# Patient Record
Sex: Female | Born: 1970 | Race: White | Hispanic: No | Smoking: Former smoker
Health system: Southern US, Community
[De-identification: ages and names within clinical notes are randomized; demographics above are authoritative.]

## PROBLEM LIST (undated history)

## (undated) DIAGNOSIS — E119 Type 2 diabetes mellitus without complications: Secondary | ICD-10-CM

## (undated) DIAGNOSIS — H543 Unqualified visual loss, both eyes: Secondary | ICD-10-CM

## (undated) DIAGNOSIS — E785 Hyperlipidemia, unspecified: Secondary | ICD-10-CM

## (undated) DIAGNOSIS — Z794 Long term (current) use of insulin: Secondary | ICD-10-CM

## (undated) DIAGNOSIS — H3500 Unspecified background retinopathy: Secondary | ICD-10-CM

## (undated) DIAGNOSIS — IMO0001 Reserved for inherently not codable concepts without codable children: Secondary | ICD-10-CM

## (undated) DIAGNOSIS — H409 Unspecified glaucoma: Secondary | ICD-10-CM

## (undated) DIAGNOSIS — C801 Malignant (primary) neoplasm, unspecified: Secondary | ICD-10-CM

## (undated) DIAGNOSIS — K3184 Gastroparesis: Secondary | ICD-10-CM

## (undated) DIAGNOSIS — I10 Essential (primary) hypertension: Secondary | ICD-10-CM

## (undated) DIAGNOSIS — G930 Cerebral cysts: Secondary | ICD-10-CM

## (undated) DIAGNOSIS — G629 Polyneuropathy, unspecified: Secondary | ICD-10-CM

## (undated) HISTORY — DX: Type 2 diabetes mellitus without complications: E11.9

## (undated) HISTORY — DX: Malignant (primary) neoplasm, unspecified: C80.1

## (undated) HISTORY — PX: EYE SURGERY: SHX253

## (undated) HISTORY — DX: Long term (current) use of insulin: Z79.4

## (undated) HISTORY — DX: Hyperlipidemia, unspecified: E78.5

## (undated) HISTORY — DX: Reserved for inherently not codable concepts without codable children: IMO0001

## (undated) HISTORY — DX: Gastroparesis: K31.84

## (undated) HISTORY — DX: Polyneuropathy, unspecified: G62.9

## (undated) HISTORY — DX: Unspecified glaucoma: H40.9

## (undated) HISTORY — DX: Unspecified background retinopathy: H35.00

## (undated) HISTORY — DX: Essential (primary) hypertension: I10

## (undated) HISTORY — DX: Unqualified visual loss, both eyes: H54.3

## (undated) HISTORY — PX: ABDOMINAL HYSTERECTOMY: SHX81

---

## 1997-11-16 ENCOUNTER — Encounter: Admission: RE | Admit: 1997-11-16 | Discharge: 1998-02-14 | Payer: Self-pay | Admitting: Obstetrics

## 1997-11-22 ENCOUNTER — Encounter: Admission: RE | Admit: 1997-11-22 | Discharge: 1998-02-20 | Payer: Self-pay | Admitting: Obstetrics & Gynecology

## 1998-01-02 ENCOUNTER — Observation Stay (HOSPITAL_COMMUNITY): Admission: AD | Admit: 1998-01-02 | Discharge: 1998-01-02 | Payer: Self-pay | Admitting: Obstetrics & Gynecology

## 1998-01-18 ENCOUNTER — Ambulatory Visit (HOSPITAL_COMMUNITY): Admission: RE | Admit: 1998-01-18 | Discharge: 1998-01-18 | Payer: Self-pay | Admitting: Unknown Physician Specialty

## 1998-01-30 ENCOUNTER — Inpatient Hospital Stay (HOSPITAL_COMMUNITY): Admission: AD | Admit: 1998-01-30 | Discharge: 1998-01-30 | Payer: Self-pay | Admitting: *Deleted

## 1998-01-31 ENCOUNTER — Ambulatory Visit (HOSPITAL_COMMUNITY): Admission: RE | Admit: 1998-01-31 | Discharge: 1998-01-31 | Payer: Self-pay | Admitting: Obstetrics & Gynecology

## 1998-02-20 ENCOUNTER — Ambulatory Visit (HOSPITAL_COMMUNITY): Admission: RE | Admit: 1998-02-20 | Discharge: 1998-02-20 | Payer: Self-pay | Admitting: *Deleted

## 1998-02-21 ENCOUNTER — Encounter: Admission: RE | Admit: 1998-02-21 | Discharge: 1998-05-22 | Payer: Self-pay | Admitting: Obstetrics & Gynecology

## 1998-02-28 ENCOUNTER — Encounter: Admission: RE | Admit: 1998-02-28 | Discharge: 1998-02-28 | Payer: Self-pay | Admitting: Obstetrics & Gynecology

## 1998-03-10 ENCOUNTER — Ambulatory Visit (HOSPITAL_COMMUNITY): Admission: RE | Admit: 1998-03-10 | Discharge: 1998-03-10 | Payer: Self-pay | Admitting: Obstetrics & Gynecology

## 1998-03-14 ENCOUNTER — Encounter: Admission: RE | Admit: 1998-03-14 | Discharge: 1998-03-14 | Payer: Self-pay | Admitting: Obstetrics & Gynecology

## 1998-03-21 ENCOUNTER — Encounter: Admission: RE | Admit: 1998-03-21 | Discharge: 1998-03-21 | Payer: Self-pay | Admitting: Obstetrics & Gynecology

## 1998-03-28 ENCOUNTER — Inpatient Hospital Stay (HOSPITAL_COMMUNITY): Admission: AD | Admit: 1998-03-28 | Discharge: 1998-03-28 | Payer: Self-pay | Admitting: Obstetrics

## 1998-04-04 ENCOUNTER — Encounter: Admission: RE | Admit: 1998-04-04 | Discharge: 1998-04-04 | Payer: Self-pay | Admitting: Obstetrics & Gynecology

## 1998-04-05 ENCOUNTER — Ambulatory Visit (HOSPITAL_COMMUNITY): Admission: RE | Admit: 1998-04-05 | Discharge: 1998-04-05 | Payer: Self-pay | Admitting: Obstetrics & Gynecology

## 1998-04-18 ENCOUNTER — Encounter: Admission: RE | Admit: 1998-04-18 | Discharge: 1998-04-18 | Payer: Self-pay | Admitting: Obstetrics & Gynecology

## 1998-04-25 ENCOUNTER — Encounter: Admission: RE | Admit: 1998-04-25 | Discharge: 1998-04-25 | Payer: Self-pay | Admitting: Obstetrics & Gynecology

## 1998-05-09 ENCOUNTER — Encounter: Admission: RE | Admit: 1998-05-09 | Discharge: 1998-05-09 | Payer: Self-pay | Admitting: Obstetrics & Gynecology

## 1998-05-11 ENCOUNTER — Ambulatory Visit (HOSPITAL_COMMUNITY): Admission: RE | Admit: 1998-05-11 | Discharge: 1998-05-11 | Payer: Self-pay

## 1998-05-16 ENCOUNTER — Encounter: Admission: RE | Admit: 1998-05-16 | Discharge: 1998-05-16 | Payer: Self-pay | Admitting: Obstetrics & Gynecology

## 1998-05-23 ENCOUNTER — Inpatient Hospital Stay (HOSPITAL_COMMUNITY): Admission: AD | Admit: 1998-05-23 | Discharge: 1998-05-23 | Payer: Self-pay | Admitting: *Deleted

## 1998-05-30 ENCOUNTER — Encounter (HOSPITAL_COMMUNITY): Admission: RE | Admit: 1998-05-30 | Discharge: 1998-06-18 | Payer: Self-pay | Admitting: Obstetrics & Gynecology

## 1998-05-30 ENCOUNTER — Encounter: Admission: RE | Admit: 1998-05-30 | Discharge: 1998-05-30 | Payer: Self-pay | Admitting: Obstetrics & Gynecology

## 1998-06-01 ENCOUNTER — Encounter: Payer: Self-pay | Admitting: Obstetrics & Gynecology

## 1998-06-01 ENCOUNTER — Ambulatory Visit (HOSPITAL_COMMUNITY): Admission: RE | Admit: 1998-06-01 | Discharge: 1998-06-01 | Payer: Self-pay | Admitting: Obstetrics & Gynecology

## 1998-06-08 ENCOUNTER — Ambulatory Visit (HOSPITAL_COMMUNITY): Admission: RE | Admit: 1998-06-08 | Discharge: 1998-06-08 | Payer: Self-pay | Admitting: Obstetrics & Gynecology

## 1998-06-13 ENCOUNTER — Encounter: Admission: RE | Admit: 1998-06-13 | Discharge: 1998-06-13 | Payer: Self-pay | Admitting: Obstetrics & Gynecology

## 1998-06-17 ENCOUNTER — Inpatient Hospital Stay (HOSPITAL_COMMUNITY): Admission: AD | Admit: 1998-06-17 | Discharge: 1998-06-20 | Payer: Self-pay | Admitting: *Deleted

## 1999-11-28 ENCOUNTER — Ambulatory Visit (HOSPITAL_COMMUNITY): Admission: RE | Admit: 1999-11-28 | Discharge: 1999-11-28 | Payer: Self-pay | Admitting: *Deleted

## 1999-11-28 ENCOUNTER — Encounter: Payer: Self-pay | Admitting: Family Medicine

## 2000-11-04 ENCOUNTER — Emergency Department (HOSPITAL_COMMUNITY): Admission: EM | Admit: 2000-11-04 | Discharge: 2000-11-04 | Payer: Self-pay | Admitting: *Deleted

## 2001-06-25 ENCOUNTER — Emergency Department (HOSPITAL_COMMUNITY): Admission: EM | Admit: 2001-06-25 | Discharge: 2001-06-25 | Payer: Self-pay | Admitting: Emergency Medicine

## 2001-06-25 ENCOUNTER — Encounter: Payer: Self-pay | Admitting: Emergency Medicine

## 2001-07-14 ENCOUNTER — Encounter (HOSPITAL_COMMUNITY): Admission: RE | Admit: 2001-07-14 | Discharge: 2001-08-13 | Payer: Self-pay | Admitting: Family Medicine

## 2002-03-08 ENCOUNTER — Encounter: Payer: Self-pay | Admitting: *Deleted

## 2002-03-08 ENCOUNTER — Emergency Department (HOSPITAL_COMMUNITY): Admission: EM | Admit: 2002-03-08 | Discharge: 2002-03-09 | Payer: Self-pay | Admitting: *Deleted

## 2003-09-19 ENCOUNTER — Emergency Department (HOSPITAL_COMMUNITY): Admission: EM | Admit: 2003-09-19 | Discharge: 2003-09-19 | Payer: Self-pay | Admitting: Emergency Medicine

## 2003-11-17 ENCOUNTER — Ambulatory Visit (HOSPITAL_COMMUNITY): Admission: RE | Admit: 2003-11-17 | Discharge: 2003-11-17 | Payer: Self-pay | Admitting: Family Medicine

## 2004-12-06 ENCOUNTER — Encounter: Admission: RE | Admit: 2004-12-06 | Discharge: 2004-12-06 | Payer: Self-pay | Admitting: Family Medicine

## 2005-03-18 ENCOUNTER — Ambulatory Visit (HOSPITAL_COMMUNITY): Admission: RE | Admit: 2005-03-18 | Discharge: 2005-03-18 | Payer: Self-pay | Admitting: Family Medicine

## 2005-08-06 ENCOUNTER — Ambulatory Visit (HOSPITAL_COMMUNITY): Admission: RE | Admit: 2005-08-06 | Discharge: 2005-08-06 | Payer: Self-pay | Admitting: Family Medicine

## 2005-09-17 ENCOUNTER — Emergency Department (HOSPITAL_COMMUNITY): Admission: EM | Admit: 2005-09-17 | Discharge: 2005-09-17 | Payer: Self-pay | Admitting: Emergency Medicine

## 2005-09-29 ENCOUNTER — Ambulatory Visit (HOSPITAL_COMMUNITY): Admission: RE | Admit: 2005-09-29 | Discharge: 2005-09-29 | Payer: Self-pay | Admitting: Family Medicine

## 2005-11-21 ENCOUNTER — Emergency Department (HOSPITAL_COMMUNITY): Admission: EM | Admit: 2005-11-21 | Discharge: 2005-11-21 | Payer: Self-pay | Admitting: Emergency Medicine

## 2006-06-03 ENCOUNTER — Ambulatory Visit (HOSPITAL_COMMUNITY): Admission: RE | Admit: 2006-06-03 | Discharge: 2006-06-03 | Payer: Self-pay | Admitting: Family Medicine

## 2006-08-13 ENCOUNTER — Ambulatory Visit (HOSPITAL_COMMUNITY): Admission: RE | Admit: 2006-08-13 | Discharge: 2006-08-13 | Payer: Self-pay | Admitting: Family Medicine

## 2006-08-28 ENCOUNTER — Ambulatory Visit (HOSPITAL_COMMUNITY): Admission: RE | Admit: 2006-08-28 | Discharge: 2006-08-28 | Payer: Self-pay | Admitting: Family Medicine

## 2006-10-06 ENCOUNTER — Ambulatory Visit (HOSPITAL_COMMUNITY): Admission: RE | Admit: 2006-10-06 | Discharge: 2006-10-06 | Payer: Self-pay | Admitting: Obstetrics & Gynecology

## 2006-12-08 ENCOUNTER — Encounter (HOSPITAL_COMMUNITY): Admission: RE | Admit: 2006-12-08 | Discharge: 2007-01-07 | Payer: Self-pay | Admitting: Orthopaedic Surgery

## 2007-01-04 ENCOUNTER — Encounter
Admission: RE | Admit: 2007-01-04 | Discharge: 2007-04-04 | Payer: Self-pay | Admitting: Physical Medicine & Rehabilitation

## 2007-01-05 ENCOUNTER — Ambulatory Visit: Payer: Self-pay | Admitting: Physical Medicine & Rehabilitation

## 2007-02-23 ENCOUNTER — Ambulatory Visit: Payer: Self-pay | Admitting: Physical Medicine & Rehabilitation

## 2007-04-01 ENCOUNTER — Ambulatory Visit: Payer: Self-pay | Admitting: Physical Medicine & Rehabilitation

## 2007-04-30 ENCOUNTER — Ambulatory Visit: Payer: Self-pay | Admitting: Physical Medicine & Rehabilitation

## 2007-04-30 ENCOUNTER — Encounter
Admission: RE | Admit: 2007-04-30 | Discharge: 2007-05-03 | Payer: Self-pay | Admitting: Physical Medicine & Rehabilitation

## 2007-05-31 ENCOUNTER — Encounter
Admission: RE | Admit: 2007-05-31 | Discharge: 2007-08-29 | Payer: Self-pay | Admitting: Physical Medicine & Rehabilitation

## 2007-05-31 ENCOUNTER — Ambulatory Visit: Payer: Self-pay | Admitting: Physical Medicine & Rehabilitation

## 2007-07-02 ENCOUNTER — Ambulatory Visit: Payer: Self-pay | Admitting: Physical Medicine & Rehabilitation

## 2007-08-13 ENCOUNTER — Ambulatory Visit: Payer: Self-pay | Admitting: Physical Medicine & Rehabilitation

## 2007-08-13 ENCOUNTER — Encounter
Admission: RE | Admit: 2007-08-13 | Discharge: 2007-11-11 | Payer: Self-pay | Admitting: Physical Medicine & Rehabilitation

## 2007-09-02 ENCOUNTER — Ambulatory Visit: Payer: Self-pay | Admitting: Physical Medicine & Rehabilitation

## 2007-09-07 ENCOUNTER — Ambulatory Visit (HOSPITAL_COMMUNITY)
Admission: RE | Admit: 2007-09-07 | Discharge: 2007-09-07 | Payer: Self-pay | Admitting: Physical Medicine & Rehabilitation

## 2007-09-30 ENCOUNTER — Ambulatory Visit: Payer: Self-pay | Admitting: Physical Medicine & Rehabilitation

## 2007-10-14 ENCOUNTER — Encounter (HOSPITAL_COMMUNITY)
Admission: RE | Admit: 2007-10-14 | Discharge: 2007-11-13 | Payer: Self-pay | Admitting: Physical Medicine and Rehabilitation

## 2007-10-26 ENCOUNTER — Ambulatory Visit: Payer: Self-pay | Admitting: Physical Medicine & Rehabilitation

## 2007-11-22 ENCOUNTER — Encounter
Admission: RE | Admit: 2007-11-22 | Discharge: 2008-02-20 | Payer: Self-pay | Admitting: Physical Medicine & Rehabilitation

## 2007-11-23 ENCOUNTER — Ambulatory Visit: Payer: Self-pay | Admitting: Physical Medicine & Rehabilitation

## 2007-12-21 ENCOUNTER — Ambulatory Visit: Payer: Self-pay | Admitting: Physical Medicine & Rehabilitation

## 2008-01-20 ENCOUNTER — Ambulatory Visit: Payer: Self-pay | Admitting: Physical Medicine & Rehabilitation

## 2008-02-17 ENCOUNTER — Encounter
Admission: RE | Admit: 2008-02-17 | Discharge: 2008-05-17 | Payer: Self-pay | Admitting: Physical Medicine & Rehabilitation

## 2008-02-21 ENCOUNTER — Ambulatory Visit: Payer: Self-pay | Admitting: Physical Medicine & Rehabilitation

## 2008-03-21 ENCOUNTER — Ambulatory Visit: Payer: Self-pay | Admitting: Physical Medicine & Rehabilitation

## 2008-03-23 ENCOUNTER — Ambulatory Visit (HOSPITAL_COMMUNITY)
Admission: RE | Admit: 2008-03-23 | Discharge: 2008-03-23 | Payer: Self-pay | Admitting: Physical Medicine & Rehabilitation

## 2008-04-18 ENCOUNTER — Ambulatory Visit: Payer: Self-pay | Admitting: Physical Medicine & Rehabilitation

## 2008-06-12 ENCOUNTER — Encounter
Admission: RE | Admit: 2008-06-12 | Discharge: 2008-09-10 | Payer: Self-pay | Admitting: Physical Medicine & Rehabilitation

## 2008-06-13 ENCOUNTER — Ambulatory Visit: Payer: Self-pay | Admitting: Physical Medicine & Rehabilitation

## 2008-08-08 ENCOUNTER — Ambulatory Visit: Payer: Self-pay | Admitting: Physical Medicine & Rehabilitation

## 2008-09-11 ENCOUNTER — Ambulatory Visit (HOSPITAL_COMMUNITY): Admission: RE | Admit: 2008-09-11 | Discharge: 2008-09-12 | Payer: Self-pay | Admitting: Obstetrics and Gynecology

## 2008-09-11 ENCOUNTER — Encounter: Payer: Self-pay | Admitting: Obstetrics and Gynecology

## 2008-09-14 ENCOUNTER — Emergency Department (HOSPITAL_COMMUNITY): Admission: EM | Admit: 2008-09-14 | Discharge: 2008-09-14 | Payer: Self-pay | Admitting: Emergency Medicine

## 2008-10-27 ENCOUNTER — Encounter
Admission: RE | Admit: 2008-10-27 | Discharge: 2008-10-31 | Payer: Self-pay | Admitting: Physical Medicine & Rehabilitation

## 2008-10-31 ENCOUNTER — Ambulatory Visit: Payer: Self-pay | Admitting: Physical Medicine & Rehabilitation

## 2009-01-22 ENCOUNTER — Encounter
Admission: RE | Admit: 2009-01-22 | Discharge: 2009-01-23 | Payer: Self-pay | Admitting: Physical Medicine & Rehabilitation

## 2009-01-23 ENCOUNTER — Ambulatory Visit: Payer: Self-pay | Admitting: Physical Medicine & Rehabilitation

## 2009-04-17 ENCOUNTER — Ambulatory Visit: Payer: Self-pay | Admitting: Physical Medicine & Rehabilitation

## 2009-04-17 ENCOUNTER — Encounter
Admission: RE | Admit: 2009-04-17 | Discharge: 2009-05-17 | Payer: Self-pay | Admitting: Physical Medicine & Rehabilitation

## 2009-04-23 ENCOUNTER — Ambulatory Visit (HOSPITAL_COMMUNITY)
Admission: RE | Admit: 2009-04-23 | Discharge: 2009-04-23 | Payer: Self-pay | Admitting: Physical Medicine & Rehabilitation

## 2009-11-19 ENCOUNTER — Ambulatory Visit (HOSPITAL_COMMUNITY): Admission: RE | Admit: 2009-11-19 | Discharge: 2009-11-19 | Payer: Self-pay | Admitting: Family Medicine

## 2010-05-02 ENCOUNTER — Ambulatory Visit: Payer: Self-pay | Admitting: Internal Medicine

## 2010-06-16 ENCOUNTER — Encounter: Payer: Self-pay | Admitting: Family Medicine

## 2010-09-04 LAB — HEMOGLOBIN A1C: Mean Plasma Glucose: 249 mg/dL

## 2010-09-04 LAB — DIFFERENTIAL
Basophils Absolute: 0 10*3/uL (ref 0.0–0.1)
Basophils Relative: 0 % (ref 0–1)
Basophils Relative: 0 % (ref 0–1)
Eosinophils Absolute: 0.1 10*3/uL (ref 0.0–0.7)
Eosinophils Absolute: 0.2 10*3/uL (ref 0.0–0.7)
Eosinophils Relative: 2 % (ref 0–5)
Lymphs Abs: 1 10*3/uL (ref 0.7–4.0)
Lymphs Abs: 2.1 10*3/uL (ref 0.7–4.0)
Monocytes Relative: 6 % (ref 3–12)
Neutro Abs: 8.9 10*3/uL — ABNORMAL HIGH (ref 1.7–7.7)
Neutrophils Relative %: 76 % (ref 43–77)

## 2010-09-04 LAB — COMPREHENSIVE METABOLIC PANEL
ALT: 15 U/L (ref 0–35)
BUN: 17 mg/dL (ref 6–23)
CO2: 26 mEq/L (ref 19–32)
Chloride: 104 mEq/L (ref 96–112)
Creatinine, Ser: 0.42 mg/dL (ref 0.4–1.2)
GFR calc Af Amer: >60 mL/min (ref >60)
GFR calc non Af Amer: >60 mL/min (ref >60)

## 2010-09-04 LAB — CBC
HCT: 33.2 % — ABNORMAL LOW (ref 36.0–46.0)
HCT: 40.1 % (ref 36.0–46.0)
Hemoglobin: 14.1 g/dL (ref 12.0–15.0)
MCHC: 34.8 g/dL (ref 30.0–36.0)
MCV: 85.7 fL (ref 78.0–100.0)
MCV: 86.1 fL (ref 78.0–100.0)
MCV: 86.5 fL (ref 78.0–100.0)
Platelets: 188 10*3/uL (ref 150–400)
Platelets: 197 10*3/uL (ref 150–400)
Platelets: 200 10*3/uL (ref 150–400)
RBC: 4.06 MIL/uL (ref 3.87–5.11)
RDW: 11.9 % (ref 11.5–15.5)
WBC: 11.7 10*3/uL — ABNORMAL HIGH (ref 4.0–10.5)

## 2010-09-04 LAB — GLUCOSE, CAPILLARY
Glucose-Capillary: 106 mg/dL — ABNORMAL HIGH (ref 70–99)
Glucose-Capillary: 122 mg/dL — ABNORMAL HIGH (ref 70–99)
Glucose-Capillary: 148 mg/dL — ABNORMAL HIGH (ref 70–99)
Glucose-Capillary: 167 mg/dL — ABNORMAL HIGH (ref 70–99)
Glucose-Capillary: 188 mg/dL — ABNORMAL HIGH (ref 70–99)

## 2010-09-04 LAB — BASIC METABOLIC PANEL
BUN: 11 mg/dL (ref 6–23)
BUN: 5 mg/dL — ABNORMAL LOW (ref 6–23)
CO2: 28 mEq/L (ref 19–32)
Calcium: 8.6 mg/dL (ref 8.4–10.5)
Chloride: 104 mEq/L (ref 96–112)
Chloride: 105 mEq/L (ref 96–112)
Creatinine, Ser: 0.43 mg/dL (ref 0.4–1.2)
Creatinine, Ser: 0.5 mg/dL (ref 0.4–1.2)
GFR calc Af Amer: >60 mL/min (ref >60)
Glucose, Bld: 121 mg/dL — ABNORMAL HIGH (ref 70–99)
Potassium: 3.7 mEq/L (ref 3.5–5.1)

## 2010-09-04 LAB — URINALYSIS, ROUTINE W REFLEX MICROSCOPIC
Bilirubin Urine: NEGATIVE
Ketones, ur: NEGATIVE mg/dL
Leukocytes, UA: NEGATIVE
Nitrite: NEGATIVE
Specific Gravity, Urine: 1.015 (ref 1.005–1.030)
Urobilinogen, UA: 0.2 mg/dL (ref 0.0–1.0)

## 2010-09-04 LAB — TYPE AND SCREEN
ABO/RH(D): B POS
Antibody Screen: NEGATIVE

## 2010-09-04 LAB — CULTURE, BLOOD (ROUTINE X 2)

## 2010-10-08 NOTE — Op Note (Signed)
Allison Allison [name], Allison [name]              ACCOUNT NO.:  [id number]   MEDICAL RECORD NO.:  [id number]          PATIENT TYPE:  OIB   LOCATION:  A335                          FACILITY:  [facility]   PHYSICIAN:  Tilda Burrow, M.D. DATE OF BIRTH:  Jan 02, 1971   DATE OF PROCEDURE:  09/11/2008  DATE OF DISCHARGE:                               OPERATIVE REPORT   PREOPERATIVE DIAGNOSES:  1. Recurrent cervical dysplasia status post failed loop      electrosurgical excision procedure.  2. Rectocele.  3. Dyspareunia.  4. Dysmenorrhea.  5. Diabetes mellitus, poorly controlled.   POSTOPERATIVE DIAGNOSES:  1. Recurrent cervical dysplasia status post failed loop      electrosurgical excision procedure.  2. Rectocele.  3. Dyspareunia.  4. Dysmenorrhea.  5. Diabetes mellitus, poorly controlled.   PROCEDURE:  Vaginal hysterectomy, posterior repair.   SURGEON:  Tilda Burrow, MD   ASSISTANTAmie Allison [name], CST, Kendrick RN   ANESTHESIA:  General.   FINDINGS:  First-degree uterine synthesis, normal ovaries, fibrotic  cervix status post prior LEEP prevailing consistent with failed LEEP in  the office, moderate rectocele.   ESTIMATED BLOOD LOSS:  50 mL.   DETAILS OF PROCEDURE:  The patient was taken to the operating room,  prepped and draped with the legs in high-lithotomy support.  The patient  was placed in this position prior to induction of anesthesia to ensure  that the back was not bothering the patient in this position.  A time-  out was conducted and confirmed by all parties.  IV preoperative  antibiotic prophylaxis initiated.  After prepping and draping, the  colpotomy incision was performed posterior to the vagina after the  cervix was circumscribed with Marcaine with epi and then the colpotomy  incision performed without difficulty.  Colpotomy was performed without  difficulty.  The Zeppelin clamps used to cross-clamp the uterosacral  ligaments on each side which were transected with Mayo  scissors, sutured  with 0 chromic and tagged.  Lower cardinal ligaments were serially  clamped, cut, and suture ligated on either side with bladder flap pushed  upward slightly.  As we marched up, the upper cardinal ligaments in  small bites clamping, cutting, and suture ligating.  The bladder flap  was regressively pushed upward.  At this time, the anterior  vesicouterine reflection of peritoneum could be identified and opened.  The remainder of the broad ligament was serially clamped, cut, and  suture ligated using small bites of tissue, Mayo scissor transection,  and 0 chromic suture ligature.  Upon reaching the level of the utero-  ovarian ligament and fallopian tube pedicle could be cross-clamped.  The  uterus amputated, removed, and then the double ligature of the utero-  ovarian pedicle performed.  Inspection of the pedicles revealed  excellent hemostasis.  A small suture of 2-0 Prolene was placed,  catching internal edge of each uterosacral ligament and tying them  through the pouch of Douglas in a loose fashion to reduce potential for  enterocele.  The peritoneum beneath the bladder flap was pulled to the  posterior cul-de-sac to close the peritoneum.  Sponge and needle counts  were correct.  The vaginal cuff was closed with interrupted 0 chromic  sutures with the axis of the cuff closures being side-to-side.  Minimal  bleeding was encountered in this portion of the procedure.   Posterior repair involved grasping the hymen remnants with Allis clamps,  opening the posterior vaginal mucosa with injection approximately 8 mL  of Marcaine with epinephrine into the rectovaginal septum to improve  hemostasis.  The vaginal epithelium was easily dissected off the  underlying tissue and Allis clamps were used to grasp lateral support  tissues which could be pulled in the midline and sutured with 2  interrupted sutures of 0 Vicryl.  The perineal body was sutured up to  these sutures in  order to reinforce the perineal body and to elevate it.  The vaginal epithelium was trimmed and then reapproximated with 2-0  chromic.  Vaginal packing was placed using Betadine-soaked 2-inch tape  and Foley catheter inserted revealing clear urine.  Sponge and needle  counts were correct.   ADDENDUM:  The patient's hemoglobin A1c preoperatively was 10.3.      Tilda Burrow, M.D.  Electronically Signed     JVF/MEDQ  D:  09/11/2008  T:  09/12/2008  Job:  [id number]   cc:   Reather Littler, M.D.  Fax: [phone]   Lia Hopping  Fax: [phone]

## 2010-10-08 NOTE — Assessment & Plan Note (Signed)
A 40 year old female with low back and lower extremity pain has tried  medial branch blocks.  CT of the lumbar spine demonstrated L4-5 and L5-  S1 facet arthropathy.  She had no significant diskogenic disease.  No  foraminal stenosis.  No central stenosis.   INTERVAL MEDICAL HISTORY:  She has had a grade 3 Pap smear scheduled for  LEEP procedure.  She does have some abdominal pain along with her back  pain now.  Her procedure is scheduled on July 05, 2008.   She has had no other new complaints.  She does have some left lower  extremity pain and tingling at times, but has had a normal EMG in the  past.   REVIEW OF SYSTEMS:  Positive constipation, abdominal pain, weight gain,  limb swelling, sleep problems, numbness, tingling, and trouble walking.  She was last employed August 07, 2004.  She needs assist with meal prep,  household duties, shopping, and occasionally bathing and dressing.  She  can walk 10 minutes at time.  She climbs steps.  She drives.  Her pain  levels in the 8-9 range.  She has about a 30% improvement of her pain  with medication.   SOCIAL HISTORY:  Divorced.  Lives with her 12-year-old son.  Her mother  is with her today.   PHYSICAL EXAMINATION:  VITAL SIGNS:  Blood pressure 139/91, the patient  told to follow up with a primary care on this.  Pulse 95, respirations  18, and 02 sat 96% room air.  GENERAL:  No acute distress.  Mood and affect appropriate.  Gait is  normal.  EXTREMITIES:  Without edema.  Coordination of normal deep tendon  reflexes, normal arm at deep tendon reflexes, 1+ bilateral knees and  ankles.  Lower extremity strength is normal.  BACK:  Mild tenderness to palpation, lumbosacral junction, lumbar range  of motion about 50% in forward flexion, extension, lateral rotation, and  bending.   IMPRESSION:  Chronic low back pain, left lower extremity pain, and  lumbar facet arthropathy not relieved with medial branch blocks.   PLAN:  We will  continue current medications which include hydrocodone  10/325 five times per day as well as Topamax 25 nightly, which helps her  rest in night.  I will see her back in about 2 months.  If there is any  additional pain related to procedure, may need to temporarily increase  her pain medicine.      Erick Colace, M.D.  Electronically Signed     AEK/MedQ  D:  06/13/2008 09:32:26  T:  06/13/2008 20:24:18  Job #:  [id number]   cc:   Tilda Burrow, M.D.  Fax: [phone]

## 2010-10-08 NOTE — Assessment & Plan Note (Signed)
REASON FOR VISIT:  Left lower extremity pain with back pain.   The patient is a 40 year old female with fully controlled diabetes.  She  has low back pain, which has had relieved with medial branch blocks in  the past; however, no relief after repeat done about 2 months ago.  She  has had problems with left lower extremity, type of give way.  She has x-  rays of her knee showing no significant OA.  She has had no joint  effusions to suggest an intraarticular abnormality and her left knee  joint is stable on clinical testing.   She can walk 10 minutes at a time.  She can climb steps.  She can drive.  She needs some assist with meal prep, household duties, shopping,  dressing, and bathing even at times.   REVIEW OF SYSTEMS:  Positive for numbness, tingling, left lower  extremity trouble walking, anxiety, weight gain.  She has had some blood  sugar regulation problems.  She has abnormal pain, limb swelling,  although none apparent today, as well as sleep problems, but not really  apnea.   SOCIAL HISTORY:  Divorcee.  Lives with her son.  Still smokes a pack a  day.   PHYSICAL EXAMINATION:  VITAL SIGNS:  Blood pressure 143/95, pulse 80,  respiratory rate 18, O2 sat 100% on room air.  GENERAL:  Well-developed, well-nourished female in no acute distress.  Orientation x3.  Affect alert.  EXTREMITIES:  Gait is normal.  She is able toe walk and heel walk.  Coordination normal in the upper and lower extremities.  Deep tendon  reflexes are 1+ in the ankles and 2+ at the knees.  She has no sensory  deficits on pinprick testing in the lower extremities.  She has no  evidence of fasciculation.  No evidence of intrinsic atrophy or foot  deformities.  Her skin is clear in the lower extremities.   Motor strength is 5/5 in bilateral lower extremities.   IMPRESSION:  Chronic low back pain, left lower extremity pain, not  clearly radicular, however, has some sciatic qualities.  She has had a  normal  EMG in the left lower extremity.  Given that she has had a fall,  I think further diagnostic testing would be helpful.  She states she  does not tolerate MRI, but can tolerate a CAT scan, and will have CT  scan of the lumbar spine to look for any signs of foraminal stenosis or  any other kind of nerve root impingement.   I will see her back after the diagnostic testing further assessed.  In  the meantime, we will continue hydrocodone 10/325 up to 5 times a day,  and Topamax, we will reduce to 25 nightly given that she has some  hangover effect with 50 mg dosage.      Erick Colace, M.D.  Electronically Signed     AEK/MedQ  D:  03/21/2008 10:08:18  T:  03/21/2008 21:54:52  Job #:  [id number]   cc:   Angus G. Renard Matter, MD  Fax: [phone]

## 2010-10-08 NOTE — Assessment & Plan Note (Signed)
[name] returns today.  She had a medial branch block of the lumbar  spine, dated March 18, 2007.  She went from a 7/10 to a 0/10, and that  lasted a couple of days.  Similar relief with prior injection done one  month earlier.  She initially stated that her medial branch block was  not helpful but after further questioning, it was revealed that she did  have 100% relief in a couple of days.   Her pain with activity is still at an 8/10 level.  Her average pain is  an 8-9/10 level.  Sleep is poor, related to her back pain.  She does  have some left lower extremity pain.  She can walk 10 minutes at a time.  She drives.  She has some difficulty with meal prep, hospital duties,  and shopping due to her back pain.  Review of systems is positive for  weakness, numbness, tingling, constipation, anxiety, and weight gain.   She continues to see Dr. Butch Penny, her primary care.   SOCIAL HISTORY:  She is divorced.  Lives with her 38 year old son.  Smokes a pack a day of cigarettes.   PHYSICAL EXAMINATION:  VITAL SIGNS:  Blood pressure 116/79, pulse 93,  respiratory rate 18, O2 sat 97% on room air.  Her back has some tenderness in the lumbosacral junction, increasing  with extension of her lumbar spine as well as extension and twisting of  her lumbar spine.  She has normal strength in her lower extremities,  normal deep tendon reflexes, normal sensation, and normal range of  motion of all extremities.   IMPRESSION:  1. Lumbar facet syndrome:  She has had good relief x2 with medial      branch blocks.  Will set her up for lumbar radiofrequency      procedure.  2. Will continue current medications, including hydrocodone 10/500, up      to 5 tablets per day.  I have told her prior to the radiofrequency      procedure, she should take two of her Xanax, which are prescribed      by her primary care physician.      Erick Colace, M.D.  Electronically Signed     AEK/MedQ  D:   04/02/2007 16:42:10  T:  04/03/2007 13:48:37  Job #:  [id number]   cc:   Angus G. Renard Matter, MD  Fax: [phone]

## 2010-10-08 NOTE — Procedures (Signed)
Allison [name], Allison [name]              ACCOUNT NO.:  [id number]   MEDICAL RECORD NO.:  [id number]          PATIENT TYPE:  REC   LOCATION:  TPC                          FACILITY:  [facility]   PHYSICIAN:  Erick Colace, M.D.DATE OF BIRTH:  06-06-1970   DATE OF PROCEDURE:  DATE OF DISCHARGE:                               OPERATIVE REPORT   PROCEDURES:  Left L5 dorsal ramus injection, left L4 medial branch  block, and left L3 medial branch block under lumbar and sacral  fluoroscopic guidance.   INDICATIONS:  Lumbosacral spondylosis without myelopathy.  She has had  previous relief with radiofrequency neurotomy at these levels; however,  she has recurrence of pain approximately 8 months post-radiofrequency.  Pain is interfering with self-care mobility.  Only partially responsive  to medication management including narcotic and analgesic medications.   Informed consent was obtained after describing the risks and benefits of  the procedure with the patient.  These include bleeding, bruising,  infection, loss of bowel and bladder function, and temporary or  permanent paralysis.  She elects to proceed and has given written  consent.  The patient placed prone on fluoroscopy table.  Betadine prep,  sterile drape.  A 25-gauge 1-1/2-inch needle was used to anesthetize the  skin and subcu tissue with 1% lidocaine x2 mL, and a 22-gauge 3-1/2-inch  spinal needle was inserted first starting at left S1 SAP and sacroiliac  junction.  Bone contact made and confirmed with lateral imaging.  Omnipaque 180 x 0.5 mL demonstrated no intravascular uptake.  Then, a 1  mL of 2% MPF lidocaine injected.  Then, the left L5 SAP and transverse  process junction targeted.  Bone contact made and confirmed with lateral  imaging.  Omnipaque 180 x 0.5 mL demonstrated no intravascular uptake.  Then, 1 mL of 2% MPF lidocaine injected.  Then, the left L4 SAP and  transverse process junction targeted.  Bone contact made and  confirmed  with lateral imaging.  Omnipaque 180 x 0.5 mL demonstrated no  intravascular uptake.  Then, 1 mL of 2% MPF lidocaine solution was  injected.  The patient tolerated the procedure well.  Pre- and post-  injection vitals stable.  Post-injection instructions given.  Pre-  injection pain level 7/10.  Post-injection 5-6/10.  Given that it is not  a 50% relief, I would not proceed with repeat radiofrequency procedure.  I will see her back in 63-month followup.      Erick Colace, M.D.  Electronically Signed     AEK/MEDQ  D:  01/20/2008 09:22:33  T:  01/21/2008 00:35:26  Job:  [id number]

## 2010-10-08 NOTE — Assessment & Plan Note (Signed)
This is a 40 year old female with back and lower extremity pain.  CT of  lumbar spine showed L4-5, L5-S1 facet arthropathy, no significant  improvement with medial branch blocks.  She had undergone a hysterectomy  since I last saw her on August 08, 2008.  She has done well  postoperatively.  She received some pain medicines from her  gynecologist, has 17 of her hydrocodone left.  Her average pain is 8-  9/10, current pain is 7-8, interferes with activity at 6-7/10 level.  Sleep is poor.  Pain is worse with walking, bending, sitting, and  standing.  Improves with pacing activities, medications.  Relief from  meds is fair.  She can walk about 10 minutes at a time.  She climbs  steps.  She drives.  Last employed August 07, 2008.  She needs help with  meal prep, household duties, and shopping.   Review of systems positive for anxiety, trouble walking, numbness,  tingling, weakness, constipation using GlycoLax.   Past history is significant for diabetes, high blood pressure,  arthritis, and cancer which is cervical.   Blood pressure 120/81, pulse 84, respirations 18, O2 sat 96% on room  air.  General, in no acute distress.  Mood and affect appropriate. Back  has minor tenderness to palpation of lumbosacral spine.  Lower extremity  strength is full.  Pain is worse with extension.  She has good lumbar  flexion.   IMPRESSION:  Lumbar facet syndrome, chronic low back pain with  spondylosis.   PLAN:  We will continue current medications, which include hydrocodone  10/325 five times a day.  She will finish out her medication she  received from her surgeon in the postoperative period of time which I  have approved.  She will continue her GlycoLax for constipation.  We  will do urine drug screen next visit.      Erick Colace, M.D.  Electronically Signed     AEK/MedQ  D:  10/31/2008 16:44:52  T:  11/01/2008 09:55:18  Job #:  [id number]   cc:   Tilda Burrow, M.D.  Fax:  [phone]   Angus G. Renard Matter, MD  Fax: [phone]

## 2010-10-08 NOTE — Assessment & Plan Note (Signed)
HISTORY OF PRESENT ILLNESS:  [name] returns today.  She started  physical therapy.  She has had an improvement in her lumbar spine pain,  has had some flare-up in the left side of low back.  She thinks it  started when she got up from the couch.  Her pain score at last visit  was 6-7/10.   She has had no new medical problems in the interval time.  She has had  no lower extremity radicular complaints.   SOCIAL HISTORY:  Pursuing disability.  Divorced.  Lives with a 9-year-  old son.  Smokes pack-a-day.   PHYSICAL EXAMINATION:  GENERAL:  In no acute distress.  Mood and affect  appropriate.  BACK:  No tenderness to palpation.  Lumbar spine range of motion 75%  forward flexion and extension.  EXTREMITIES:  Normal range of motion.  Normal deep tendon reflexes.  Normal extremity swelling.  Gait is normal.  She had tenderness in the  left L5 region.  Faber's testing is negative on the left, positive in  the groin and the right.   IMPRESSION:  1. Lumbar facet syndrome status post radiofrequency procedure.  2. Muscle strain lumbar spine area.  3. Right hip pain, questionable whether she may have some early      degenerative joint disease.   PLAN:  1. We will go ahead and reduce her hydrocodone 10/325 q.i.d.  2. Start her on methocarbamol 500 mg nightly.  3. We have counseled on the interaction with multiple medications,      most notably Xanax, hydrocodone.  Goal will be also to further      reduce hydrocodone next month.  4. Consider trigger point injections to her back pain, if not get much      better with the methocarbamol.      Erick Colace, M.D.  Electronically Signed     AEK/MedQ  D:  11/23/2007 10:50:54  T:  11/24/2007 02:17:28  Job #:  [id number]

## 2010-10-08 NOTE — Assessment & Plan Note (Signed)
HISTORY:  [name] returns today.  She has had some problems with her  diabetic management since I last saw her, has switched to Byetta.  She  has had some numbness in her left leg greater than right leg.  She has  had prior evaluation including EMG/NCV which was basically normal,  performed on September 02, 2007.  She has had some increasing back pain,  mainly axial, sharp, burning, stabbing, tingling, and aching; improves  with pacing, activities, and medications.  Sleep is poor.  She can walk  10 minutes at a time.  She climbs steps sometimes.  She drives  sometimes.  She needs some assistance with meal prep, household duties,  and shopping.   REVIEW OF SYSTEMS:  Has some bowel constipation.  There is numbness and  tingling, left lower extremity, as noted above; trouble walking,  anxiety, blood sugar issues, and lower extremity swelling.  Other  concern is feels like her calves are swollen.   PAST MEDICAL HISTORY:  Diabetes, recently started on Byetta, poor  control.   SOCIAL HISTORY:  Divorced, lives with her 38-year-old son.   FAMILY HISTORY:  Diabetes, high blood pressure, and disability.   PHYSICAL EXAMINATION:  VITAL SIGNS:  Blood pressure 119/59, pulse 76,  and O2 sat 96% on room air.  GENERAL:  Well-developed, well-nourished female in no acute distress.  Orientation x3.  Affect is anxious.  Gait is normal.  MUSCULOSKELETAL:  Her upper and lower extremity strength is normal.  She  has tenderness in the lumbosacral junction, which increases with  extension compared with flexion.  She has normal range of motion,  bilateral hips.  Her calves are nontender. Thompson test is negative.  Her calf circumference is 37.5 cm right, 36.5 cm left.  Her sensation is  decreased to left S1 dermatomal distribution.   IMPRESSION:  Lumbar facet syndrome due to lumbar spondylosis.  Her last  radiofrequency procedure was performed approximately 8 months ago, and  it is likely that this is  starting to wear off.  I will repeat this on  the left side and consider doing right side as well.  We had been able  to reduce her hydrocodone to t.i.d. but over the last month had to  increase it to q.i.d. and this month will have to go back to 5 times per  day, as it had been prior to the radiofrequency.   We will first check medial branch blocks to make sure repeating  radiofrequency is worthwhile, and if it is not, and given the fact that  she has decreased left S1 dermatomal distribution, may need to check MRI  of the lumbar spine at that point.      Erick Colace, M.D.  Electronically Signed     AEK/MedQ  D:  12/21/2007 16:18:54  T:  12/22/2007 05:56:27  Job #:  [id number]

## 2010-10-08 NOTE — H&P (Signed)
Allison [name], Allison [name]              ACCOUNT NO.:  [id number]   MEDICAL RECORD NO.:  [id number]          PATIENT TYPE:  AMB   LOCATION:  DAY                           FACILITY:  [facility]   PHYSICIAN:  Tilda Burrow, M.D. DATE OF BIRTH:  02-Apr-1971   DATE OF ADMISSION:  09/11/2008  DATE OF DISCHARGE:  LH                              HISTORY & PHYSICAL   ADMITTING DIAGNOSES:  1. 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  2. Inability to repeat loop electrosurgical excision procedure in the      office due to cervical fibrosis.  3. First-degree uterine descensus.  4. Rectocele.  5. Mild stress incontinence, not requiring intervention.  6. Diabetes mellitus.   HISTORY OF PRESENT ILLNESS:  This is a 40 year old female gravida 1,  para 1, LMP July 25, 2008, and then August 13, 2008, is scheduled for  admission September 11, 2008, for vaginal hysterectomy and posterior repair.   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  We attempted LEEP conization of the  cervix in the office, but were unable to complete it due to the fibrotic  nature of the cervix status post laser vaporization.  Additionally, the  patient had complaints of dysmenorrhea, which were causing discomfort  and the patient has first-degree uterine descensus.  There is a marked  rectocele, which is interfering with identification.  She has a slow  bowel transit time not completely explained by the rectocele, but has  difficulty with straining due to firm bowel movement pellets, which are  difficult for her to evacuate.  She does require splinting.  She  describes her periods as heavy with cramps for 3 of the 4 days of the  cycle, quarter sized clots, requiring 1-1/2 boxes of tampons per cycle.  There is no  intermenstrual bleeding and no other abnormal bleeding.  Cervical biopsies have been performed, which showed high-grade  abnormalities on May 30, 2008.   MEDICAL HISTORY:  The patient has diabetes mellitus, which is difficult  to control.  She sees Dr. Reather Littler and has followup appointment on the  12th, next week for evaluation of her diabetes.  She describes her blood  sugar recently as being approximately 200 this morning.  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  She has history  of proliferative retinopathy and peripheral neuropathy as complications  of her type 1 diabetes.   MEDICATIONS:  Lantus 10 mg at bedtime 40 mg q.a.m. along with Humalog  p.r.n.   She has also complaints of osteoarthritis of the spine and is on  disability due to her medical illnesses.   Medical care is provided by Dr. Renard Matter, with primary care, Dr. Reather Littler, phone number [phone] for diabetic care, and she sees [facility]  [facility] [place].  [facility], sees Dr. Rudene Christians for eye care  for her proliferative retinopathy.  She has had 5 laser therapies on the  right eye and 5 on the left.  She is being evaluated for disability by  Dr. Lia Hopping with subsequent disability evaluation on April 29.   GENERAL:  Shows a somber female who appears alert and oriented and who  understands the proposed procedure and has had risks and benefits of  procedure reviewed and states understanding of the procedure using  Krames visual guides.  NECK:  Supple.  CHEST:  Clear to auscultation.  Normal thyroid.  CARDIAC:  Regular rate and rhythm.  ABDOMEN:  Moderate obesity without masses.  GU:  External genitalia shows mild rectocele, first-degree uterine  descensus, posteriorly oriented, cervix smooth, status post LEEP, but  somewhat fibrotic first-degree uterine descensus is present and moderate  rectocele.    PLAN:  Vaginal hysterectomy with posterior repair, September 11, 2008, the  patient is to discuss perioperative medications with Dr. Lucianne Muss and has  to have him send the information to our office that are faxed [phone].  We will provide a diabetic control, it can be adequately achieved, we  will proceed with vaginal hysterectomy, posterior repair under prolonged  antibiotic prophylaxis on September 11, 2008.      Tilda Burrow, M.D.  Electronically Signed     JVF/MEDQ  D:  09/01/2008  T:  09/02/2008  Job:  [id number]   cc:   Reather Littler, M.D.  Fax: [phone]   Lia Hopping  Fax: [phone]   [facility]

## 2010-10-08 NOTE — Procedures (Signed)
Allison [name], Allison [name]              ACCOUNT NO.:  [id number]   MEDICAL RECORD NO.:  [id number]          PATIENT TYPE:  REC   LOCATION:  [facility]                          FACILITY:  [facility]   PHYSICIAN:  Erick Colace, M.D.DATE OF BIRTH:  May 08, 1971   DATE OF PROCEDURE:  03/18/2007  DATE OF DISCHARGE:                               OPERATIVE REPORT   This is medial branch block, bilateral L5 dorsal ramus, bilateral L4  medial branch block, bilateral L3 medial branch under fluoroscopic  guidance.   INDICATIONS:  Lumbar facet mediated pain which interferes with meal  prep, household duties, shopping.  She has not been able to work due to  pain.  She had about two to three-week relief with the last set of  medial branch blocks, however, she states that the pain relief was  somewhere in the 25-30% range.  However, she was able to increase her  sitting tolerance and easier to get up from a chair and start walking.   Her pain persists despite medication management.   Informed consent was obtained after describing risks and benefits to the  patient.  These include bleeding, bruising, infection, loss of bowel or  bladder function, temporary or permanent paralysis, she elects proceed  and has given written consent.  The patient placed prone on fluoroscopy  table.  Betadine prep, sterile drape, 25-gauge inch and a half needle  was used to anesthetize the skin and subcu tissue, 1% lidocaine x2 mL  and a 22 gauge three and a half inch spinal needle was inserted, first  charting the left S1 SAP sacral ala junction.  Bone contact made,  confirmed with lateral imaging.  Omnipaque 180 x0.5 mL demonstrated no  intravascular uptake, then 0.5 mL of dexamethasone lidocaine solution  injected.  The left L5 SAP transverse junction targeted.  Bone contact  made, confirmed with lateral imaging.  Omnipaque 180 x0.5 mL  demonstrated intravascular uptake, then 0.5 mL of the dexamethasone  lidocaine solution was  injected.  The left L4 SAP transverse junction  targeted.  Bone contact made with lateral imaging.  Omnipaque 180 x0.5  mL demonstrated no intravascular uptake, then 0.5 mL of the  dexamethasone lidocaine solution injected.  The same procedure was  repeated on the right side using the same technique equipment and  injectate.  The dilution was 1 mL of 4 mg/mL dexamethasone, 2 mL of 2%  lidocaine.   Pre and post injection vitals stable.  Post injection pain level 0/10.  We will see her back in two weeks, regular follow-up visit.  Will assess  and discuss possible radiofrequency and neurotomy at that time.      Erick Colace, M.D.  Electronically Signed     AEK/MEDQ  D:  03/18/2007 09:44:10  T:  03/19/2007 08:50:46  Job:  [id number]

## 2010-10-08 NOTE — Assessment & Plan Note (Signed)
Ms. Deal follows up today.  She had an EMG on her left lower  extremity last visit which is basically normal.  No signs of diabetic  neuropathy, no signs of peroneal neuropathy, no signs of radiculopathy.  In the interval time period, she has had no new problems.  She has been  doing relatively well.  She could walk 10 minutes at a time.  She climbs  steps.  She drives.  Her average pain is 8 to 9/10, currently 7 to 8,  interferes with activity at a 6 to 7/10 level.  Sleep is poor.  Pain is  worse with walking, bending, standing.  Her pain interferes with her  meal prep, household duties and shopping.   REVIEW OF SYSTEMS:  Positive for constipation, has had some good results  with Senokot S but this was reportedly taken off the market.  Numbness,  tingling, trouble walking. Anxiety but negative for depression or  suicidal thoughts.   She had some sleep problems as noted above.   PAST MEDICAL HISTORY:  High blood pressure, diabetes.   SOCIAL HISTORY:  Divorced, lives with her 38-year-old son, pursuing  disability.  Smokes a pack per day.   FAMILY HISTORY:  Diabetes, high blood pressure, disability.   PHYSICAL EXAMINATION:  VITAL SIGNS:  Blood pressure 108/77, pulse 79,  respiratory rate 18, oxygen saturation 100% room air.  GENERAL:  In no acute distress.  Mood and affect appropriate.  BACK:  Has no tenderness to palpation. Lumbar spine range of motion is  75% forward flexion and extension.  EXTREMITIES:  Lower extremity range of motion is normal.  She has normal  deep tendon reflexes bilateral lower extremities.  No lower extremity  swelling or edema.  Coordination is normal.  Gait is normal.   IMPRESSION:  Lumbar facet syndrome.  She has had lumbar radiofrequency  procedure which has produced some improvement in her back pain; however,  she has had some limitations due to pain.  She has had a stable level of  functioning on the hydrocodone which she takes 10/325 up to 5 per  day.   We will continue this medication, no change is needed at the current  time.  We will also continue Topamax 30 mg at bedtime, may consider  increasing next visit.      Erick Colace, M.D.  Electronically Signed     AEK/MedQ  D:  09/30/2007 14:01:39  T:  09/30/2007 14:33:57  Job #:  [id number]   cc:   Angus G. Renard Matter, MD  Fax: [phone]

## 2010-10-08 NOTE — Assessment & Plan Note (Signed)
A 40 year old female with back and lower extremity pain.  CT of lumbar  spine demonstrated L4-5, L5-S1 facet arthropathy.  No significant  improvements with medial branch blocks.  She is scheduled for a  hysterectomy.  She does know exactly when.   Her Oswestry disability index score 74%.   Mood and affect are appropriate.  In general, no acute distress.  Her  extremities without edema.  Coordination normal.  Deep tendon reflexes  normal.  Motor strength is normal in the upper and lower extremities.  Back has tenderness to palpation of the lumbosacral junction.  Lumbar  range of motion 50% forward flexion, extension,lateral rotation, and  bending.   IMPRESSION:  1. Chronic low back pain.  Left lower extremity lumbar facet      arthropathy not relieved by medial branch blocks.  2. Scheduled for upcoming surgery on chronic narcotic analgesics.   PLAN:  I explained to her that to improve her post procedure analgesia.  We will wean her down preoperatively 4 times a day of the hydrocodone  10/325 x1 week then 3 times a day until she has her surgery.  Postoperatively, her surgeon will assume pain management for the first  month and then I will resume after that first month barring no  complications.   I will plan to see her back in about 3 months.   This has been discussed with the patient.  She is agreement and I have  sent a copy to her surgeon.      Erick Colace, M.D.  Electronically Signed     AEK/MedQ  D:  08/08/2008 09:38:27  T:  08/08/2008 23:43:05  Job #:  [id number]   cc:   Tilda Burrow, M.D.  Fax: [phone]

## 2010-10-08 NOTE — Assessment & Plan Note (Signed)
Allison [name] returns today.  She has had no new problems since I last saw  her.  She has started physical therapy.  She has had overall improvement  in her lumbar spine pain.  Her current pain is in the 5-6 range compared  to 7-8 last time.  Interferes with activity at a 6/10 to 7/10 level,  which is stable.  The pain interferes with meal prep, shopping, and  household duties.   REVIEW OF SYSTEMS:  Positive for constipation, abdominal pain, numbness  and tingling mainly in the left lower extremity.   SOCIAL HISTORY:  She is pursuing disability.  Has had a recent hearing.  She is divorced and lives with 38-year-old son.  Smokes a pack a day.   Her blood pressure is 112/69, pulse 78, respirations 18, O2 saturation  98% on room air.  GENERAL:  In no acute distress.  Mood and affect appropriate.  BACK:  No tenderness to palpation.  Lumbar spine range of motion is 75%  forward flexion and extension.  EXTREMITIES:  Normal range of motion.  She has normal deep tendon  reflexes.  No lower extremity swelling.  No edema.  Pronation is normal.  Gait is normal.   IMPRESSION:  Lumbar facet syndrome status post lumbar radiofrequency  procedure.  Pain reduction has allowed increased activity and  participation with physical therapy.  I will see her back in another  month after she has gone through her therapy program and at that point I  think we will be able to reduce her hydrocodone to 10/325 q.i.d. x1  month and the next month go down to t.i.d.      Erick Colace, M.D.  Electronically Signed     AEK/MedQ  D:  10/26/2007 15:28:30  T:  10/26/2007 17:01:15  Job #:  [id number]

## 2010-10-08 NOTE — Procedures (Signed)
NAMESAVANNHA, Allison [name]              ACCOUNT NO.:  [id number]   MEDICAL RECORD NO.:  [id number]          PATIENT TYPE:  REC   LOCATION:  [facility]                          FACILITY:  [facility]   PHYSICIAN:  Erick Colace, M.D.DATE OF BIRTH:  1971-02-21   DATE OF PROCEDURE:  02/23/2007  DATE OF DISCHARGE:                               OPERATIVE REPORT   This is bilateral L5 dorsal ramus injection, bilateral L4 medial branch  block, bilateral L3 medial branch block under fluoroscopic guidance.   INDICATIONS:  Lumbar facet arthropathy with axial back.  Pain is only  partially responsive to narcotic analgesic medication and other  conservative care and interferes with activities of daily living and  mobility.   Informed consent was obtained after describing the risks and benefits of  the procedure to the patient.  These include bleeding, bruising,  infection, loss of bowel and bladder function, temporary or permanent  paralysis.  She elects to proceed and has given written consent.   The patient placed prone on fluoroscopy table.  Betadine prep, sterile  drape.  A 25-gauge 1-1/2-inch needle was used to anesthetize skin and  subcutaneous tissue with 1% lidocaine x3 mL.  Then, a 22-gauge 3-1/2-  inch spinal needle was inserted under fluoroscopic guidance, first  targeting the left S1 SAP sacral ala junction, bone contact made,  confirmed with lateral imaging.  Omnipaque 180 x 0.3 mL demonstrated no  intravascular uptake.  Then, 0.5 mL of 2% MPF lidocaine were injected.  Then, the left L5 SAP transverse process junction targeted, bone contact  made, confirmed with lateral imaging.  Omnipaque 180 x 0.3 mL  demonstrated no intravascular uptake, and 0.5 mL of 2% lidocaine  solution was injected.  Next, the left L4 SAP transverse process  junction targeted, bone contact made, confirmed with lateral imaging.  Omnipaque 180 x 0.5 mL demonstrated no intravascular uptake and 0.5 mL  of the 2%  lidocaine solution was injected.  This same procedure was  repeated on the right side at the same levels, same technique and same  equipment and injectate.  The patient tolerated the procedure well.  Pre-  injection pain level 8/10; post injection 06/10.  Specifically states  that her back pain is numb and what she is really feeling is more leg  pain.   We will see her back in three weeks, schedule for repeat medial branch  blocks.      Erick Colace, M.D.  Electronically Signed     AEK/MEDQ  D:  02/23/2007 09:22:51  T:  02/23/2007 11:24:47  Job:  [id number]

## 2010-10-08 NOTE — Assessment & Plan Note (Signed)
Allison [name] is a 40 year old female with low back and lower extremity  pain.  She has had medial branch blocks, which were not particularly  helpful for her pain.  She has developed some increasing lower extremity  pain on left side but this is mainly above the knee.   Since I last saw her, we got a CT of her lumbar spine, which  demonstrates some L4-5 and L5-S1 facet arthropathy.  No significant  diskogenic disease.  No foraminal stenosis.  No central stenosis.   INTERVAL MEDICAL HISTORY:  Positive for a grade 3 Pap smear.  The pain  is around 7/10 currently mainly low back and proximal left lower  extremity.  She climbs step, she drives.  She walks 10 minutes at a  time.  She needs some assistance with meal prep, household duties, and  shopping.   REVIEW OF SYSTEMS:  In addition to above, has some numbness and tingling  in the left lower extremity, but had normal EMG in the past.  She has  constipation, abdominal pain, limb swelling, and sleep problems.   SOCIAL HISTORY:  Divorced, lives with her 87-year-old.  Smokes a pack a  day still.   PHYSICAL EXAMINATION:  VITAL SIGNS:  Blood pressure 132/94, pulse 76,  respiratory rate 16, and O2 sat 99% on room air.  Orientation x3, affect  is bright and alert.  Gait is normal.  EXTREMITIES:  Without edema.  Coordination is normal.  Deep tendon  reflexes are normal.  Lower extremity strength is normal.  She is able  to toe walk and heel walk.   IMPRESSION:  Chronic low back pain, left extremity pain.  I believe this  is not radicular and the differential is lumbar facet syndrome versus  sacroiliac disorder.  She has had some conflicting results with  injections, do not think further injections are needed.  I do not think  epidurals will be helpful in this situation.   PLAN:  We will go ahead and discontinue her hydrocodone 5 times per day  of 10/325 as well as Topamax 25 nightly.  I will see her back in 2  months.      Erick Colace, M.D.  Electronically Signed     AEK/MedQ  D:  04/18/2008 10:06:57  T:  04/18/2008 23:02:32  Job #:  [id number]

## 2010-10-08 NOTE — Assessment & Plan Note (Signed)
Allison [name] follows up today. I last saw her 07/22/2007.   At her last visit, she underwent a left sacroiliac injection  fluoroscopic guidance. The results were difficult to interpret since she  fell about two days after the injection. She states her left leg went  out on her. She could not really tell me whether it is more from the  knee or if she stubbed her foot. She thinks it maybe more from the knee,  but did not really have an knee pain.  She has had back pain. She has  had left lower extremity numbness and tingling.  She does have a history  of diabetes, which has been difficult to manage at times. She denies any  right lower extremity symptomatology at this time.   Her pain is described as sharp, burning tingling, aching, stabbing.  Interferes with activity at a 7 to 8 out of 10 level. Pain is worse with  walking, bending, sitting, standing. Improves with pacing activities,  medications help to a fair degree. She can walk five minutes at a time.  She can climb steps. On a good days she drives.  She needs some  assistance with meals, household duties and shopping.   REVIEW OF SYSTEMS:  Significant for anxiety, but negative for  depression. Has limb swelling at the end of the day, but not currently.  Abdominal pain, constipation, some weight gain.   SOCIAL HISTORY:  Divorced. Lives with his 38-year-old son. Smokes a pack  a day.   PHYSICAL EXAMINATION:  VITALS: Her blood pressure is 113/70,  pulse 80,  respirations 18. O2 SAT is 100% on room air.  GENERAL: In no acute distress.  LOWER EXTREMITY: Has no dermatomal distribution sensory changes. No  glove stocking changes. She has normal peripheral pulses. No evidence of  peripheral edema. She has normal coordination.  Gait is normal. Strength  5/5 bilateral hip flexion, knee extension and dorsiflexion.   IMPRESSION:  Left lower extremity paresthesias, dysesthesias. She is a  diabetic, but does not have right sided complaints,  question whether she  may have the presence of neuropathy. MRI mainly showing facet  arthropathy, no significant disk herniations or foraminal or canal  stenosis.   I will see her back for an EMG and CV of her left lower extremity. Will  continue current medications,  Would add Topamax 25 q.h.s.  See if this  helps with the neuropathic component of pain. May aid in weight loss as  well.      Erick Colace, M.D.  Electronically Signed     AEK/MedQ  D:  08/17/2007 17:58:52  T:  08/18/2007 08:35:34  Job #:  [id number]   cc:   Angus G. Renard Matter, MD  Fax: [phone]
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
## 2010-10-08 NOTE — Assessment & Plan Note (Signed)
REASON FOR VISIT:  Increased left leg pain after medial branch block  last month as well as a headache that occurred for about 3 days  postinjection.   The patient had a bilateral L5 dorsal ramus injection and bilateral L4  and L3 medial branch blocks last month, lidocaine only given that she is  diabetic.  No postprocedure complications.  She has had no relief of her  pain following the injection.  She did have good relief about a year ago  from a medial branch block, but not this time.  Her pain score is in the  8-9/10 range, described as sharp, burning, stabbing, constant tingling  and aching; worse with any type of activity, also with any inactivity.  She has difficulty with dressing, bathing, meal prep, household, and  shopping duties.   She has problems with constipation, weakness, numbness, anxiety, and  blood sugar problems.   PAST MEDICAL HISTORY:  Brittle diabetic.  She has hypertension.   SOCIAL HISTORY:  She is applying for disability, states that she has  been turned down.  She was told that she had not been seen in a Pain  Clinic.  She did request some records sent over and we will check.   She is divorced, lives with a 10-year-old son, smokes a pack a day.   PHYSICAL EXAMINATION:  VITAL SIGNS:  Blood pressure 123/70, pulse 74,  respirations 18, and O2 sat 96% on room air.  GENERAL:  In no acute distress.  Orientation x3.  Affect is bright and  alert.  Gait is normal.  EXTREMITIES:  Without edema.   DTR is normal.  She has normal range of motion in bilateral lower  extremities.  Good strength in bilateral lower extremities.  Negative  straight leg raising.   IMPRESSION:  Chronic low back pain as well as left lower extremity pain,  not clearly radicular.  She really did not have a consistent response  with lumbar medial branch block, so there is certainly no reason to  proceed on with radiofrequency.  She has social stressors, planning to  apply for disability.  I  do not really detect any sign of any type of  complication from her injection.  I do not think any imaging studies are  necessary.   She has had physical therapy as recently as 3 months ago, has not really  kept up with her exercises.  I have reminded her on this.  For now, we  will increase her topiramate to 50 at bedtime and I will see her back in  a month.  We will continue her hydrocodone q.i.d. 10 mg.  I was able to  look through her previous imaging studies.  She did have an MRI of the  brain in 2005, no evidence of any type of aneurysm.  Her last lumbar  imaging study was a 4-view lumbar spine, shown mild spondylosis.      Erick Colace, M.D.  Electronically Signed     AEK/MedQ  D:  02/21/2008 09:56:16  T:  02/21/2008 23:51:05  Job #:  [id number]

## 2010-10-08 NOTE — Procedures (Signed)
NAME:  NYKOLE, MATOS NO.:  [id number]   MEDICAL RECORD NO.:  [id number]          PATIENT TYPE:  REC   LOCATION:  [facility]                          FACILITY:  [facility]   PHYSICIAN:  Erick Colace, M.D.DATE OF BIRTH:  11/14/1970   DATE OF PROCEDURE:  DATE OF DISCHARGE:                               OPERATIVE REPORT   [name] is a 40 year old  female with positive response x2 to a  lumbar medial branch blocks.  She has left greater than right axial back  pain.  Her pain is only partially relieved by narcotic analgesic  medications and rated at a 7/10, and interferes with meal prep,  household duties and shopping.   Informed consent was obtained after describing risks and benefits of the  procedure to the patient.  These include bleeding, bruising, infection,  loss of bowel or bladder function, temporary or permanent paralysis.  She elects proceed and has given written consent.   PROCEDURE:  Left L5 dorsal ramus injection and radiofrequency neurotomy,  left L4 medial branch block and radiofrequency neurotomy, left L3 medial  branch block and radiofrequency neurotomy under fluoroscopic guidance.   The patient placed prone on fluoroscopy table.  Betadine prep, sterile  drape, a 25-gauge inch and a half needle was used to incise the skin and  subcu tissue, 1% lidocaine x2 mL.  Then a 20 gauge 10 cm RF needle with  10 mm curved active tip was inserted under fluoroscopic guidance, first  targeting the left S1 SAP sacral ala junction.  Bone contact made,  confirmed with lateral imaging.  Sensory stim 50 Hz followed by motor  stim 2 Hz confirmed proper needle location, followed by injection of 1  mL of a solution containing 1 mL of 40 mg/mL Depo-Medrol and 2 mL of 1%  MPF lidocaine, followed by RF lesioning 70 degrees x 70 seconds.  Then  the left L5 SAP transverse process junction targeted, bone contact made,  confirmed with lateral imaging.  Sensory stim followed at  50 Hz followed  by motor stim at 2 Hz confirmed proper needle location followed by  injection of 1 mL of the Depo-Medrol lidocaine solution and RF lesioning  70 degrees x 70 seconds and then the left L4 SAP transverse process  junction targeted, bone contact made, confirmed with lateral imaging.  Sensory stim and 50 Hz, followed by motor stim at 2 Hz confirmed proper  needle location and 1 mL of the Depo-Medrol lidocaine solution was  injected, followed by RF lesioning 70 degrees x 70 seconds.  The patient  tolerated procedure well.  Pre and post injection vitals stable.      Erick Colace, M.D.  Electronically Signed     AEK/MEDQ  D:  05/03/2007 10:09:40  T:  05/03/2007 11:07:44  Job:  [id number]

## 2010-10-08 NOTE — Procedures (Signed)
NAME:  Allison [name], Allison [name] NO.:  [id number]   MEDICAL RECORD NO.:  [id number]          PATIENT TYPE:  REC   LOCATION:  [facility]                          FACILITY:  [facility]   PHYSICIAN:  Erick Colace, M.D.DATE OF BIRTH:  Nov 08, 1970   DATE OF PROCEDURE:  07/22/2007  DATE OF DISCHARGE:                               OPERATIVE REPORT   This is a left sacroiliac injection under fluoroscopic guidance.   INDICATIONS:  Left sacroiliac pain, previous response to sacroiliac  injection approximately 1 month ago.  She went a couple of days which  revealed she did not have to take any hydrocodone.  She is back to  taking it five times a day, only with partial relief.   Informed consent was obtained after describing the risks and benefits of  the procedure to the patient.  These include bleeding, bruising,  infection, temporary or permanent paralysis.  She elects to proceed and  has given written consent.  The patient placed prone on fluoroscopy  table with Betadine prep, sterile drape.  A 25-gauge inch and a half  needle was used to anesthetize the skin and subcutaneous tissue, 1%  lidocaine x2 mL.  Then a 25-gauge, 3-inch spinal needle was inserted  under fluoroscopic guidance, left SI joint, AP, lateral and oblique  imaging utilized.  Omnipaque 180 x0.5 mL demonstrated no intravascular  uptake.  Then 1.5 mL of 2% MPF lidocaine was injected.  The patient  tolerated the procedure well.  Pre and post injection vitals stable.  Post injection instructions given.  Repeat UDS today.  Monitor pre and  post injection pain.  Possible sacroiliac RF if similar relief to last  time.      Erick Colace, M.D.  Electronically Signed     AEK/MEDQ  D:  07/22/2007 10:20:19  T:  07/23/2007 08:16:50  Job:  [id number]

## 2010-10-08 NOTE — Assessment & Plan Note (Signed)
HISTORY:  [name] had radiofrequency neurotomy approximately 1 month  ago - L5, L4, L3 level on left side.  Despite having good result with  medial branch blocks she did not have a good result with the  radiofrequency neurotomy and has had persistent pain, left side greater  than the right side.   She has had no new medical complications in the interval time.  She  continues to have constipation and has had relief with Senokot as she  tried it over-the-counter, Senokot plus [name] and feels like she may  have had a rash related to that.   Her pain is rated at 9-10/10, sharp, burning, stabbing, tingling,  aching, involving low back and left posterior thigh down to the calf.  Patient is worse with walking, bending, sitting, standing.  She walked  10 minutes at a time.  She has some difficulty with certain household  activities, meal prep, shopping and sometimes with dressing.   REVIEW OF SYSTEMS:  Please see 14-point review.   SOCIAL HISTORY:  Divorced, lives with her son.  Accompanied by her  mother today.   PHYSICAL EXAMINATION:  GENERAL:  In no acute distress.  Good affect,  appropriate.  BACK:  Has tenderness in left PSIS area.  Pain with extension but not  with flexion.  EXTREMITIES:  Lower extremity strength was good.  Her gait is normal.  SKIN:  Showed some redness from itching but otherwise no rash noted  either on low back or buttock area.  She has minimal erythema over the  upper quadrant bilaterally of the abdomen.   IMPRESSION/PLAN:  1. Axial greater than left lower extremity back pain.  While she had      some good results with medial branch blocks and no significant      result with radiofrequency, question whether she may have some      sacroiliac involvement as these are partially innervated by L5      dorsal ramus and L4 medial branch.  Will do diagnostic injection      today.  2. Check UDS.  3. Refill hydrocodone 10/325, up to 5x per day.  4. I will see her  back in 1 month.      Allison [name], M.D.  Electronically Signed     AEK/MedQ  D:  05/31/2007 10:36:31  T:  05/31/2007 12:59:14  Job #:  [id number]

## 2010-10-08 NOTE — Procedures (Signed)
NAME:  Allison [name], Allison [name] NO.:  [id number]   MEDICAL RECORD NO.:  [id number]          PATIENT TYPE:  REC   LOCATION:  [facility]                          FACILITY:  [facility]   PHYSICIAN:  Erick Colace, M.D.DATE OF BIRTH:  01/25/1971   DATE OF PROCEDURE:  DATE OF DISCHARGE:                               OPERATIVE REPORT   PROCEDURE:  Left sacroiliac injection under fluoroscopic guidance.   INDICATIONS:  Left-sided buttock pain, hip pain and back pain. She had  partial relief with medial branch blocks but not with radiofrequency  neurotomy.   The pain is only partially responsive to medication management and other  conservative care.   Informed consent was obtained describing the risks and benefits of the  procedure to the patient and these include bleeding, bruising,  infection. She elects to proceed and has given written consent. Given  that she is diabetic and poorly controlled, we will not use  corticosteroid and only a minimal amount of Omnipaque 180 given the  metformin.   Informed consent was obtained after describing the risks and benefits of  the procedure to the patient and these include bleeding, bruising,  infection. She elects to proceed and gave written consent. The patient  placed prone on the fluoroscopy table with Betadine prep, sterile drape.  A 25-gauge inch and half needle was used anesthetize the skin and subcu  tissue with 1% lidocaine x2 mLs and a 25-gauge 3 inch spinal needle was  inserted in the left SI joint, AP and lateral imaging.  Omnipaque 180 x  0.3 mL demonstrated good joint outline, no intravascular uptake followed  by injection of 2% lidocaine x 0.75 mL.  The patient tolerated the  procedure well.  Pre and post injection vitals stable.  Pre and post  injection pain level are both 10 although she states the area was  numb.   Will see her back in 1 month to follow-up on results.      Erick Colace, M.D.  Electronically  Signed     AEK/MEDQ  D:  05/31/2007 10:33:16  T:  05/31/2007 11:07:54  Job:  [id number]

## 2010-10-08 NOTE — Assessment & Plan Note (Signed)
Ms. Parchment is a 40 year old female with back lower extremity pain.  CT  of the lumbar spine showed L4-5, L5-S1 facet arthropathy.  No  significant improvement after medial branch blocks.  She has some foot  pain as a new complaint.  No trauma.  She has had no problems with  numbness or tingling in the right foot.  No pain from the back into the  foot.   She also has been diagnosed with increased ocular pressure.  She is not  sure whether she has glaucoma or not.   Her average pain is 9-10/10.  Sleep is poor.  Pain interferes with  activity 8-9/10.  She can walk 30 minutes at a time.  She can climb  steps.  She can drive.  She needs assist with bathing, meal prep,  household duties, shopping.   REVIEW OF SYSTEMS:  Positive for bowel control problems, weakness,  constipation, numbness, tingling, and trouble walking.   PAST MEDICAL HISTORY:  Diabetes, hypertension, uterine carcinoma,  arthritis.   SOCIAL HISTORY:  Divorced, lives with her son, smokes a pack per day.  No alcohol or drug use.   Blood pressure 129/87, pulse 93, respirations 18, O2 sat 100% on room  air.  Back has no tenderness to palpation.  She has good forward  flexion, however, extension is painful, particularly to the left side.   Lower extremity showed no evidence of edema.  She has good pulses on the  right foot.  She has some tenderness over the fifth met head, no  crepitus.  She also has some tenderness over the plantar fascia of the  right foot which is exacerbated by ankle dorsiflexion passively.   Deep tendon reflexes are normal.  Sensation normal.   IMPRESSION:  1. Lumbar spondylosis.  2. Right foot pain appears to have some metatarsalgia fifth metatarsal      head.  No skin lesions, also has plantar fasciitis.  We will sent      for an x-ray just given that she has point tenderness over the      metatarsal head of the right foot.  3. In terms of her increased ocular pressure, she is on a very small     dose of Topamax 25 nightly, we would hold this until she gets      clarification from her ophthalmologist in clearance to resume this      medicine from the [facility]      Erick Colace, M.D.  Electronically Signed     AEK/MedQ  D:  01/23/2009 09:44:23  T:  01/23/2009 23:05:09  Job #:  [id number]

## 2010-10-08 NOTE — Assessment & Plan Note (Signed)
HISTORY:  The patient follows up today.  She has had decent relief for a  few days with the last sacroiliac injection under fluoroscopic guidance.  Actually states that this helped as much as any injection she has had  thus far.  She has had gradual recurrence of pain.  She continues on  hydrocodone 10/325 five times a day.  Her last visit with me was May 31, 2007.  She has had repeat urinary drug screen performed, showed small  amounts of hydrocodone.  She complains of numbness in the left leg  greater than right leg.  She has a history of diabetes with poor  control.  Her pain averages 8-9/10, currently 7.  Complains of a  tingling, aching, stabbing discomfort left lower extremity.  She can  walk 10 minutes at a time.  Needs some assistance with meals prep,  household duties and shopping.   REVIEW OF SYSTEMS:  Review of systems also positive for anxiety but  negative for depression.  She has some shortness of breath, sleep  problems, limb swelling in the lower extremities, abdominal pain,  constipation.  She has been taking Senokot.  She has had some weight  gain.   SOCIAL HISTORY:  Divorced, lives with her 38-year-old son.  Smokes a pack  a day.   PHYSICAL EXAMINATION:  VITAL SIGNS:  Blood pressure 149/98, pulse 79,  respirations 18, 98% on room air.  GENERAL:  No acute distress.  Mood and affect appropriate.  Gait is  normal.  NEUROLOGICAL:  She has decreased sensation right L4 as well as left L5  and S1.  She has stocking distribution decreased left foot but not on  the right.  Her back has some mild tenderness to palpation in left PSIS  area.  Positive FABER maneuver on that side.  She has good hip internal  and external rotation.  Normal knee range of motion as well as ankle and  foot range of motion and her feet show no evidence of skin breakdown and  no evidence of swelling.  Her spine range of motion is 75% forward  flexion and extension.  Upper and lower extremity range  of motion are  normal.  Tone is normal with normal deep tendon reflexes bilateral upper  and lower extremities.  Coordination is normal.   IMPRESSION:  Lumbar pain, combination facet syndrome with sacroiliac  arthropathy.   PLAN:  1. Repeat left sacroiliac injection.  2. Diabetic neuropathic pain.  She has had an EMG in the past showing      this.  It is asymptomatic.  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  I do not think her lumbar MRI results would explain her      lower extremity complaint.  3. Narcotic analgesic usage.  Last urine drug screen showed      hydrocodone although amount smaller than what would be expected.      Will recheck it next visit.      Erick Colace, M.D.  Electronically Signed     AEK/MedQ  D:  07/06/2007 13:16:44  T:  07/07/2007 13:29:30  Job #:  [id number]   cc:   Angus G. Renard Matter, MD  Fax: [phone]

## 2010-10-08 NOTE — Assessment & Plan Note (Signed)
A 40 year old female with juvenile onset diabetes at age 69 who has had  problems with low back and lower extremity pain including numbness and  tingling. MRI mainly showing facet arthropathy. No significant disc  herniations.   She has had no new medical problems since I last saw her. Review of her  urine tox screen demonstrates positive benzodiazepines, positive  hydrocodone and hydromorphone that is in metabolite and negative for  illicit drugs. Overall, this is consistent with the reported medication  usage.   Her average pain is 8-10/10, mainly in the low back and some in the mid  back and some in the proximal lower extremities, mainly in the posterior  thighs. She needs some help with meal prep, household duties and  shopping. She has some problems with depression and anxiety, but  negative suicidal thoughts. Positive for constipation which has been  improved with Senokot.   SOCIAL HISTORY:  Lives alone with 43-year-old son. Smokes a half pack a  day.   Blood pressure 118/84, pulse 82, respiratory rate is 16. O2 sat is 97%  on room air.  GENERAL: In no acute distress. Mood and affect is appropriate.  Gait is normal.  Her back has mild tenderness to palpation in the lumbosacral junction.  She has pain with extension of the lumbar spine, greater than with  flexion.  Her sensation is decreased left L4 and decreased right S1.  Deep tendon reflexes are normal.  Strength is normal.  Straight leg raising tests is negative.   IMPRESSION:  Lumbar facet arthropathy.   PLAN:  Given her negative urine drug screen, I will be able to take over  her hydrocodone. I do not think she would be good for longterm NSAIDs  given her diabetes and risk for renal disease. We did get a BMET which  was normal with the exception of elevated non-fasting blood sugar of  290.   Discussed treatment options including further diagnostic/therapeutic  injections. We can do lidocaine only blocks to the  lumbar medial branch  and if successful on two separate occasions we can do radiofrequency  neurotomy also without corticosteroids.   This was discussed with the patient and she would like to proceed and we  will schedule her for this in the next week or two.      Erick Colace, M.D.  Electronically Signed     AEK/MedQ  D:  02/02/2007 12:48:44  T:  02/02/2007 22:11:55  Job #:  [id number]   cc:   Angus G. Renard Matter, MD  Fax: [phone]   Sharolyn Douglas, M.D.  Fax: [phone]

## 2011-01-13 ENCOUNTER — Encounter (INDEPENDENT_AMBULATORY_CARE_PROVIDER_SITE_OTHER): Payer: Medicaid Other | Admitting: Ophthalmology

## 2011-01-13 DIAGNOSIS — E11319 Type 2 diabetes mellitus with unspecified diabetic retinopathy without macular edema: Secondary | ICD-10-CM

## 2011-01-13 DIAGNOSIS — H43819 Vitreous degeneration, unspecified eye: Secondary | ICD-10-CM

## 2011-01-13 DIAGNOSIS — E11359 Type 2 diabetes mellitus with proliferative diabetic retinopathy without macular edema: Secondary | ICD-10-CM

## 2011-01-13 DIAGNOSIS — H35059 Retinal neovascularization, unspecified, unspecified eye: Secondary | ICD-10-CM

## 2011-01-24 ENCOUNTER — Encounter (INDEPENDENT_AMBULATORY_CARE_PROVIDER_SITE_OTHER): Payer: Medicaid Other | Admitting: Ophthalmology

## 2011-01-24 DIAGNOSIS — E11359 Type 2 diabetes mellitus with proliferative diabetic retinopathy without macular edema: Secondary | ICD-10-CM

## 2011-03-16 LAB — HEMOGLOBIN A1C: HEMOGLOBIN A1C: 14.7

## 2011-05-26 ENCOUNTER — Ambulatory Visit (INDEPENDENT_AMBULATORY_CARE_PROVIDER_SITE_OTHER): Payer: Medicaid Other | Admitting: Ophthalmology

## 2011-06-02 ENCOUNTER — Ambulatory Visit (INDEPENDENT_AMBULATORY_CARE_PROVIDER_SITE_OTHER): Payer: Medicaid Other | Admitting: Ophthalmology

## 2011-06-02 DIAGNOSIS — E11311 Type 2 diabetes mellitus with unspecified diabetic retinopathy with macular edema: Secondary | ICD-10-CM

## 2011-06-02 DIAGNOSIS — H2510 Age-related nuclear cataract, unspecified eye: Secondary | ICD-10-CM

## 2011-06-02 DIAGNOSIS — H43819 Vitreous degeneration, unspecified eye: Secondary | ICD-10-CM

## 2011-06-02 DIAGNOSIS — E1039 Type 1 diabetes mellitus with other diabetic ophthalmic complication: Secondary | ICD-10-CM

## 2011-06-02 DIAGNOSIS — E11359 Type 2 diabetes mellitus with proliferative diabetic retinopathy without macular edema: Secondary | ICD-10-CM

## 2011-06-24 ENCOUNTER — Encounter (INDEPENDENT_AMBULATORY_CARE_PROVIDER_SITE_OTHER): Payer: Medicaid Other | Admitting: Ophthalmology

## 2011-06-24 DIAGNOSIS — E1139 Type 2 diabetes mellitus with other diabetic ophthalmic complication: Secondary | ICD-10-CM

## 2011-06-24 DIAGNOSIS — E11311 Type 2 diabetes mellitus with unspecified diabetic retinopathy with macular edema: Secondary | ICD-10-CM

## 2011-06-30 ENCOUNTER — Encounter (INDEPENDENT_AMBULATORY_CARE_PROVIDER_SITE_OTHER): Payer: Medicaid Other | Admitting: Ophthalmology

## 2011-06-30 DIAGNOSIS — H2510 Age-related nuclear cataract, unspecified eye: Secondary | ICD-10-CM

## 2011-06-30 DIAGNOSIS — E1039 Type 1 diabetes mellitus with other diabetic ophthalmic complication: Secondary | ICD-10-CM

## 2011-06-30 DIAGNOSIS — H43819 Vitreous degeneration, unspecified eye: Secondary | ICD-10-CM

## 2011-06-30 DIAGNOSIS — E11311 Type 2 diabetes mellitus with unspecified diabetic retinopathy with macular edema: Secondary | ICD-10-CM

## 2011-06-30 DIAGNOSIS — E11359 Type 2 diabetes mellitus with proliferative diabetic retinopathy without macular edema: Secondary | ICD-10-CM

## 2011-07-21 ENCOUNTER — Encounter (INDEPENDENT_AMBULATORY_CARE_PROVIDER_SITE_OTHER): Payer: Medicaid Other | Admitting: Ophthalmology

## 2011-07-21 DIAGNOSIS — E1039 Type 1 diabetes mellitus with other diabetic ophthalmic complication: Secondary | ICD-10-CM

## 2011-07-21 DIAGNOSIS — E11319 Type 2 diabetes mellitus with unspecified diabetic retinopathy without macular edema: Secondary | ICD-10-CM

## 2011-07-21 DIAGNOSIS — H43819 Vitreous degeneration, unspecified eye: Secondary | ICD-10-CM

## 2011-07-21 DIAGNOSIS — E11311 Type 2 diabetes mellitus with unspecified diabetic retinopathy with macular edema: Secondary | ICD-10-CM

## 2011-07-28 ENCOUNTER — Encounter (INDEPENDENT_AMBULATORY_CARE_PROVIDER_SITE_OTHER): Payer: Medicaid Other | Admitting: Ophthalmology

## 2011-07-28 DIAGNOSIS — H43819 Vitreous degeneration, unspecified eye: Secondary | ICD-10-CM

## 2011-07-28 DIAGNOSIS — H2510 Age-related nuclear cataract, unspecified eye: Secondary | ICD-10-CM

## 2011-07-28 DIAGNOSIS — E11359 Type 2 diabetes mellitus with proliferative diabetic retinopathy without macular edema: Secondary | ICD-10-CM

## 2011-07-28 DIAGNOSIS — E11311 Type 2 diabetes mellitus with unspecified diabetic retinopathy with macular edema: Secondary | ICD-10-CM

## 2011-07-28 DIAGNOSIS — E1039 Type 1 diabetes mellitus with other diabetic ophthalmic complication: Secondary | ICD-10-CM

## 2011-09-10 ENCOUNTER — Encounter (INDEPENDENT_AMBULATORY_CARE_PROVIDER_SITE_OTHER): Payer: Medicaid Other | Admitting: Ophthalmology

## 2011-09-10 DIAGNOSIS — H43819 Vitreous degeneration, unspecified eye: Secondary | ICD-10-CM

## 2011-09-10 DIAGNOSIS — E11311 Type 2 diabetes mellitus with unspecified diabetic retinopathy with macular edema: Secondary | ICD-10-CM

## 2011-09-10 DIAGNOSIS — E11359 Type 2 diabetes mellitus with proliferative diabetic retinopathy without macular edema: Secondary | ICD-10-CM

## 2011-09-10 DIAGNOSIS — E1065 Type 1 diabetes mellitus with hyperglycemia: Secondary | ICD-10-CM

## 2011-10-29 ENCOUNTER — Encounter (INDEPENDENT_AMBULATORY_CARE_PROVIDER_SITE_OTHER): Payer: Medicaid Other | Admitting: Ophthalmology

## 2011-10-29 DIAGNOSIS — H26499 Other secondary cataract, unspecified eye: Secondary | ICD-10-CM

## 2011-10-29 DIAGNOSIS — E11359 Type 2 diabetes mellitus with proliferative diabetic retinopathy without macular edema: Secondary | ICD-10-CM

## 2011-10-29 DIAGNOSIS — E1139 Type 2 diabetes mellitus with other diabetic ophthalmic complication: Secondary | ICD-10-CM

## 2011-10-29 DIAGNOSIS — H2700 Aphakia, unspecified eye: Secondary | ICD-10-CM

## 2011-10-29 DIAGNOSIS — H43819 Vitreous degeneration, unspecified eye: Secondary | ICD-10-CM

## 2011-11-12 ENCOUNTER — Ambulatory Visit (INDEPENDENT_AMBULATORY_CARE_PROVIDER_SITE_OTHER): Payer: Medicaid Other | Admitting: Ophthalmology

## 2011-11-12 DIAGNOSIS — H2700 Aphakia, unspecified eye: Secondary | ICD-10-CM

## 2011-12-31 ENCOUNTER — Encounter (INDEPENDENT_AMBULATORY_CARE_PROVIDER_SITE_OTHER): Payer: Medicaid Other | Admitting: Ophthalmology

## 2011-12-31 DIAGNOSIS — H3581 Retinal edema: Secondary | ICD-10-CM

## 2011-12-31 DIAGNOSIS — H2510 Age-related nuclear cataract, unspecified eye: Secondary | ICD-10-CM

## 2011-12-31 DIAGNOSIS — E11319 Type 2 diabetes mellitus with unspecified diabetic retinopathy without macular edema: Secondary | ICD-10-CM

## 2011-12-31 DIAGNOSIS — E1039 Type 1 diabetes mellitus with other diabetic ophthalmic complication: Secondary | ICD-10-CM

## 2011-12-31 DIAGNOSIS — H43819 Vitreous degeneration, unspecified eye: Secondary | ICD-10-CM

## 2012-02-11 ENCOUNTER — Encounter (INDEPENDENT_AMBULATORY_CARE_PROVIDER_SITE_OTHER): Payer: Medicaid Other | Admitting: Ophthalmology

## 2012-02-11 DIAGNOSIS — H3581 Retinal edema: Secondary | ICD-10-CM

## 2012-02-11 DIAGNOSIS — E1065 Type 1 diabetes mellitus with hyperglycemia: Secondary | ICD-10-CM

## 2012-02-11 DIAGNOSIS — H43819 Vitreous degeneration, unspecified eye: Secondary | ICD-10-CM

## 2012-02-11 DIAGNOSIS — E11359 Type 2 diabetes mellitus with proliferative diabetic retinopathy without macular edema: Secondary | ICD-10-CM

## 2012-02-11 DIAGNOSIS — I10 Essential (primary) hypertension: Secondary | ICD-10-CM

## 2012-02-11 DIAGNOSIS — H2510 Age-related nuclear cataract, unspecified eye: Secondary | ICD-10-CM

## 2012-02-11 DIAGNOSIS — H35039 Hypertensive retinopathy, unspecified eye: Secondary | ICD-10-CM

## 2012-02-18 ENCOUNTER — Encounter (INDEPENDENT_AMBULATORY_CARE_PROVIDER_SITE_OTHER): Payer: Medicaid Other | Admitting: Ophthalmology

## 2012-02-25 ENCOUNTER — Encounter (INDEPENDENT_AMBULATORY_CARE_PROVIDER_SITE_OTHER): Payer: Medicaid Other | Admitting: Ophthalmology

## 2012-02-25 DIAGNOSIS — H3581 Retinal edema: Secondary | ICD-10-CM

## 2012-02-25 DIAGNOSIS — E1039 Type 1 diabetes mellitus with other diabetic ophthalmic complication: Secondary | ICD-10-CM

## 2012-02-25 DIAGNOSIS — I10 Essential (primary) hypertension: Secondary | ICD-10-CM

## 2012-02-25 DIAGNOSIS — E11359 Type 2 diabetes mellitus with proliferative diabetic retinopathy without macular edema: Secondary | ICD-10-CM

## 2012-02-25 DIAGNOSIS — H43819 Vitreous degeneration, unspecified eye: Secondary | ICD-10-CM

## 2012-02-25 DIAGNOSIS — H35039 Hypertensive retinopathy, unspecified eye: Secondary | ICD-10-CM

## 2012-02-25 DIAGNOSIS — H2510 Age-related nuclear cataract, unspecified eye: Secondary | ICD-10-CM

## 2012-03-19 ENCOUNTER — Encounter (INDEPENDENT_AMBULATORY_CARE_PROVIDER_SITE_OTHER): Payer: Medicaid Other | Admitting: Ophthalmology

## 2012-03-19 DIAGNOSIS — H3581 Retinal edema: Secondary | ICD-10-CM

## 2012-03-19 DIAGNOSIS — I10 Essential (primary) hypertension: Secondary | ICD-10-CM

## 2012-03-19 DIAGNOSIS — E11359 Type 2 diabetes mellitus with proliferative diabetic retinopathy without macular edema: Secondary | ICD-10-CM

## 2012-03-19 DIAGNOSIS — E1139 Type 2 diabetes mellitus with other diabetic ophthalmic complication: Secondary | ICD-10-CM

## 2012-03-19 DIAGNOSIS — H35039 Hypertensive retinopathy, unspecified eye: Secondary | ICD-10-CM

## 2012-04-15 ENCOUNTER — Encounter (INDEPENDENT_AMBULATORY_CARE_PROVIDER_SITE_OTHER): Payer: Medicaid Other | Admitting: Ophthalmology

## 2012-04-19 ENCOUNTER — Encounter (INDEPENDENT_AMBULATORY_CARE_PROVIDER_SITE_OTHER): Payer: Medicaid Other | Admitting: Ophthalmology

## 2012-04-30 ENCOUNTER — Encounter (INDEPENDENT_AMBULATORY_CARE_PROVIDER_SITE_OTHER): Payer: Medicaid Other | Admitting: Ophthalmology

## 2012-04-30 DIAGNOSIS — H35039 Hypertensive retinopathy, unspecified eye: Secondary | ICD-10-CM

## 2012-04-30 DIAGNOSIS — H43819 Vitreous degeneration, unspecified eye: Secondary | ICD-10-CM

## 2012-04-30 DIAGNOSIS — H2510 Age-related nuclear cataract, unspecified eye: Secondary | ICD-10-CM

## 2012-04-30 DIAGNOSIS — E1139 Type 2 diabetes mellitus with other diabetic ophthalmic complication: Secondary | ICD-10-CM

## 2012-04-30 DIAGNOSIS — E11359 Type 2 diabetes mellitus with proliferative diabetic retinopathy without macular edema: Secondary | ICD-10-CM

## 2012-04-30 DIAGNOSIS — I10 Essential (primary) hypertension: Secondary | ICD-10-CM

## 2012-07-01 ENCOUNTER — Encounter (INDEPENDENT_AMBULATORY_CARE_PROVIDER_SITE_OTHER): Payer: Medicaid Other | Admitting: Ophthalmology

## 2012-07-01 DIAGNOSIS — E1139 Type 2 diabetes mellitus with other diabetic ophthalmic complication: Secondary | ICD-10-CM

## 2012-07-01 DIAGNOSIS — H43819 Vitreous degeneration, unspecified eye: Secondary | ICD-10-CM

## 2012-07-01 DIAGNOSIS — E11359 Type 2 diabetes mellitus with proliferative diabetic retinopathy without macular edema: Secondary | ICD-10-CM

## 2012-07-01 DIAGNOSIS — I10 Essential (primary) hypertension: Secondary | ICD-10-CM

## 2012-07-01 DIAGNOSIS — H2510 Age-related nuclear cataract, unspecified eye: Secondary | ICD-10-CM

## 2012-07-01 DIAGNOSIS — H2700 Aphakia, unspecified eye: Secondary | ICD-10-CM

## 2012-07-01 DIAGNOSIS — H35039 Hypertensive retinopathy, unspecified eye: Secondary | ICD-10-CM

## 2012-08-30 ENCOUNTER — Encounter (INDEPENDENT_AMBULATORY_CARE_PROVIDER_SITE_OTHER): Payer: Medicaid Other | Admitting: Ophthalmology

## 2012-09-14 ENCOUNTER — Encounter (INDEPENDENT_AMBULATORY_CARE_PROVIDER_SITE_OTHER): Payer: Medicaid Other | Admitting: Ophthalmology

## 2012-09-14 DIAGNOSIS — H43819 Vitreous degeneration, unspecified eye: Secondary | ICD-10-CM

## 2012-09-14 DIAGNOSIS — E1039 Type 1 diabetes mellitus with other diabetic ophthalmic complication: Secondary | ICD-10-CM

## 2012-09-14 DIAGNOSIS — I10 Essential (primary) hypertension: Secondary | ICD-10-CM

## 2012-09-14 DIAGNOSIS — H35039 Hypertensive retinopathy, unspecified eye: Secondary | ICD-10-CM

## 2012-09-14 DIAGNOSIS — E11311 Type 2 diabetes mellitus with unspecified diabetic retinopathy with macular edema: Secondary | ICD-10-CM

## 2012-09-14 DIAGNOSIS — E11359 Type 2 diabetes mellitus with proliferative diabetic retinopathy without macular edema: Secondary | ICD-10-CM

## 2012-09-15 ENCOUNTER — Encounter (INDEPENDENT_AMBULATORY_CARE_PROVIDER_SITE_OTHER): Payer: Self-pay | Admitting: Ophthalmology

## 2012-10-01 ENCOUNTER — Telehealth (HOSPITAL_COMMUNITY): Payer: Self-pay | Admitting: Dietician

## 2012-10-01 NOTE — Telephone Encounter (Signed)
Received voicemail from pt left on 09/29/12 at 1842. Called back today at 1019 and left message on voicemail.

## 2012-10-05 ENCOUNTER — Encounter: Payer: Self-pay | Admitting: *Deleted

## 2012-10-05 NOTE — Telephone Encounter (Signed)
Did not receive return call.

## 2012-10-06 ENCOUNTER — Encounter: Payer: Self-pay | Admitting: *Deleted

## 2012-10-08 ENCOUNTER — Ambulatory Visit (INDEPENDENT_AMBULATORY_CARE_PROVIDER_SITE_OTHER): Payer: Medicaid Other | Admitting: Internal Medicine

## 2012-10-08 ENCOUNTER — Encounter: Payer: Self-pay | Admitting: Internal Medicine

## 2012-10-08 VITALS — BP 102/60 | HR 77 | Ht 62.5 in | Wt 172.5 lb

## 2012-10-08 DIAGNOSIS — E1142 Type 2 diabetes mellitus with diabetic polyneuropathy: Secondary | ICD-10-CM

## 2012-10-08 DIAGNOSIS — Z794 Long term (current) use of insulin: Secondary | ICD-10-CM

## 2012-10-08 DIAGNOSIS — E119 Type 2 diabetes mellitus without complications: Secondary | ICD-10-CM

## 2012-10-08 DIAGNOSIS — E785 Hyperlipidemia, unspecified: Secondary | ICD-10-CM

## 2012-10-08 DIAGNOSIS — E11311 Type 2 diabetes mellitus with unspecified diabetic retinopathy with macular edema: Secondary | ICD-10-CM | POA: Insufficient documentation

## 2012-10-08 DIAGNOSIS — E1140 Type 2 diabetes mellitus with diabetic neuropathy, unspecified: Secondary | ICD-10-CM

## 2012-10-08 DIAGNOSIS — E1149 Type 2 diabetes mellitus with other diabetic neurological complication: Secondary | ICD-10-CM

## 2012-10-08 DIAGNOSIS — E782 Mixed hyperlipidemia: Secondary | ICD-10-CM | POA: Insufficient documentation

## 2012-10-08 DIAGNOSIS — R002 Palpitations: Secondary | ICD-10-CM

## 2012-10-08 DIAGNOSIS — I10 Essential (primary) hypertension: Secondary | ICD-10-CM | POA: Insufficient documentation

## 2012-10-08 DIAGNOSIS — R0789 Other chest pain: Secondary | ICD-10-CM

## 2012-10-08 NOTE — Patient Instructions (Signed)
Your physician recommends that you schedule a follow-up appointment in as needed. . If you have anymore palpitation and it becomes worse give our office a call. Your physician recommends that you continue on your current medications as directed. Please refer to the Current Medication list given to you today.

## 2012-10-08 NOTE — Progress Notes (Signed)
[facility]          OFFICE NOTE   Chief Complaint:  Chest fluttering  Primary Care Physician: Alice Reichert, MD  HPI:  Allison [name] is a 42 year old female referred for evaluation of left-sided chest pain. As you are aware, she has been in insulin-dependent diabetic for over 20 years with very poor control. Recent hemoglobin A1c was noted to be greater than 14. She also has end-organ damage including retinopathy with numerous laser treatments and injections, as well as peripheral neuropathy and also difficulty with constipation and bowel movements indicating a possible gastroparesis. In addition, she does have some hypertension, as well as an indication for an ARB and is currently on Diovan, as well as dyslipidemia on Crestor. Finally, she has significant anxiety and is currently on medication for that, as well. She has been having some episodes over the past several months to a year, which have been increasing in frequency including chest pressure on her left axillary area that radiates to the shoulder blades. It is not necessarily associated with eating nor is it associated with exertion or relieved by rest.  She underwent a lexiscan NST in 03/2011 which was negative for ischemia and EF was 70%.  She returns today for 2 episodes of "fluttering" in her chest over the past 3 weeks. The first episode was significant and awoke her from sleep. The second was less severe. Both lasted less than 10 seconds. She thought she would "pass out" during the episodes. She did not have pain during the episodes. There has also been a significant increase in the stress and anxiety in her life recently.  PMHx:  Past Medical History  Diagnosis Date  . Insulin dependent diabetes mellitus   . Hypertension   . Gastroparesis     possible  . Neuropathy   . Retinopathy   . Dyslipidemia     History reviewed. No pertinent past surgical history.  FAMHx:  Family History  Problem  Relation Age of Onset  . Cancer Other     family h/o  . Coronary artery disease Other     family h/o    SOCHx:   reports that she has been smoking Cigarettes.  She has been smoking about 0.25 packs per day. She does not have any smokeless tobacco history on file. She reports that she does not drink alcohol. Her drug history is not on file.  ALLERGIES:  Allergies  Allergen Reactions  . Codeine     TYLENOL #3  . Doxycycline   . Oxycodone Itching    ROS: A comprehensive review of systems was negative except for: Cardiovascular: positive for palpitations  HOME MEDS: Current Outpatient Prescriptions  Medication Sig Dispense Refill  . ALPRAZolam (XANAX) 1 MG tablet Take 1 mg by mouth 3 (three) times daily as needed for sleep.       [name] aspirin EC 81 MG tablet Take 81 mg by mouth daily.      [name] desvenlafaxine (PRISTIQ) 50 MG 24 hr tablet Take 50 mg by mouth daily.      . furosemide (LASIX) 20 MG tablet Take 20 mg by mouth as directed.      . insulin aspart (NOVOLOG) 100 UNIT/ML injection Inject into the skin 3 (three) times daily with meals. Sliding scale      . insulin glargine (LANTUS) 100 UNIT/ML injection Inject into the skin as directed.      [name] omeprazole (PRILOSEC) 20 MG capsule Take 20 mg by  mouth daily.      . potassium chloride SA (K-DUR,KLOR-CON) 20 MEQ tablet Take 20 mEq by mouth as directed.       No current facility-administered medications for this visit.    LABS/IMAGING: No results found for this or any previous visit (from the past 48 hour(s)). No results found.  VITALS: BP 102/60  Pulse 77  Ht 5' 2.5" (1.588 m)  Wt 172 lb 8 oz (78.245 kg)  BMI 31.03 kg/m2  EXAM: General appearance: alert and no distress Neck: no adenopathy, no carotid bruit, no JVD, supple, symmetrical, trachea midline and thyroid not enlarged, symmetric, no tenderness/mass/nodules Lungs: clear to auscultation bilaterally Heart: regular rate and rhythm, S1, S2 normal, no murmur, click, rub  or gallop Abdomen: soft, non-tender; bowel sounds normal; no masses,  no organomegaly Extremities: extremities normal, atraumatic, no cyanosis or edema Pulses: 2+ and symmetric Skin: Skin color, texture, turgor normal. No rashes or lesions Neurologic: Grossly normal  EKG: NSR at 77  ASSESSMENT: 1. Tachypalpitations 2. Anxiety 3. IDDM 4. HTN 5. HPL  PLAN: 1.  Ms. Forgy is describing tachy palpitations. She reports that she had two episodes which spontaneously resolved. I have offered her event monitoring or b-blocker, but she declined both. She said that she had an "allergy" to timolol eye drops recently which caused her to become flushed and hot and almost pass out.  She will contact the office if she desires monitoring, but I reassured her with a recent negative stress test and normal EF, these episodes are likely benign.  Follow-up in our office as needed.  Chrystie Nose, MD, [facility] Attending Cardiologist [facility]  Myalynn Lingle C 10/08/2012, 5:45 PM

## 2012-10-27 ENCOUNTER — Encounter (INDEPENDENT_AMBULATORY_CARE_PROVIDER_SITE_OTHER): Payer: Medicaid Other | Admitting: Ophthalmology

## 2012-10-27 DIAGNOSIS — H2510 Age-related nuclear cataract, unspecified eye: Secondary | ICD-10-CM

## 2012-10-27 DIAGNOSIS — H35039 Hypertensive retinopathy, unspecified eye: Secondary | ICD-10-CM

## 2012-10-27 DIAGNOSIS — E1165 Type 2 diabetes mellitus with hyperglycemia: Secondary | ICD-10-CM

## 2012-10-27 DIAGNOSIS — I10 Essential (primary) hypertension: Secondary | ICD-10-CM

## 2012-10-27 DIAGNOSIS — E11311 Type 2 diabetes mellitus with unspecified diabetic retinopathy with macular edema: Secondary | ICD-10-CM

## 2012-10-27 DIAGNOSIS — E11359 Type 2 diabetes mellitus with proliferative diabetic retinopathy without macular edema: Secondary | ICD-10-CM

## 2012-10-27 DIAGNOSIS — H43819 Vitreous degeneration, unspecified eye: Secondary | ICD-10-CM

## 2012-11-24 ENCOUNTER — Encounter (INDEPENDENT_AMBULATORY_CARE_PROVIDER_SITE_OTHER): Payer: Medicaid Other | Admitting: Ophthalmology

## 2012-11-24 DIAGNOSIS — H2510 Age-related nuclear cataract, unspecified eye: Secondary | ICD-10-CM

## 2012-11-24 DIAGNOSIS — I10 Essential (primary) hypertension: Secondary | ICD-10-CM

## 2012-11-24 DIAGNOSIS — E1039 Type 1 diabetes mellitus with other diabetic ophthalmic complication: Secondary | ICD-10-CM

## 2012-11-24 DIAGNOSIS — E11359 Type 2 diabetes mellitus with proliferative diabetic retinopathy without macular edema: Secondary | ICD-10-CM

## 2012-11-24 DIAGNOSIS — H43819 Vitreous degeneration, unspecified eye: Secondary | ICD-10-CM

## 2012-11-24 DIAGNOSIS — E11311 Type 2 diabetes mellitus with unspecified diabetic retinopathy with macular edema: Secondary | ICD-10-CM

## 2012-11-24 DIAGNOSIS — H35039 Hypertensive retinopathy, unspecified eye: Secondary | ICD-10-CM

## 2012-11-25 ENCOUNTER — Encounter (INDEPENDENT_AMBULATORY_CARE_PROVIDER_SITE_OTHER): Payer: Medicaid Other | Admitting: Ophthalmology

## 2012-12-08 ENCOUNTER — Encounter (INDEPENDENT_AMBULATORY_CARE_PROVIDER_SITE_OTHER): Payer: Medicaid Other | Admitting: Ophthalmology

## 2013-01-07 ENCOUNTER — Encounter (INDEPENDENT_AMBULATORY_CARE_PROVIDER_SITE_OTHER): Payer: Medicaid Other | Admitting: Ophthalmology

## 2013-01-07 DIAGNOSIS — I10 Essential (primary) hypertension: Secondary | ICD-10-CM

## 2013-01-07 DIAGNOSIS — H35039 Hypertensive retinopathy, unspecified eye: Secondary | ICD-10-CM

## 2013-01-07 DIAGNOSIS — H43819 Vitreous degeneration, unspecified eye: Secondary | ICD-10-CM

## 2013-01-07 DIAGNOSIS — E11311 Type 2 diabetes mellitus with unspecified diabetic retinopathy with macular edema: Secondary | ICD-10-CM

## 2013-01-07 DIAGNOSIS — E1039 Type 1 diabetes mellitus with other diabetic ophthalmic complication: Secondary | ICD-10-CM

## 2013-01-07 DIAGNOSIS — E11359 Type 2 diabetes mellitus with proliferative diabetic retinopathy without macular edema: Secondary | ICD-10-CM

## 2013-01-07 DIAGNOSIS — H2510 Age-related nuclear cataract, unspecified eye: Secondary | ICD-10-CM

## 2013-02-23 ENCOUNTER — Encounter (INDEPENDENT_AMBULATORY_CARE_PROVIDER_SITE_OTHER): Payer: Medicaid Other | Admitting: Ophthalmology

## 2013-02-23 DIAGNOSIS — H35039 Hypertensive retinopathy, unspecified eye: Secondary | ICD-10-CM

## 2013-02-23 DIAGNOSIS — E1139 Type 2 diabetes mellitus with other diabetic ophthalmic complication: Secondary | ICD-10-CM

## 2013-02-23 DIAGNOSIS — E11311 Type 2 diabetes mellitus with unspecified diabetic retinopathy with macular edema: Secondary | ICD-10-CM

## 2013-02-23 DIAGNOSIS — H43819 Vitreous degeneration, unspecified eye: Secondary | ICD-10-CM

## 2013-02-23 DIAGNOSIS — E11359 Type 2 diabetes mellitus with proliferative diabetic retinopathy without macular edema: Secondary | ICD-10-CM

## 2013-02-23 DIAGNOSIS — H2510 Age-related nuclear cataract, unspecified eye: Secondary | ICD-10-CM

## 2013-02-23 DIAGNOSIS — I10 Essential (primary) hypertension: Secondary | ICD-10-CM

## 2013-04-06 ENCOUNTER — Encounter (INDEPENDENT_AMBULATORY_CARE_PROVIDER_SITE_OTHER): Payer: Medicaid Other | Admitting: Ophthalmology

## 2013-04-06 DIAGNOSIS — E11359 Type 2 diabetes mellitus with proliferative diabetic retinopathy without macular edema: Secondary | ICD-10-CM

## 2013-04-06 DIAGNOSIS — H35039 Hypertensive retinopathy, unspecified eye: Secondary | ICD-10-CM

## 2013-04-06 DIAGNOSIS — H43819 Vitreous degeneration, unspecified eye: Secondary | ICD-10-CM

## 2013-04-06 DIAGNOSIS — E11311 Type 2 diabetes mellitus with unspecified diabetic retinopathy with macular edema: Secondary | ICD-10-CM

## 2013-04-06 DIAGNOSIS — I10 Essential (primary) hypertension: Secondary | ICD-10-CM

## 2013-04-06 DIAGNOSIS — E1139 Type 2 diabetes mellitus with other diabetic ophthalmic complication: Secondary | ICD-10-CM

## 2013-04-13 ENCOUNTER — Ambulatory Visit (INDEPENDENT_AMBULATORY_CARE_PROVIDER_SITE_OTHER): Payer: Medicaid Other | Admitting: Ophthalmology

## 2013-04-13 DIAGNOSIS — I10 Essential (primary) hypertension: Secondary | ICD-10-CM

## 2013-04-13 DIAGNOSIS — E11311 Type 2 diabetes mellitus with unspecified diabetic retinopathy with macular edema: Secondary | ICD-10-CM

## 2013-04-13 DIAGNOSIS — H35039 Hypertensive retinopathy, unspecified eye: Secondary | ICD-10-CM

## 2013-04-13 DIAGNOSIS — E1039 Type 1 diabetes mellitus with other diabetic ophthalmic complication: Secondary | ICD-10-CM

## 2013-04-13 DIAGNOSIS — H2510 Age-related nuclear cataract, unspecified eye: Secondary | ICD-10-CM

## 2013-04-13 DIAGNOSIS — E11359 Type 2 diabetes mellitus with proliferative diabetic retinopathy without macular edema: Secondary | ICD-10-CM

## 2013-04-13 DIAGNOSIS — H43819 Vitreous degeneration, unspecified eye: Secondary | ICD-10-CM

## 2013-05-24 ENCOUNTER — Encounter (INDEPENDENT_AMBULATORY_CARE_PROVIDER_SITE_OTHER): Payer: Medicaid Other | Admitting: Ophthalmology

## 2013-05-24 DIAGNOSIS — H43819 Vitreous degeneration, unspecified eye: Secondary | ICD-10-CM

## 2013-05-24 DIAGNOSIS — H35039 Hypertensive retinopathy, unspecified eye: Secondary | ICD-10-CM

## 2013-05-24 DIAGNOSIS — E11311 Type 2 diabetes mellitus with unspecified diabetic retinopathy with macular edema: Secondary | ICD-10-CM

## 2013-05-24 DIAGNOSIS — E11359 Type 2 diabetes mellitus with proliferative diabetic retinopathy without macular edema: Secondary | ICD-10-CM

## 2013-05-24 DIAGNOSIS — I10 Essential (primary) hypertension: Secondary | ICD-10-CM

## 2013-05-24 DIAGNOSIS — E1039 Type 1 diabetes mellitus with other diabetic ophthalmic complication: Secondary | ICD-10-CM

## 2013-06-28 ENCOUNTER — Ambulatory Visit (HOSPITAL_COMMUNITY)
Admission: RE | Admit: 2013-06-28 | Discharge: 2013-06-28 | Disposition: A | Discharge status: Home / Self Care | Payer: Medicaid Other | Source: Ambulatory Visit | Attending: Family Medicine | Admitting: Family Medicine

## 2013-06-28 ENCOUNTER — Other Ambulatory Visit (HOSPITAL_COMMUNITY): Payer: Self-pay | Admitting: Family Medicine

## 2013-06-28 DIAGNOSIS — G8929 Other chronic pain: Secondary | ICD-10-CM

## 2013-06-28 DIAGNOSIS — M545 Low back pain, unspecified: Secondary | ICD-10-CM

## 2013-06-28 DIAGNOSIS — M47817 Spondylosis without myelopathy or radiculopathy, lumbosacral region: Secondary | ICD-10-CM | POA: Insufficient documentation

## 2013-06-28 DIAGNOSIS — IMO0002 Reserved for concepts with insufficient information to code with codable children: Secondary | ICD-10-CM

## 2013-07-05 ENCOUNTER — Encounter (INDEPENDENT_AMBULATORY_CARE_PROVIDER_SITE_OTHER): Payer: Medicaid Other | Admitting: Ophthalmology

## 2013-07-05 DIAGNOSIS — E1065 Type 1 diabetes mellitus with hyperglycemia: Secondary | ICD-10-CM

## 2013-07-05 DIAGNOSIS — H43819 Vitreous degeneration, unspecified eye: Secondary | ICD-10-CM

## 2013-07-05 DIAGNOSIS — H35039 Hypertensive retinopathy, unspecified eye: Secondary | ICD-10-CM

## 2013-07-05 DIAGNOSIS — E11311 Type 2 diabetes mellitus with unspecified diabetic retinopathy with macular edema: Secondary | ICD-10-CM

## 2013-07-05 DIAGNOSIS — I10 Essential (primary) hypertension: Secondary | ICD-10-CM

## 2013-07-05 DIAGNOSIS — E11359 Type 2 diabetes mellitus with proliferative diabetic retinopathy without macular edema: Secondary | ICD-10-CM

## 2013-07-05 DIAGNOSIS — E1039 Type 1 diabetes mellitus with other diabetic ophthalmic complication: Secondary | ICD-10-CM

## 2013-08-16 ENCOUNTER — Encounter (INDEPENDENT_AMBULATORY_CARE_PROVIDER_SITE_OTHER): Payer: Medicaid Other | Admitting: Ophthalmology

## 2013-08-16 DIAGNOSIS — E11311 Type 2 diabetes mellitus with unspecified diabetic retinopathy with macular edema: Secondary | ICD-10-CM

## 2013-08-16 DIAGNOSIS — E11359 Type 2 diabetes mellitus with proliferative diabetic retinopathy without macular edema: Secondary | ICD-10-CM

## 2013-08-16 DIAGNOSIS — I10 Essential (primary) hypertension: Secondary | ICD-10-CM

## 2013-08-16 DIAGNOSIS — E1039 Type 1 diabetes mellitus with other diabetic ophthalmic complication: Secondary | ICD-10-CM

## 2013-08-16 DIAGNOSIS — H35039 Hypertensive retinopathy, unspecified eye: Secondary | ICD-10-CM

## 2013-08-16 DIAGNOSIS — H43819 Vitreous degeneration, unspecified eye: Secondary | ICD-10-CM

## 2013-08-16 DIAGNOSIS — E1065 Type 1 diabetes mellitus with hyperglycemia: Secondary | ICD-10-CM

## 2013-09-09 ENCOUNTER — Other Ambulatory Visit (HOSPITAL_COMMUNITY): Payer: Self-pay | Admitting: Family Medicine

## 2013-09-09 ENCOUNTER — Other Ambulatory Visit: Payer: Self-pay | Admitting: *Deleted

## 2013-09-09 DIAGNOSIS — G8929 Other chronic pain: Secondary | ICD-10-CM

## 2013-09-09 DIAGNOSIS — M545 Low back pain: Principal | ICD-10-CM

## 2013-09-26 ENCOUNTER — Encounter (HOSPITAL_COMMUNITY): Payer: Self-pay | Admitting: Pharmacy Technician

## 2013-09-27 ENCOUNTER — Encounter (INDEPENDENT_AMBULATORY_CARE_PROVIDER_SITE_OTHER): Payer: Medicaid Other | Admitting: Ophthalmology

## 2013-09-27 DIAGNOSIS — E1165 Type 2 diabetes mellitus with hyperglycemia: Secondary | ICD-10-CM

## 2013-09-27 DIAGNOSIS — E11311 Type 2 diabetes mellitus with unspecified diabetic retinopathy with macular edema: Secondary | ICD-10-CM

## 2013-09-27 DIAGNOSIS — I10 Essential (primary) hypertension: Secondary | ICD-10-CM

## 2013-09-27 DIAGNOSIS — H43819 Vitreous degeneration, unspecified eye: Secondary | ICD-10-CM

## 2013-09-27 DIAGNOSIS — E1139 Type 2 diabetes mellitus with other diabetic ophthalmic complication: Secondary | ICD-10-CM

## 2013-09-27 DIAGNOSIS — E11359 Type 2 diabetes mellitus with proliferative diabetic retinopathy without macular edema: Secondary | ICD-10-CM

## 2013-09-27 DIAGNOSIS — H35039 Hypertensive retinopathy, unspecified eye: Secondary | ICD-10-CM

## 2013-09-27 NOTE — Pre-Procedure Instructions (Signed)
Allison [name]  09/27/2013   Your procedure is scheduled on:  09/26/13  Report to [facility] short stay admitting at 600 AM.  Call this number if you have problems the morning of surgery: [phone]   Remember:   Do not eat food or drink liquids after midnight.   Take these medicines the morning of surgery with A SIP OF WATER pain med,xanax, inhaler if needed(bring inhaler to hospital),prolosec               Take meds as ordered until day of surgery except as instructed below or per dr             Allison [name] all herbel meds, nsaids (aleve,naproxen,advil,ibuprofen) 5 days prior to surgery including aspirin, vitamins      NO DIABETIC MEDS DAY OF SURGERY   Do not wear jewelry, make-up or nail polish.  Do not wear lotions, powders, or perfumes. You may wear deodorant.  Do not shave 48 hours prior to surgery. Men may shave face and neck.  Do not bring valuables to the hospital.  [facility] is not responsible                  for any belongings or valuables.               Contacts, dentures or bridgework may not be worn into surgery.  Leave suitcase in the car. After surgery it may be brought to your room.  For patients admitted to the hospital, discharge time is determined by your                treatment team.               Patients discharged the day of surgery will not be allowed to drive  home.  Name and phone number of your driver:   Special Instructions:  Special Instructions: [place] - Preparing for Surgery  Before surgery, you can play an important role.  Because skin is not sterile, your skin needs to be as free of germs as possible.  You can reduce the number of germs on you skin by washing with CHG (chlorahexidine gluconate) soap before surgery.  CHG is an antiseptic cleaner which kills germs and bonds with the skin to continue killing germs even after washing.  Please DO NOT use if you have an allergy to CHG or antibacterial soaps.  If your skin becomes reddened/irritated stop  using the CHG and inform your nurse when you arrive at Short Stay.  Do not shave (including legs and underarms) for at least 48 hours prior to the first CHG shower.  You may shave your face.  Please follow these instructions carefully:   1.  Shower with CHG Soap the night before surgery and the morning of Surgery.  2.  If you choose to wash your hair, wash your hair first as usual with your normal shampoo.  3.  After you shampoo, rinse your hair and body thoroughly to remove the Shampoo.  4.  Use CHG as you would any other liquid soap.  You can apply chg directly  to the skin and wash gently with scrungie or a clean washcloth.  5.  Apply the CHG Soap to your body ONLY FROM THE NECK DOWN.  Do not use on open wounds or open sores.  Avoid contact with your eyes ears, mouth and genitals (private parts).  Wash genitals (private parts)       with your normal soap.  6.  Wash thoroughly, paying special attention to the area where your surgery will be performed.  7.  Thoroughly rinse your body with warm water from the neck down.  8.  DO NOT shower/wash with your normal soap after using and rinsing off the CHG Soap.  9.  Pat yourself dry with a clean towel.            10.  Wear clean pajamas.            11.  Place clean sheets on your bed the night of your first shower and do not sleep with pets.  Day of Surgery  Do not apply any lotions/deodorants the morning of surgery.  Please wear clean clothes to the hospital/surgery center.   Please read over the following fact sheets that you were given: Pain Booklet, Coughing and Deep Breathing and Surgical Site Infection Prevention

## 2013-09-28 ENCOUNTER — Inpatient Hospital Stay (HOSPITAL_COMMUNITY)
Admission: RE | Admit: 2013-09-28 | Discharge: 2013-09-28 | Disposition: A | Discharge status: Home / Self Care | Payer: Medicaid Other | Source: Ambulatory Visit

## 2013-09-28 ENCOUNTER — Other Ambulatory Visit (HOSPITAL_COMMUNITY): Payer: Self-pay | Admitting: *Deleted

## 2013-09-28 NOTE — Progress Notes (Signed)
Patient canceled preadmit appt. Stated "I may need to reschedule my procedure. I have been sick."

## 2013-10-05 ENCOUNTER — Encounter (HOSPITAL_COMMUNITY): Payer: Self-pay | Admitting: Certified Registered Nurse Anesthetist

## 2013-10-06 ENCOUNTER — Encounter (HOSPITAL_COMMUNITY): Payer: Self-pay | Admitting: Certified Registered Nurse Anesthetist

## 2013-10-06 ENCOUNTER — Ambulatory Visit (HOSPITAL_COMMUNITY): Admission: RE | Admit: 2013-10-06 | Payer: Medicaid Other | Source: Ambulatory Visit | Admitting: Family Medicine

## 2013-10-06 ENCOUNTER — Encounter (HOSPITAL_COMMUNITY): Admission: RE | Payer: Self-pay | Source: Ambulatory Visit

## 2013-10-06 ENCOUNTER — Ambulatory Visit (HOSPITAL_COMMUNITY): Admission: RE | Admit: 2013-10-06 | Payer: Medicaid Other | Source: Ambulatory Visit

## 2013-10-06 ENCOUNTER — Ambulatory Visit (HOSPITAL_COMMUNITY): Payer: Medicaid Other

## 2013-10-06 SURGERY — RADIOLOGY WITH ANESTHESIA
Anesthesia: General

## 2013-11-09 ENCOUNTER — Encounter (INDEPENDENT_AMBULATORY_CARE_PROVIDER_SITE_OTHER): Payer: Medicaid Other | Admitting: Ophthalmology

## 2013-11-10 ENCOUNTER — Encounter (INDEPENDENT_AMBULATORY_CARE_PROVIDER_SITE_OTHER): Payer: Medicaid Other | Admitting: Ophthalmology

## 2013-11-10 DIAGNOSIS — E1039 Type 1 diabetes mellitus with other diabetic ophthalmic complication: Secondary | ICD-10-CM

## 2013-11-10 DIAGNOSIS — E11311 Type 2 diabetes mellitus with unspecified diabetic retinopathy with macular edema: Secondary | ICD-10-CM

## 2013-11-10 DIAGNOSIS — E11359 Type 2 diabetes mellitus with proliferative diabetic retinopathy without macular edema: Secondary | ICD-10-CM

## 2013-11-10 DIAGNOSIS — H35039 Hypertensive retinopathy, unspecified eye: Secondary | ICD-10-CM

## 2013-11-10 DIAGNOSIS — H2510 Age-related nuclear cataract, unspecified eye: Secondary | ICD-10-CM

## 2013-11-10 DIAGNOSIS — H43819 Vitreous degeneration, unspecified eye: Secondary | ICD-10-CM

## 2013-11-10 DIAGNOSIS — I10 Essential (primary) hypertension: Secondary | ICD-10-CM

## 2013-11-10 DIAGNOSIS — E1065 Type 1 diabetes mellitus with hyperglycemia: Secondary | ICD-10-CM

## 2013-12-01 ENCOUNTER — Encounter (INDEPENDENT_AMBULATORY_CARE_PROVIDER_SITE_OTHER): Payer: Medicaid Other | Admitting: Ophthalmology

## 2013-12-01 DIAGNOSIS — I10 Essential (primary) hypertension: Secondary | ICD-10-CM

## 2013-12-01 DIAGNOSIS — E11311 Type 2 diabetes mellitus with unspecified diabetic retinopathy with macular edema: Secondary | ICD-10-CM

## 2013-12-01 DIAGNOSIS — H2510 Age-related nuclear cataract, unspecified eye: Secondary | ICD-10-CM

## 2013-12-01 DIAGNOSIS — H35039 Hypertensive retinopathy, unspecified eye: Secondary | ICD-10-CM

## 2013-12-01 DIAGNOSIS — E1065 Type 1 diabetes mellitus with hyperglycemia: Secondary | ICD-10-CM

## 2013-12-01 DIAGNOSIS — E11359 Type 2 diabetes mellitus with proliferative diabetic retinopathy without macular edema: Secondary | ICD-10-CM

## 2013-12-01 DIAGNOSIS — H43819 Vitreous degeneration, unspecified eye: Secondary | ICD-10-CM

## 2013-12-01 DIAGNOSIS — E1039 Type 1 diabetes mellitus with other diabetic ophthalmic complication: Secondary | ICD-10-CM

## 2013-12-26 ENCOUNTER — Encounter (INDEPENDENT_AMBULATORY_CARE_PROVIDER_SITE_OTHER): Payer: Medicaid Other | Admitting: Ophthalmology

## 2014-01-12 ENCOUNTER — Encounter (INDEPENDENT_AMBULATORY_CARE_PROVIDER_SITE_OTHER): Payer: Medicaid Other | Admitting: Ophthalmology

## 2014-01-12 DIAGNOSIS — H35039 Hypertensive retinopathy, unspecified eye: Secondary | ICD-10-CM

## 2014-01-12 DIAGNOSIS — E1039 Type 1 diabetes mellitus with other diabetic ophthalmic complication: Secondary | ICD-10-CM

## 2014-01-12 DIAGNOSIS — I10 Essential (primary) hypertension: Secondary | ICD-10-CM

## 2014-01-12 DIAGNOSIS — E11311 Type 2 diabetes mellitus with unspecified diabetic retinopathy with macular edema: Secondary | ICD-10-CM

## 2014-01-12 DIAGNOSIS — E1065 Type 1 diabetes mellitus with hyperglycemia: Secondary | ICD-10-CM

## 2014-01-12 DIAGNOSIS — H43819 Vitreous degeneration, unspecified eye: Secondary | ICD-10-CM

## 2014-01-12 DIAGNOSIS — E11359 Type 2 diabetes mellitus with proliferative diabetic retinopathy without macular edema: Secondary | ICD-10-CM

## 2014-02-24 ENCOUNTER — Encounter (INDEPENDENT_AMBULATORY_CARE_PROVIDER_SITE_OTHER): Payer: Medicaid Other | Admitting: Ophthalmology

## 2014-02-24 DIAGNOSIS — I10 Essential (primary) hypertension: Secondary | ICD-10-CM

## 2014-02-24 DIAGNOSIS — H43813 Vitreous degeneration, bilateral: Secondary | ICD-10-CM

## 2014-02-24 DIAGNOSIS — E10311 Type 1 diabetes mellitus with unspecified diabetic retinopathy with macular edema: Secondary | ICD-10-CM

## 2014-02-24 DIAGNOSIS — E10351 Type 1 diabetes mellitus with proliferative diabetic retinopathy with macular edema: Secondary | ICD-10-CM

## 2014-02-24 DIAGNOSIS — H35033 Hypertensive retinopathy, bilateral: Secondary | ICD-10-CM

## 2014-04-18 ENCOUNTER — Encounter (INDEPENDENT_AMBULATORY_CARE_PROVIDER_SITE_OTHER): Payer: Medicaid Other | Admitting: Ophthalmology

## 2014-04-18 DIAGNOSIS — H43813 Vitreous degeneration, bilateral: Secondary | ICD-10-CM

## 2014-04-18 DIAGNOSIS — I10 Essential (primary) hypertension: Secondary | ICD-10-CM

## 2014-04-18 DIAGNOSIS — E11359 Type 2 diabetes mellitus with proliferative diabetic retinopathy without macular edema: Secondary | ICD-10-CM

## 2014-04-18 DIAGNOSIS — E11351 Type 2 diabetes mellitus with proliferative diabetic retinopathy with macular edema: Secondary | ICD-10-CM

## 2014-04-18 DIAGNOSIS — H35033 Hypertensive retinopathy, bilateral: Secondary | ICD-10-CM

## 2014-04-18 DIAGNOSIS — E10311 Type 1 diabetes mellitus with unspecified diabetic retinopathy with macular edema: Secondary | ICD-10-CM

## 2014-05-30 ENCOUNTER — Encounter (INDEPENDENT_AMBULATORY_CARE_PROVIDER_SITE_OTHER): Payer: Medicaid Other | Admitting: Ophthalmology

## 2014-05-30 DIAGNOSIS — I10 Essential (primary) hypertension: Secondary | ICD-10-CM

## 2014-05-30 DIAGNOSIS — H35033 Hypertensive retinopathy, bilateral: Secondary | ICD-10-CM

## 2014-05-30 DIAGNOSIS — E10331 Type 1 diabetes mellitus with moderate nonproliferative diabetic retinopathy with macular edema: Secondary | ICD-10-CM

## 2014-05-30 DIAGNOSIS — H43813 Vitreous degeneration, bilateral: Secondary | ICD-10-CM

## 2014-05-30 DIAGNOSIS — E10311 Type 1 diabetes mellitus with unspecified diabetic retinopathy with macular edema: Secondary | ICD-10-CM

## 2014-07-11 ENCOUNTER — Encounter (INDEPENDENT_AMBULATORY_CARE_PROVIDER_SITE_OTHER): Payer: Medicaid Other | Admitting: Ophthalmology

## 2014-07-12 ENCOUNTER — Encounter (INDEPENDENT_AMBULATORY_CARE_PROVIDER_SITE_OTHER): Payer: Medicaid Other | Admitting: Ophthalmology

## 2014-07-21 ENCOUNTER — Encounter (INDEPENDENT_AMBULATORY_CARE_PROVIDER_SITE_OTHER): Payer: Medicaid Other | Admitting: Ophthalmology

## 2014-07-21 DIAGNOSIS — E10311 Type 1 diabetes mellitus with unspecified diabetic retinopathy with macular edema: Secondary | ICD-10-CM

## 2014-07-21 DIAGNOSIS — E10359 Type 1 diabetes mellitus with proliferative diabetic retinopathy without macular edema: Secondary | ICD-10-CM

## 2014-07-21 DIAGNOSIS — H43813 Vitreous degeneration, bilateral: Secondary | ICD-10-CM

## 2014-07-21 DIAGNOSIS — I10 Essential (primary) hypertension: Secondary | ICD-10-CM

## 2014-07-21 DIAGNOSIS — H35033 Hypertensive retinopathy, bilateral: Secondary | ICD-10-CM

## 2014-08-28 ENCOUNTER — Encounter (INDEPENDENT_AMBULATORY_CARE_PROVIDER_SITE_OTHER): Payer: Medicaid Other | Admitting: Ophthalmology

## 2014-08-28 DIAGNOSIS — I10 Essential (primary) hypertension: Secondary | ICD-10-CM

## 2014-08-28 DIAGNOSIS — H43813 Vitreous degeneration, bilateral: Secondary | ICD-10-CM | POA: Diagnosis not present

## 2014-08-28 DIAGNOSIS — E10351 Type 1 diabetes mellitus with proliferative diabetic retinopathy with macular edema: Secondary | ICD-10-CM

## 2014-08-28 DIAGNOSIS — H35033 Hypertensive retinopathy, bilateral: Secondary | ICD-10-CM

## 2014-08-28 DIAGNOSIS — E10311 Type 1 diabetes mellitus with unspecified diabetic retinopathy with macular edema: Secondary | ICD-10-CM | POA: Diagnosis not present

## 2014-08-28 DIAGNOSIS — E10359 Type 1 diabetes mellitus with proliferative diabetic retinopathy without macular edema: Secondary | ICD-10-CM

## 2014-09-01 ENCOUNTER — Encounter (INDEPENDENT_AMBULATORY_CARE_PROVIDER_SITE_OTHER): Payer: Medicaid Other | Admitting: Ophthalmology

## 2014-10-04 ENCOUNTER — Encounter: Payer: Self-pay | Admitting: Obstetrics and Gynecology

## 2014-10-04 ENCOUNTER — Ambulatory Visit (INDEPENDENT_AMBULATORY_CARE_PROVIDER_SITE_OTHER): Payer: Medicaid Other | Admitting: Obstetrics and Gynecology

## 2014-10-04 VITALS — BP 130/76 | Ht 62.5 in | Wt 158.0 lb

## 2014-10-04 DIAGNOSIS — Z0000 Encounter for general adult medical examination without abnormal findings: Secondary | ICD-10-CM

## 2014-10-04 DIAGNOSIS — R232 Flushing: Secondary | ICD-10-CM

## 2014-10-04 DIAGNOSIS — Z01419 Encounter for gynecological examination (general) (routine) without abnormal findings: Secondary | ICD-10-CM

## 2014-10-04 MED ORDER — ESTRADIOL 0.1 MG/24HR TD PTTW: 1.0000 | MEDICATED_PATCH | TRANSDERMAL | Status: DC

## 2014-10-04 NOTE — Progress Notes (Signed)
Patient ID: Allison [name], female   DOB: 29-Jun-1970, 44 y.o.   MRN: [id number] Pt here today for annual exam. Pt has had a partial hysterectomy. Pt states that she has a lot of mood swings.

## 2014-10-04 NOTE — Progress Notes (Signed)
Patient ID: Allison [name], female   DOB: [birth year], 44 y.o.   MRN: [id number]  Assessment:  Annual Gyn Exam Hot flashes probably due to beginning of menopause Plan:  1. pap smear not done, partial hysterectomy done 2. return annually or prn 3    Annual mammogram advised. 4.   Trial of hormone patch (Vivelle-Dot) and f/u in 6 weeks   Subjective:  Allison [name] is a 44 y.o. female No obstetric history on file. who presents for annual exam. No LMP recorded. Patient has had a hysterectomy. The patient is here for her annual exam. She has complaints today of mood swings and hot flashes. She states she feels extremely stressed, and will oscillate quickly between "feeling fine one minute, and then wanting to cry." She also notes diaphoresis around her posterior neck and head that occur after eating.  Patient adds that she quit smoking for the 5 months, as she has been losing her eyesight. She has to have steroid injections into her eyes every several weeks, and she has recently been diagnosed with glaucoma. Dr. Guy Sandifer?) performed a laser surgery on her eyes and Dr. [name] performs her eye injections.   She experiences mild dyspareunia.   Patient's last mammogram occurred last year.  The following portions of the patient's history were reviewed and updated as appropriate: allergies, current medications, past family history, past medical history, past social history, past surgical history and problem list. Past Medical History  Diagnosis Date  . Insulin dependent diabetes mellitus   . Hypertension   . Gastroparesis     possible  . Neuropathy   . Retinopathy   . Dyslipidemia   . Glaucoma   . 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 Cancer     cervical     Past Surgical History  Procedure Laterality Date  . Abdominal hysterectomy      partial   . Eye surgery      multiple     Current outpatient prescriptions:  .  albuterol (PROAIR HFA) 108 (90 BASE) MCG/ACT  inhaler, Inhale 2 puffs into the lungs every 4 (four) hours as needed for wheezing or shortness of breath., Disp: , Rfl:  .  ALPRAZolam (XANAX) 1 MG tablet, Take 1 mg by mouth 3 (three) times daily as needed for anxiety. , Disp: , Rfl:  .  Canagliflozin (INVOKANA) 300 MG TABS, Take 300 mg by mouth daily., Disp: , Rfl:  .  furosemide (LASIX) 20 MG tablet, Take 20 mg by mouth 2 (two) times daily as needed. , Disp: , Rfl:  .  HYDROcodone-acetaminophen (NORCO) 10-325 MG per tablet, Take 1 tablet by mouth every 4 (four) hours as needed for severe pain., Disp: , Rfl:  .  insulin glargine (LANTUS) 100 UNIT/ML injection, Inject 30 Units into the skin at bedtime. , Disp: , Rfl:  .  loratadine (CLARITIN) 10 MG tablet, Take 10 mg by mouth daily., Disp: , Rfl:  .  omeprazole (PRILOSEC) 20 MG capsule, Take 20 mg by mouth 2 (two) times daily before a meal. , Disp: , Rfl:  .  potassium chloride SA (K-DUR,KLOR-CON) 20 MEQ tablet, Take 20 mEq by mouth 2 (two) times daily. , Disp: , Rfl:  .  aspirin EC 81 MG tablet, Take 81 mg by mouth daily., Disp: , Rfl:   Review of Systems Constitutional: hot flashes, diaphoresis Gastrointestinal: negative Genitourinary: negative A complete review of systems was obtained and all systems are negative except as noted  in the HPI and PMH.    Objective:  BP 130/76 mmHg  Ht 5' 2.5" (1.588 m)  Wt 158 lb (71.668 kg)  BMI 28.42 kg/m2   BMI: Body mass index is 28.42 kg/(m^2).  General Appearance: Alert, appropriate appearance for age. No acute distress HEENT: Grossly normal Neck / Thyroid:  Cardiovascular: RRR; normal S1, S2, no murmur Lungs: CTA bilaterally Back: No CVAT Breast Exam: No dimpling, nipple retraction or discharge. No masses or nodes., Normal to inspection, Normal breast tissue bilaterally Gastrointestinal: Soft, non-tender, no masses or organomegaly Pelvic Exam: External genitalia: normal general appearance Urinary system: urethral meatus normal Vaginal:  normal mucosa without prolapse or lesions, normal without tenderness, induration or masses and normal rugae, good support S/P posterior repair; good bladder support Cervix: removed surgically Adnexa: normal bimanual exam Uterus: removed surgically Rectal: good sphincter tone, no masses and guaiac negative Rectovaginal: not indicated Lymphatic Exam: Non-palpable nodes in neck, clavicular, axillary, or inguinal regions  Skin: no rash or abnormalities Neurologic: Normal gait and speech, no tremor  Psychiatric: Alert and oriented, appropriate affect.  Urinalysis:Not done  Mallory Shirk. MD Pgr [phone] 10:11 AM   This chart was SCRIBED for Mallory Shirk, MD by Stephania Fragmin, ED Scribe. This patient was seen in room 3 and the patient's care was started at 10:12 AM.  I personally performed the services described in this documentation, which was SCRIBED in my presence. The recorded information has been reviewed and considered accurate. It has been edited as necessary during review. Jonnie Kind, MD

## 2014-10-11 ENCOUNTER — Encounter (INDEPENDENT_AMBULATORY_CARE_PROVIDER_SITE_OTHER): Payer: Medicaid Other | Admitting: Ophthalmology

## 2014-10-11 DIAGNOSIS — H35033 Hypertensive retinopathy, bilateral: Secondary | ICD-10-CM

## 2014-10-11 DIAGNOSIS — I10 Essential (primary) hypertension: Secondary | ICD-10-CM

## 2014-10-11 DIAGNOSIS — H43813 Vitreous degeneration, bilateral: Secondary | ICD-10-CM | POA: Diagnosis not present

## 2014-10-11 DIAGNOSIS — E10351 Type 1 diabetes mellitus with proliferative diabetic retinopathy with macular edema: Secondary | ICD-10-CM | POA: Diagnosis not present

## 2014-10-11 DIAGNOSIS — E10311 Type 1 diabetes mellitus with unspecified diabetic retinopathy with macular edema: Secondary | ICD-10-CM

## 2014-10-13 ENCOUNTER — Encounter: Payer: Medicaid Other | Attending: "Endocrinology | Admitting: Nutrition

## 2014-10-13 ENCOUNTER — Encounter: Payer: Self-pay | Admitting: Nutrition

## 2014-10-13 VITALS — Ht 62.5 in | Wt 158.0 lb

## 2014-10-13 DIAGNOSIS — Z713 Dietary counseling and surveillance: Secondary | ICD-10-CM | POA: Insufficient documentation

## 2014-10-13 DIAGNOSIS — Z794 Long term (current) use of insulin: Secondary | ICD-10-CM | POA: Insufficient documentation

## 2014-10-13 DIAGNOSIS — E118 Type 2 diabetes mellitus with unspecified complications: Secondary | ICD-10-CM | POA: Diagnosis present

## 2014-10-13 DIAGNOSIS — E1165 Type 2 diabetes mellitus with hyperglycemia: Secondary | ICD-10-CM

## 2014-10-13 DIAGNOSIS — IMO0002 Reserved for concepts with insufficient information to code with codable children: Secondary | ICD-10-CM

## 2014-10-13 NOTE — Progress Notes (Signed)
  Medical Nutrition Therapy:  Appt start time: 1000 end time:  1100.  Assessment:  Primary concerns today: DIabetesType 1 v s 2? and weight loss. A1C 7.6%.,, down, from 14.7% in the past. Llives by herself.  She doesn't cook a lot. Eats out a lot. PMH: Complicated- Hyperlipidemia, DM, HTN, Retinopathy and has a lot of anxiety she notes. Home situation is stressful she notes. Is not working.Tried weight loss drug and it didn't work. Retinopathy.  Has back issues. Doesn't exercise.  Lantus 30 units at night. Tends to eat or snack throughout the day but doesn't eat planned scheduled meals. Eats a lot of fast foods. Has been gaining weight steadily she reports. She quit smoking three months ago.  Preferred Learning Style:   Auditory  Hands on  Learning Readiness:   Ready  Change in progress  MEDICATIONS: See list   DIETARY INTAKE:   24-hr recall:  B ( AM): Skips Snk ( AM): chips, L ( PM):  Chicken biscuit, water Snk ( PM) chips, misc junk food D ( PM): [place] chicken with rice, water Snk ( PM): chips, Beverages: water  Usual physical activity:  Estimated energy needs: 1500  calories 170  g carbohydrates 112 g protein 42 g fat  Progress Towards Goal(s):  In progress.   Nutritional Diagnosis:  NB-1.1 Food and nutrition-related knowledge deficit As related to Diabetes.  As evidenced by A1c 7.6%..    Intervention:  Nutrition and diabetes education on disease,  Meal planning, SGB,, target ranges for meals, complications of DM, CHO Counting, portion sizes, exercise.  Goal:  1. Follow the Plate Method 2. Increase fresh fruits and vegetables. 3. Cut out snacks between meals unless a low blood sugar. 4. Eat 2-3 carb choices per meal. 5. Cut out junk food, fast food and processed foods. 6. Exercise 30-60 minutes 3-4 times per week. 7. Get A1C down to 7% in three months. 8. Lose 1 lb per week til next visit.  Teaching Method Utilized: Visual Auditory Hands on  Handouts  given during visit include:  The Plate Method  The Meal Plan Card  Diabetes instructions    Barriers to learning/adherence to lifestyle change: none  Demonstrated degree of understanding via:  Teach Back   Monitoring/Evaluation:  Dietary intake, exercise, meal planning, SBG, and body weight in 1 month(s).

## 2014-10-16 NOTE — Patient Instructions (Signed)
Goal:  1. Follow the Plate Method 2. Increase fresh fruits and vegetables. 3. Cut out snacks between meals unless a low blood sugar. 4. Eat 2-3 carb choices per meal. 5. Cut out junk food, fast food and processed foods. 6. Exercise 30-60 minutes 3-4 times per week. 7. Get A1C down to 7% in three months. 8. Lose 1 lb per week til next visit

## 2014-10-31 ENCOUNTER — Encounter (INDEPENDENT_AMBULATORY_CARE_PROVIDER_SITE_OTHER): Payer: Medicaid Other | Admitting: Ophthalmology

## 2014-11-13 ENCOUNTER — Other Ambulatory Visit (HOSPITAL_COMMUNITY): Payer: Self-pay | Admitting: Family Medicine

## 2014-11-13 ENCOUNTER — Ambulatory Visit (HOSPITAL_COMMUNITY)
Admission: RE | Admit: 2014-11-13 | Discharge: 2014-11-13 | Disposition: A | Discharge status: Home / Self Care | Payer: Medicaid Other | Source: Ambulatory Visit | Attending: Family Medicine | Admitting: Family Medicine

## 2014-11-13 DIAGNOSIS — R079 Chest pain, unspecified: Secondary | ICD-10-CM | POA: Insufficient documentation

## 2014-11-13 DIAGNOSIS — Z87891 Personal history of nicotine dependence: Secondary | ICD-10-CM | POA: Diagnosis not present

## 2014-11-14 ENCOUNTER — Encounter (INDEPENDENT_AMBULATORY_CARE_PROVIDER_SITE_OTHER): Payer: Medicaid Other | Admitting: Ophthalmology

## 2014-11-14 DIAGNOSIS — E10311 Type 1 diabetes mellitus with unspecified diabetic retinopathy with macular edema: Secondary | ICD-10-CM

## 2014-11-14 DIAGNOSIS — E119 Type 2 diabetes mellitus without complications: Secondary | ICD-10-CM

## 2014-11-14 DIAGNOSIS — H35033 Hypertensive retinopathy, bilateral: Secondary | ICD-10-CM | POA: Diagnosis not present

## 2014-11-14 DIAGNOSIS — H43813 Vitreous degeneration, bilateral: Secondary | ICD-10-CM

## 2014-11-14 DIAGNOSIS — E10351 Type 1 diabetes mellitus with proliferative diabetic retinopathy with macular edema: Secondary | ICD-10-CM | POA: Diagnosis not present

## 2014-11-14 DIAGNOSIS — I10 Essential (primary) hypertension: Secondary | ICD-10-CM

## 2014-11-15 ENCOUNTER — Ambulatory Visit: Payer: Medicaid Other | Admitting: Obstetrics and Gynecology

## 2014-11-15 ENCOUNTER — Encounter (INDEPENDENT_AMBULATORY_CARE_PROVIDER_SITE_OTHER): Payer: Medicaid Other | Admitting: Ophthalmology

## 2014-11-23 ENCOUNTER — Ambulatory Visit: Payer: Medicaid Other | Admitting: Obstetrics and Gynecology

## 2014-12-08 ENCOUNTER — Ambulatory Visit: Payer: Medicaid Other | Admitting: Obstetrics and Gynecology

## 2014-12-11 ENCOUNTER — Ambulatory Visit: Payer: Medicaid Other | Admitting: Nutrition

## 2014-12-14 ENCOUNTER — Ambulatory Visit: Payer: Medicaid Other | Admitting: Obstetrics and Gynecology

## 2015-01-09 ENCOUNTER — Encounter (INDEPENDENT_AMBULATORY_CARE_PROVIDER_SITE_OTHER): Payer: Medicaid Other | Admitting: Ophthalmology

## 2015-01-09 DIAGNOSIS — E10359 Type 1 diabetes mellitus with proliferative diabetic retinopathy without macular edema: Secondary | ICD-10-CM

## 2015-01-09 DIAGNOSIS — E10319 Type 1 diabetes mellitus with unspecified diabetic retinopathy without macular edema: Secondary | ICD-10-CM | POA: Diagnosis not present

## 2015-01-09 DIAGNOSIS — I10 Essential (primary) hypertension: Secondary | ICD-10-CM | POA: Diagnosis not present

## 2015-01-09 DIAGNOSIS — H35033 Hypertensive retinopathy, bilateral: Secondary | ICD-10-CM

## 2015-01-09 DIAGNOSIS — H43813 Vitreous degeneration, bilateral: Secondary | ICD-10-CM | POA: Diagnosis not present

## 2015-01-22 ENCOUNTER — Ambulatory Visit (HOSPITAL_COMMUNITY)
Admission: RE | Admit: 2015-01-22 | Discharge: 2015-01-22 | Disposition: A | Discharge status: Home / Self Care | Payer: Medicaid Other | Source: Ambulatory Visit | Attending: Family Medicine | Admitting: Family Medicine

## 2015-01-22 ENCOUNTER — Other Ambulatory Visit (HOSPITAL_COMMUNITY): Payer: Self-pay | Admitting: Family Medicine

## 2015-01-22 DIAGNOSIS — R519 Headache, unspecified: Secondary | ICD-10-CM

## 2015-01-22 DIAGNOSIS — R0981 Nasal congestion: Secondary | ICD-10-CM | POA: Insufficient documentation

## 2015-01-22 DIAGNOSIS — R51 Headache: Principal | ICD-10-CM

## 2015-01-24 ENCOUNTER — Other Ambulatory Visit (HOSPITAL_COMMUNITY): Payer: Self-pay | Admitting: Family Medicine

## 2015-01-24 DIAGNOSIS — R51 Headache: Principal | ICD-10-CM

## 2015-01-24 DIAGNOSIS — R519 Headache, unspecified: Secondary | ICD-10-CM

## 2015-01-25 ENCOUNTER — Ambulatory Visit (HOSPITAL_COMMUNITY)
Admission: RE | Admit: 2015-01-25 | Discharge: 2015-01-25 | Disposition: A | Discharge status: Home / Self Care | Payer: Medicaid Other | Source: Ambulatory Visit | Attending: Family Medicine | Admitting: Family Medicine

## 2015-01-25 DIAGNOSIS — R51 Headache: Secondary | ICD-10-CM | POA: Insufficient documentation

## 2015-01-25 DIAGNOSIS — R519 Headache, unspecified: Secondary | ICD-10-CM

## 2015-01-30 ENCOUNTER — Encounter (INDEPENDENT_AMBULATORY_CARE_PROVIDER_SITE_OTHER): Payer: Medicaid Other | Admitting: Ophthalmology

## 2015-01-30 DIAGNOSIS — I10 Essential (primary) hypertension: Secondary | ICD-10-CM | POA: Diagnosis not present

## 2015-01-30 DIAGNOSIS — E10311 Type 1 diabetes mellitus with unspecified diabetic retinopathy with macular edema: Secondary | ICD-10-CM | POA: Diagnosis not present

## 2015-01-30 DIAGNOSIS — H43813 Vitreous degeneration, bilateral: Secondary | ICD-10-CM | POA: Diagnosis not present

## 2015-01-30 DIAGNOSIS — H35033 Hypertensive retinopathy, bilateral: Secondary | ICD-10-CM | POA: Diagnosis not present

## 2015-01-30 DIAGNOSIS — E10359 Type 1 diabetes mellitus with proliferative diabetic retinopathy without macular edema: Secondary | ICD-10-CM | POA: Diagnosis not present

## 2015-01-30 DIAGNOSIS — E10351 Type 1 diabetes mellitus with proliferative diabetic retinopathy with macular edema: Secondary | ICD-10-CM | POA: Diagnosis not present

## 2015-02-07 LAB — HEMOGLOBIN A1C
Hemoglobin A1C: 7.6
Hemoglobin A1C: 7.6

## 2015-02-14 ENCOUNTER — Ambulatory Visit (HOSPITAL_COMMUNITY)
Admission: RE | Admit: 2015-02-14 | Discharge: 2015-02-14 | Disposition: A | Discharge status: Home / Self Care | Payer: Medicaid Other | Source: Ambulatory Visit | Attending: Family Medicine | Admitting: Family Medicine

## 2015-02-14 ENCOUNTER — Other Ambulatory Visit (HOSPITAL_COMMUNITY): Payer: Self-pay | Admitting: Family Medicine

## 2015-02-14 DIAGNOSIS — R05 Cough: Secondary | ICD-10-CM | POA: Insufficient documentation

## 2015-02-14 DIAGNOSIS — R0602 Shortness of breath: Secondary | ICD-10-CM | POA: Insufficient documentation

## 2015-02-14 DIAGNOSIS — R053 Chronic cough: Secondary | ICD-10-CM

## 2015-02-14 DIAGNOSIS — Z87891 Personal history of nicotine dependence: Secondary | ICD-10-CM | POA: Insufficient documentation

## 2015-02-19 ENCOUNTER — Encounter (INDEPENDENT_AMBULATORY_CARE_PROVIDER_SITE_OTHER): Payer: Medicaid Other | Admitting: Ophthalmology

## 2015-03-14 ENCOUNTER — Encounter (INDEPENDENT_AMBULATORY_CARE_PROVIDER_SITE_OTHER): Payer: Medicaid Other | Admitting: Ophthalmology

## 2015-03-14 DIAGNOSIS — H43813 Vitreous degeneration, bilateral: Secondary | ICD-10-CM | POA: Diagnosis not present

## 2015-03-14 DIAGNOSIS — E10311 Type 1 diabetes mellitus with unspecified diabetic retinopathy with macular edema: Secondary | ICD-10-CM

## 2015-03-14 DIAGNOSIS — I10 Essential (primary) hypertension: Secondary | ICD-10-CM | POA: Diagnosis not present

## 2015-03-14 DIAGNOSIS — E103513 Type 1 diabetes mellitus with proliferative diabetic retinopathy with macular edema, bilateral: Secondary | ICD-10-CM | POA: Diagnosis not present

## 2015-03-14 DIAGNOSIS — H35033 Hypertensive retinopathy, bilateral: Secondary | ICD-10-CM | POA: Diagnosis not present

## 2015-04-20 ENCOUNTER — Other Ambulatory Visit: Payer: Self-pay | Admitting: "Endocrinology

## 2015-04-27 ENCOUNTER — Encounter (INDEPENDENT_AMBULATORY_CARE_PROVIDER_SITE_OTHER): Payer: Medicaid Other | Admitting: Ophthalmology

## 2015-04-27 DIAGNOSIS — E103513 Type 1 diabetes mellitus with proliferative diabetic retinopathy with macular edema, bilateral: Secondary | ICD-10-CM

## 2015-04-27 DIAGNOSIS — I10 Essential (primary) hypertension: Secondary | ICD-10-CM

## 2015-04-27 DIAGNOSIS — H35033 Hypertensive retinopathy, bilateral: Secondary | ICD-10-CM | POA: Diagnosis not present

## 2015-04-27 DIAGNOSIS — H43813 Vitreous degeneration, bilateral: Secondary | ICD-10-CM | POA: Diagnosis not present

## 2015-04-27 DIAGNOSIS — E10311 Type 1 diabetes mellitus with unspecified diabetic retinopathy with macular edema: Secondary | ICD-10-CM | POA: Diagnosis not present

## 2015-05-03 ENCOUNTER — Other Ambulatory Visit: Payer: Self-pay | Admitting: "Endocrinology

## 2015-05-14 ENCOUNTER — Other Ambulatory Visit: Payer: Self-pay | Admitting: "Endocrinology

## 2015-05-14 DIAGNOSIS — E1169 Type 2 diabetes mellitus with other specified complication: Principal | ICD-10-CM

## 2015-05-14 DIAGNOSIS — IMO0002 Reserved for concepts with insufficient information to code with codable children: Secondary | ICD-10-CM

## 2015-05-14 DIAGNOSIS — E1165 Type 2 diabetes mellitus with hyperglycemia: Secondary | ICD-10-CM

## 2015-05-15 LAB — COMPREHENSIVE METABOLIC PANEL
ALK PHOS: 58 IU/L (ref 39–117)
ALT: 14 IU/L (ref 0–32)
AST: 17 IU/L (ref 0–40)
Albumin/Globulin Ratio: 1.7 (ref 1.1–2.5)
Albumin: 4.3 g/dL (ref 3.5–5.5)
BUN/Creatinine Ratio: 18 (ref 9–23)
BUN: 10 mg/dL (ref 6–24)
Bilirubin Total: 0.4 mg/dL (ref 0.0–1.2)
CO2: 24 mmol/L (ref 18–29)
Calcium: 9.6 mg/dL (ref 8.7–10.2)
Chloride: 102 mmol/L (ref 96–106)
Creatinine, Ser: 0.56 mg/dL — ABNORMAL LOW (ref 0.57–1.00)
GFR calc Af Amer: 133 mL/min/{1.73_m2} (ref >59)
GFR calc non Af Amer: 115 mL/min/{1.73_m2} (ref >59)
Globulin, Total: 2.5 g/dL (ref 1.5–4.5)
Glucose: 136 mg/dL — ABNORMAL HIGH (ref 65–99)
POTASSIUM: 4.3 mmol/L (ref 3.5–5.2)
Sodium: 142 mmol/L (ref 134–144)
Total Protein: 6.8 g/dL (ref 6.0–8.5)

## 2015-05-15 LAB — HEMOGLOBIN A1C
Est. average glucose Bld gHb Est-mCnc: 157 mg/dL
Hgb A1c MFr Bld: 7.1 % — ABNORMAL HIGH (ref 4.8–5.6)

## 2015-05-16 ENCOUNTER — Encounter: Payer: Self-pay | Admitting: "Endocrinology

## 2015-05-16 ENCOUNTER — Ambulatory Visit (INDEPENDENT_AMBULATORY_CARE_PROVIDER_SITE_OTHER): Payer: Medicaid Other | Admitting: "Endocrinology

## 2015-05-16 VITALS — BP 143/85 | HR 77 | Ht 60.0 in | Wt 143.0 lb

## 2015-05-16 DIAGNOSIS — E785 Hyperlipidemia, unspecified: Secondary | ICD-10-CM

## 2015-05-16 DIAGNOSIS — Z794 Long term (current) use of insulin: Secondary | ICD-10-CM

## 2015-05-16 DIAGNOSIS — E11311 Type 2 diabetes mellitus with unspecified diabetic retinopathy with macular edema: Secondary | ICD-10-CM

## 2015-05-16 DIAGNOSIS — I10 Essential (primary) hypertension: Secondary | ICD-10-CM

## 2015-05-16 MED ORDER — CANAGLIFLOZIN 300 MG PO TABS: 300.0000 mg | ORAL_TABLET | Freq: Every day | ORAL | Status: DC

## 2015-05-16 NOTE — Progress Notes (Signed)
Subjective:    Patient ID: Allison [name], female    DOB: 04-21-1971, PCP Robert Bellow, MD   Past Medical History  Diagnosis Date  . Insulin dependent diabetes mellitus ([place])   . Hypertension   . Gastroparesis     possible  . Neuropathy ([place])   . Retinopathy   . Dyslipidemia   . Glaucoma   . 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 Cancer [facility])     cervical    Past Surgical History  Procedure Laterality Date  . Abdominal hysterectomy      partial   . Eye surgery      multiple   Social History   Social History  . Marital Status: Divorced    Spouse Name: N/A  . Number of Children: N/A  . Years of Education: N/A   Social History Main Topics  . Smoking status: Former Smoker -- 0.00 packs/day for 26 years    Types: Cigarettes    Quit date: 05/05/2014  . Smokeless tobacco: Never Used  . Alcohol Use: No  . Drug Use: No  . Sexual Activity: Not Currently    Birth Control/ Protection: Surgical   Other Topics Concern  . None   Social History Narrative   Outpatient Encounter Prescriptions as of 05/16/2015  Medication Sig  . canagliflozin (INVOKANA) 300 MG TABS tablet Take 300 mg by mouth daily.  . insulin glargine (LANTUS) 100 UNIT/ML injection Inject 20 Units into the skin at bedtime.  . [DISCONTINUED] INVOKANA 300 MG TABS tablet TAKE 1 TABLET BY MOUTH EVERY DAY  . albuterol (PROAIR HFA) 108 (90 BASE) MCG/ACT inhaler Inhale 2 puffs into the lungs every 4 (four) hours as needed for wheezing or shortness of breath.  . ALPRAZolam (XANAX) 1 MG tablet Take 1 mg by mouth 3 (three) times daily as needed for anxiety.   [name] aspirin EC 81 MG tablet Take 81 mg by mouth daily.  [name] estradiol (VIVELLE-DOT) 0.1 MG/24HR patch Place 1 patch (0.1 mg total) onto the skin 2 (two) times a week.  . fluconazole (DIFLUCAN) 150 MG tablet TAKE 1 TABLET BY MOUTH ONE TIME  . furosemide (LASIX) 20 MG tablet Take 20 mg by mouth 2 (two) times daily as needed.   [name]  HYDROcodone-acetaminophen (NORCO) 10-325 MG per tablet Take 1 tablet by mouth every 4 (four) hours as needed for severe pain.  [name] loratadine (CLARITIN) 10 MG tablet Take 10 mg by mouth daily.  . Multiple Vitamin (MULTIVITAMIN) tablet Take 1 tablet by mouth daily.  [name] omeprazole (PRILOSEC) 20 MG capsule Take 20 mg by mouth 2 (two) times daily before a meal.   . polyethylene glycol (MIRALAX / GLYCOLAX) packet Take 17 g by mouth daily.  . potassium chloride SA (K-DUR,KLOR-CON) 20 MEQ tablet Take 20 mEq by mouth 2 (two) times daily.    No facility-administered encounter medications on file as of 05/16/2015.   ALLERGIES: Allergies  Allergen Reactions  . Beta Adrenergic Blockers     Heart palpitations  . Codeine Other (See Comments)    TYLENOL #3  Welts and itching  . Cymbalta [Duloxetine Hcl]     SOB  . Doxycycline Other (See Comments)    Heart problems  . Lexapro [Escitalopram Oxalate]     Heart palpitations and hives  . Other Palpitations    Allergic to all beta blocker eye drops!!  . Oxycodone Itching   VACCINATION STATUS:  There is no immunization history on file  for this patient.  Diabetes She presents for her follow-up diabetic visit. She has type 2 diabetes mellitus. Onset time: She was diagnosed at approximate age of 68 years. Her disease course has been stable. There are no hypoglycemic associated symptoms. Pertinent negatives for hypoglycemia include no confusion, headaches, pallor or seizures. There are no diabetic associated symptoms. Pertinent negatives for diabetes include no chest pain, no polydipsia, no polyphagia and no polyuria. There are no hypoglycemic complications. Symptoms are stable. Diabetic complications include retinopathy. Risk factors for coronary artery disease include diabetes mellitus, dyslipidemia and hypertension. Current diabetic treatment includes insulin injections and oral agent (monotherapy). She is compliant with treatment most of the time. Her weight  is decreasing steadily. She is following a generally unhealthy diet. When asked about meal planning, she reported none. She never participates in exercise. There is no change in her home blood glucose trend. Her overall blood glucose range is 140-180 mg/dl. Eye exam is current.  Hyperlipidemia This is a chronic problem. The current episode started more than 1 year ago. Exacerbating diseases include diabetes. Pertinent negatives include no chest pain, myalgias or shortness of breath. Risk factors for coronary artery disease include diabetes mellitus, dyslipidemia, hypertension and a sedentary lifestyle.  Hypertension This is a chronic problem. The current episode started more than 1 year ago. Pertinent negatives include no chest pain, headaches, palpitations or shortness of breath. Risk factors for coronary artery disease include dyslipidemia, diabetes mellitus, obesity and sedentary lifestyle. Hypertensive end-organ damage includes retinopathy.     Review of Systems  Constitutional: Negative for fever, chills and unexpected weight change.  HENT: Negative for trouble swallowing and voice change.   Eyes: Negative for visual disturbance.  Respiratory: Negative for cough, shortness of breath and wheezing.   Cardiovascular: Negative for chest pain, palpitations and leg swelling.  Gastrointestinal: Negative for nausea, vomiting and diarrhea.  Endocrine: Negative for cold intolerance, heat intolerance, polydipsia, polyphagia and polyuria.  Musculoskeletal: Negative for myalgias and arthralgias.  Skin: Negative for color change, pallor, rash and wound.  Neurological: Negative for seizures and headaches.  Psychiatric/Behavioral: Negative for suicidal ideas and confusion.    Objective:    BP 143/85 mmHg  Pulse 77  Ht 5' (1.524 m)  Wt 143 lb (64.864 kg)  BMI 27.93 kg/m2  SpO2 100%  Wt Readings from Last 3 Encounters:  05/16/15 143 lb (64.864 kg)  10/13/14 158 lb (71.668 kg)  10/04/14 158 lb  (71.668 kg)    Physical Exam  Constitutional: She is oriented to person, place, and time. She appears well-developed.  HENT:  Head: Normocephalic and atraumatic.  Eyes: EOM are normal.  Neck: Normal range of motion. Neck supple. No tracheal deviation present. No thyromegaly present.  Cardiovascular: Normal rate and regular rhythm.   Pulmonary/Chest: Effort normal and breath sounds normal.  Abdominal: Soft. Bowel sounds are normal. There is no tenderness. There is no guarding.  Musculoskeletal: Normal range of motion. She exhibits no edema.  Neurological: She is alert and oriented to person, place, and time. She has normal reflexes. No cranial nerve deficit. Coordination normal.  Skin: Skin is warm and dry. No rash noted. No erythema. No pallor.  Psychiatric: She has a normal mood and affect. Judgment normal.    Results for orders placed or performed in visit on 05/16/15  Hemoglobin A1c  Result Value Ref Range   Hemoglobin A1C 14.7   Hemoglobin A1c  Result Value Ref Range   Hemoglobin A1C 7.6   Hemoglobin A1c  Result Value  Ref Range   Hemoglobin A1C 7.6    Complete Blood Count (Most recent): Lab Results  Component Value Date   WBC 7.2 09/14/2008   HGB 11.6* 09/14/2008   HCT 33.2* 09/14/2008   MCV 86.1 09/14/2008   PLT 197 09/14/2008   Chemistry (most recent): Lab Results  Component Value Date   NA 142 05/14/2015   K 4.3 05/14/2015   CL 102 05/14/2015   CO2 24 05/14/2015   BUN 10 05/14/2015   CREATININE 0.56* 05/14/2015   Diabetic Labs (most recent): Lab Results  Component Value Date   HGBA1C 7.1* 05/14/2015   HGBA1C 7.6 02/07/2015   HGBA1C 7.6 02/07/2015     Assessment & Plan:   1. 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 These are all discussed in detail with the patient.  Patient came with controlled fasting glucose profile, and  recent A1c of 7.1% overall improving from 14.7 %.   Recent labs reviewed.   - I have re-counseled the patient on diet management  by adopting a carbohydrate restricted / protein rich  Diet.  - Suggestion is made for patient to avoid simple carbohydrates   from their diet including Cakes , Desserts, Ice Cream,  Soda (  diet and regular) , Sweet Tea , Candies,  Chips, Cookies, Artificial Sweeteners,   and "Sugar-free" Products .  This will help patient to have stable blood glucose profile and potentially avoid unintended  Weight gain.  - Patient is advised to stick to a routine mealtimes to eat 3 meals  a day and avoid unnecessary snacks ( to snack only to correct hypoglycemia).  - The patient  has been  scheduled with Jearld Fenton, RDN, CDE for individualized DM education.  - I have approached patient with the following individualized plan to manage diabetes and patient agrees.  Her fasting BG readings are all on target, EAG 135.  -Her a1c is better at  7.1%,  overall improved from 14.7%. - I will continue basal insulin Lantus 20 units qhs.  -She will need strict therapy to avoid acute and chronic complications of diabetes.  -she will not need prandial insulin for now.  -continue Invokana 300mg  po qday. SE discussed with patient.  -She does not tolerate metformin, not a candidate forSGLT2 inhibitors and Incretin therapy. - Patient specific target  for A1c; LDL, HDL, Triglycerides, and  Waist Circumference were discussed in detail.  2) BP/HTN: Controlled. Continue current medications. 3) Lipids/HPL:  continue statins. 4)  Weight/Diet:  exercise, and carbohydrates information provided.  5) Chronic Care/Health Maintenance:  -Patient is on  Statin medications and encouraged to continue to follow  up with Ophthalmology, Podiatrist at least yearly or according to recommendations, and advised to quit smoking. I have recommended yearly flu vaccine and pneumonia vaccination at least every 5 years; moderate intensity exercise for up to 150 minutes weekly; and  sleep for at least 7 hours a day.  - 25 minutes of time was spent on the care of this patient , 50% of which was applied for counseling on diabetes complications and their preventions.  - I advised patient to maintain close follow up with Robert Bellow, MD for primary care needs.  Patient is asked  to bring meter and  blood glucose logs during their next visit.   Follow up plan: -Return in about 3 months (around 08/14/2015) for diabetes, high blood pressure, high cholesterol, follow up with pre-visit labs, meter, and logs.  Glade Lloyd, MD Phone: [phone]  Fax: [phone]   05/17/2015, 7:10 PM

## 2015-05-31 ENCOUNTER — Encounter (INDEPENDENT_AMBULATORY_CARE_PROVIDER_SITE_OTHER): Payer: Medicaid Other | Admitting: Ophthalmology

## 2015-05-31 DIAGNOSIS — I10 Essential (primary) hypertension: Secondary | ICD-10-CM | POA: Diagnosis not present

## 2015-05-31 DIAGNOSIS — E103513 Type 1 diabetes mellitus with proliferative diabetic retinopathy with macular edema, bilateral: Secondary | ICD-10-CM | POA: Diagnosis not present

## 2015-05-31 DIAGNOSIS — H43813 Vitreous degeneration, bilateral: Secondary | ICD-10-CM

## 2015-05-31 DIAGNOSIS — H35033 Hypertensive retinopathy, bilateral: Secondary | ICD-10-CM

## 2015-06-12 ENCOUNTER — Encounter (INDEPENDENT_AMBULATORY_CARE_PROVIDER_SITE_OTHER): Payer: Medicaid Other | Admitting: Ophthalmology

## 2015-06-21 ENCOUNTER — Encounter (INDEPENDENT_AMBULATORY_CARE_PROVIDER_SITE_OTHER): Payer: Medicaid Other | Admitting: Ophthalmology

## 2015-06-21 DIAGNOSIS — H35033 Hypertensive retinopathy, bilateral: Secondary | ICD-10-CM

## 2015-06-21 DIAGNOSIS — E103591 Type 1 diabetes mellitus with proliferative diabetic retinopathy without macular edema, right eye: Secondary | ICD-10-CM | POA: Diagnosis not present

## 2015-06-21 DIAGNOSIS — E10311 Type 1 diabetes mellitus with unspecified diabetic retinopathy with macular edema: Secondary | ICD-10-CM

## 2015-06-21 DIAGNOSIS — H43813 Vitreous degeneration, bilateral: Secondary | ICD-10-CM

## 2015-06-21 DIAGNOSIS — E103512 Type 1 diabetes mellitus with proliferative diabetic retinopathy with macular edema, left eye: Secondary | ICD-10-CM

## 2015-06-21 DIAGNOSIS — I10 Essential (primary) hypertension: Secondary | ICD-10-CM

## 2015-07-09 ENCOUNTER — Other Ambulatory Visit: Payer: Self-pay

## 2015-07-09 MED ORDER — GLUCOSE BLOOD VI STRP: ORAL_STRIP | Status: DC

## 2015-07-13 ENCOUNTER — Encounter (INDEPENDENT_AMBULATORY_CARE_PROVIDER_SITE_OTHER): Payer: Medicaid Other | Admitting: Ophthalmology

## 2015-07-13 DIAGNOSIS — I10 Essential (primary) hypertension: Secondary | ICD-10-CM

## 2015-07-13 DIAGNOSIS — H35033 Hypertensive retinopathy, bilateral: Secondary | ICD-10-CM

## 2015-07-13 DIAGNOSIS — E103592 Type 1 diabetes mellitus with proliferative diabetic retinopathy without macular edema, left eye: Secondary | ICD-10-CM | POA: Diagnosis not present

## 2015-07-13 DIAGNOSIS — H43813 Vitreous degeneration, bilateral: Secondary | ICD-10-CM

## 2015-07-13 DIAGNOSIS — E10311 Type 1 diabetes mellitus with unspecified diabetic retinopathy with macular edema: Secondary | ICD-10-CM

## 2015-07-13 DIAGNOSIS — E103511 Type 1 diabetes mellitus with proliferative diabetic retinopathy with macular edema, right eye: Secondary | ICD-10-CM | POA: Diagnosis not present

## 2015-07-16 ENCOUNTER — Other Ambulatory Visit: Payer: Self-pay

## 2015-07-16 MED ORDER — BLOOD GLUCOSE MONITOR KIT: PACK | Status: AC

## 2015-07-18 ENCOUNTER — Encounter (INDEPENDENT_AMBULATORY_CARE_PROVIDER_SITE_OTHER): Payer: Medicaid Other | Admitting: Ophthalmology

## 2015-07-18 DIAGNOSIS — H35033 Hypertensive retinopathy, bilateral: Secondary | ICD-10-CM

## 2015-07-18 DIAGNOSIS — H43813 Vitreous degeneration, bilateral: Secondary | ICD-10-CM | POA: Diagnosis not present

## 2015-07-18 DIAGNOSIS — I10 Essential (primary) hypertension: Secondary | ICD-10-CM

## 2015-07-18 DIAGNOSIS — E103513 Type 1 diabetes mellitus with proliferative diabetic retinopathy with macular edema, bilateral: Secondary | ICD-10-CM | POA: Diagnosis not present

## 2015-07-18 DIAGNOSIS — E10311 Type 1 diabetes mellitus with unspecified diabetic retinopathy with macular edema: Secondary | ICD-10-CM

## 2015-08-03 ENCOUNTER — Encounter (INDEPENDENT_AMBULATORY_CARE_PROVIDER_SITE_OTHER): Payer: Medicaid Other | Admitting: Ophthalmology

## 2015-08-07 ENCOUNTER — Other Ambulatory Visit: Payer: Self-pay | Admitting: "Endocrinology

## 2015-08-08 LAB — BASIC METABOLIC PANEL
BUN / CREAT RATIO: 31 — AB (ref 9–23)
BUN: 18 mg/dL (ref 6–24)
CHLORIDE: 95 mmol/L — AB (ref 96–106)
CO2: 23 mmol/L (ref 18–29)
Calcium: 9.6 mg/dL (ref 8.7–10.2)
Creatinine, Ser: 0.59 mg/dL (ref 0.57–1.00)
GFR calc Af Amer: 130 mL/min/{1.73_m2} (ref >59)
GFR calc non Af Amer: 113 mL/min/{1.73_m2} (ref >59)
Glucose: 191 mg/dL — ABNORMAL HIGH (ref 65–99)
POTASSIUM: 4.9 mmol/L (ref 3.5–5.2)
SODIUM: 136 mmol/L (ref 134–144)

## 2015-08-08 LAB — HGB A1C W/O EAG: HEMOGLOBIN A1C: 7.6 % — AB (ref 4.8–5.6)

## 2015-08-20 ENCOUNTER — Encounter: Payer: Self-pay | Admitting: "Endocrinology

## 2015-08-20 ENCOUNTER — Ambulatory Visit (INDEPENDENT_AMBULATORY_CARE_PROVIDER_SITE_OTHER): Payer: Medicaid Other | Admitting: "Endocrinology

## 2015-08-20 VITALS — BP 114/80 | HR 72 | Resp 18 | Ht 60.0 in | Wt 143.0 lb

## 2015-08-20 DIAGNOSIS — E11311 Type 2 diabetes mellitus with unspecified diabetic retinopathy with macular edema: Secondary | ICD-10-CM | POA: Diagnosis not present

## 2015-08-20 DIAGNOSIS — Z794 Long term (current) use of insulin: Secondary | ICD-10-CM | POA: Diagnosis not present

## 2015-08-20 DIAGNOSIS — E785 Hyperlipidemia, unspecified: Secondary | ICD-10-CM

## 2015-08-20 DIAGNOSIS — I10 Essential (primary) hypertension: Secondary | ICD-10-CM | POA: Diagnosis not present

## 2015-08-20 MED ORDER — INSULIN PEN NEEDLE 31G X 8 MM MISC: 1.0000 | Status: DC

## 2015-08-20 MED ORDER — INSULIN GLARGINE 100 UNIT/ML SOLOSTAR PEN
20.0000 [IU] | PEN_INJECTOR | Freq: Every day | SUBCUTANEOUS | Status: DC
Start: 2015-08-20 — End: 2015-08-20

## 2015-08-20 MED ORDER — INSULIN GLARGINE 100 UNIT/ML SOLOSTAR PEN: 20.0000 [IU] | PEN_INJECTOR | Freq: Every day | SUBCUTANEOUS | Status: DC

## 2015-08-20 MED ORDER — CANAGLIFLOZIN 300 MG PO TABS: 300.0000 mg | ORAL_TABLET | Freq: Every day | ORAL | Status: DC

## 2015-08-20 NOTE — Patient Instructions (Signed)

## 2015-08-20 NOTE — Progress Notes (Signed)
Subjective:    Patient ID: Allison [name], female    DOB: 1971-04-09, PCP Robert Bellow, MD   Past Medical History  Diagnosis Date  . Insulin dependent diabetes mellitus ([place])   . Hypertension   . Gastroparesis     possible  . Neuropathy ([place])   . Retinopathy   . Dyslipidemia   . Glaucoma   . 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 Cancer [facility])     cervical    Past Surgical History  Procedure Laterality Date  . Abdominal hysterectomy      partial   . Eye surgery      multiple   Social History   Social History  . Marital Status: Divorced    Spouse Name: N/A  . Number of Children: N/A  . Years of Education: N/A   Social History Main Topics  . Smoking status: Former Smoker -- 0.00 packs/day for 26 years    Types: Cigarettes    Quit date: 05/05/2014  . Smokeless tobacco: Never Used  . Alcohol Use: No  . Drug Use: No  . Sexual Activity: Not Currently    Birth Control/ Protection: Surgical   Other Topics Concern  . Not on file   Social History Narrative   Outpatient Encounter Prescriptions as of 08/20/2015  Medication Sig  . albuterol (PROAIR HFA) 108 (90 BASE) MCG/ACT inhaler Inhale 2 puffs into the lungs every 4 (four) hours as needed for wheezing or shortness of breath.  . ALPRAZolam (XANAX) 1 MG tablet Take 1 mg by mouth 3 (three) times daily as needed for anxiety.   [name] aspirin EC 81 MG tablet Take 81 mg by mouth daily.  . blood glucose meter kit and supplies KIT Dispense based on patient and insurance preference. Use up to four times daily as directed. (FOR ICD 10 E11.65).  . canagliflozin (INVOKANA) 300 MG TABS tablet Take 1 tablet (300 mg total) by mouth daily.  [name] estradiol (VIVELLE-DOT) 0.1 MG/24HR patch Place 1 patch (0.1 mg total) onto the skin 2 (two) times a week.  . fluconazole (DIFLUCAN) 150 MG tablet TAKE 1 TABLET BY MOUTH ONE TIME  . furosemide (LASIX) 20 MG tablet Take 20 mg by mouth 2 (two) times daily as needed.   [name] glucose blood  (ACCU-CHEK AVIVA) test strip Use as instructed tid  . HYDROcodone-acetaminophen (NORCO) 10-325 MG per tablet Take 1 tablet by mouth every 4 (four) hours as needed for severe pain.  [name] loratadine (CLARITIN) 10 MG tablet Take 10 mg by mouth daily.  . Multiple Vitamin (MULTIVITAMIN) tablet Take 1 tablet by mouth daily.  [name] omeprazole (PRILOSEC) 20 MG capsule Take 20 mg by mouth 2 (two) times daily before a meal.   . polyethylene glycol (MIRALAX / GLYCOLAX) packet Take 17 g by mouth daily.  . potassium chloride SA (K-DUR,KLOR-CON) 20 MEQ tablet Take 20 mEq by mouth 2 (two) times daily.   . [DISCONTINUED] canagliflozin (INVOKANA) 300 MG TABS tablet Take 300 mg by mouth daily.  . [DISCONTINUED] insulin glargine (LANTUS) 100 UNIT/ML injection Inject 20 Units into the skin at bedtime.  . Insulin Glargine (LANTUS SOLOSTAR) 100 UNIT/ML Solostar Pen Inject 20 Units into the skin daily at 10 pm.  . Insulin Pen Needle (B-D ULTRAFINE III SHORT PEN) 31G X 8 MM MISC 1 each by Does not apply route as directed.  . [DISCONTINUED] Insulin Glargine (LANTUS SOLOSTAR) 100 UNIT/ML Solostar Pen Inject 20 Units into the skin daily  at 10 pm.  . [DISCONTINUED] Insulin Pen Needle (B-D ULTRAFINE III SHORT PEN) 31G X 8 MM MISC 1 each by Does not apply route as directed.   No facility-administered encounter medications on file as of 08/20/2015.   ALLERGIES: Allergies  Allergen Reactions  . Beta Adrenergic Blockers     Heart palpitations  . Codeine Other (See Comments)    TYLENOL #3  Welts and itching  . Cymbalta [Duloxetine Hcl]     SOB  . Doxycycline Other (See Comments)    Heart problems  . Lexapro [Escitalopram Oxalate]     Heart palpitations and hives  . Other Palpitations    Allergic to all beta blocker eye drops!!  . Oxycodone Itching   VACCINATION STATUS:  There is no immunization history on file for this patient.  Diabetes She presents for her follow-up diabetic visit. She has type 2 diabetes mellitus.  Onset time: She was diagnosed at approximate age of 7 years. Her disease course has been stable. There are no hypoglycemic associated symptoms. Pertinent negatives for hypoglycemia include no confusion, headaches, pallor or seizures. There are no diabetic associated symptoms. Pertinent negatives for diabetes include no chest pain, no polydipsia, no polyphagia and no polyuria. There are no hypoglycemic complications. Symptoms are stable. Diabetic complications include retinopathy. Risk factors for coronary artery disease include diabetes mellitus, dyslipidemia and hypertension. Current diabetic treatment includes insulin injections and oral agent (monotherapy). She is compliant with treatment most of the time. Her weight is decreasing steadily. She is following a generally unhealthy diet. When asked about meal planning, she reported none. She never participates in exercise. There is no change in her home blood glucose trend. Her overall blood glucose range is 140-180 mg/dl. Eye exam is current.  Hyperlipidemia This is a chronic problem. The current episode started more than 1 year ago. Exacerbating diseases include diabetes. Pertinent negatives include no chest pain, myalgias or shortness of breath. Risk factors for coronary artery disease include diabetes mellitus, dyslipidemia, hypertension and a sedentary lifestyle.  Hypertension This is a chronic problem. The current episode started more than 1 year ago. Pertinent negatives include no chest pain, headaches, palpitations or shortness of breath. Risk factors for coronary artery disease include dyslipidemia, diabetes mellitus, obesity and sedentary lifestyle. Hypertensive end-organ damage includes retinopathy.     Review of Systems  Constitutional: Negative for fever, chills and unexpected weight change.  HENT: Negative for trouble swallowing and voice change.   Eyes: Negative for visual disturbance.  Respiratory: Negative for cough, shortness of breath  and wheezing.   Cardiovascular: Negative for chest pain, palpitations and leg swelling.  Gastrointestinal: Negative for nausea, vomiting and diarrhea.  Endocrine: Negative for cold intolerance, heat intolerance, polydipsia, polyphagia and polyuria.  Musculoskeletal: Negative for myalgias and arthralgias.  Skin: Negative for color change, pallor, rash and wound.  Neurological: Negative for seizures and headaches.  Psychiatric/Behavioral: Negative for suicidal ideas and confusion.    Objective:    BP 114/80 mmHg  Pulse 72  Resp 18  Ht 5' (1.524 m)  Wt 143 lb (64.864 kg)  BMI 27.93 kg/m2  SpO2 99%  Wt Readings from Last 3 Encounters:  08/20/15 143 lb (64.864 kg)  05/16/15 143 lb (64.864 kg)  10/13/14 158 lb (71.668 kg)    Physical Exam  Constitutional: She is oriented to person, place, and time. She appears well-developed.  HENT:  Head: Normocephalic and atraumatic.  Eyes: EOM are normal.  Neck: Normal range of motion. Neck supple. No tracheal deviation  present. No thyromegaly present.  Cardiovascular: Normal rate and regular rhythm.   Pulmonary/Chest: Effort normal and breath sounds normal.  Abdominal: Soft. Bowel sounds are normal. There is no tenderness. There is no guarding.  Musculoskeletal: Normal range of motion. She exhibits no edema.  Neurological: She is alert and oriented to person, place, and time. She has normal reflexes. No cranial nerve deficit. Coordination normal.  Skin: Skin is warm and dry. No rash noted. No erythema. No pallor.  Psychiatric: She has a normal mood and affect. Judgment normal.    Results for orders placed or performed in visit on 24/26/83  Basic metabolic panel  Result Value Ref Range   Glucose 191 (H) 65 - 99 mg/dL   BUN 18 6 - 24 mg/dL   Creatinine, Ser 0.59 0.57 - 1.00 mg/dL   GFR calc non Af Amer 113 >59 mL/min/1.73   GFR calc Af Amer 130 >59 mL/min/1.73   BUN/Creatinine Ratio 31 (H) 9 - 23   Sodium 136 134 - 144 mmol/L   Potassium  4.9 3.5 - 5.2 mmol/L   Chloride 95 (L) 96 - 106 mmol/L   CO2 23 18 - 29 mmol/L   Calcium 9.6 8.7 - 10.2 mg/dL  Hgb A1c w/o eAG  Result Value Ref Range   Hgb A1c MFr Bld 7.6 (H) 4.8 - 5.6 %   Diabetic Labs (most recent): Lab Results  Component Value Date   HGBA1C 7.6* 08/07/2015   HGBA1C 7.1* 05/14/2015   HGBA1C 7.6 02/07/2015   HGBA1C 7.6 02/07/2015     Assessment & Plan:   1. 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 These are all discussed in detail with the patient.  Patient came with controlled fasting glucose profile, and  recent A1c of 7.6% overall improving from 14.7 %.   Recent labs reviewed.   - I have re-counseled the patient on diet management  by adopting a carbohydrate restricted / protein rich  Diet.  - Suggestion is made for patient to avoid simple carbohydrates   from their diet including Cakes , Desserts, Ice Cream,  Soda (  diet and regular) , Sweet Tea , Candies,  Chips, Cookies, Artificial Sweeteners,   and "Sugar-free" Products .  This will help patient to have stable blood glucose profile and potentially avoid unintended  Weight gain.  - Patient is advised to stick to a routine mealtimes to eat 3 meals  a day and avoid unnecessary snacks ( to snack only to correct hypoglycemia).  - The patient  has been  scheduled with Jearld Fenton, RDN, CDE for individualized DM education.  - I have approached patient with the following individualized plan to manage diabetes and patient agrees.  Her fasting BG readings are all on target, EAG 154.  -Her a1c is better at  7.6%,  overall improved from 14.7%. - I will continue basal insulin Lantus 20 units  qhs.  -She will need strict therapy to avoid acute and chronic complications of diabetes.  -she will not need prandial insulin for now.  -continue Invokana 350m po qday. SE discussed with patient. She does not tolerate metformin.  -She is not a candidate forSGLT2 inhibitors and Incretin therapy. - Patient specific target  for A1c; LDL, HDL, Triglycerides, and  Waist Circumference were discussed in detail.  2) BP/HTN: Controlled. Continue current medications. 3) Lipids/HPL:  continue statins. 4)  Weight/Diet:  exercise, and carbohydrates information provided.  5) Chronic Care/Health Maintenance:  -Patient is on  Statin medications and encouraged to continue to follow up with Ophthalmology, Podiatrist at least yearly or according to recommendations, and advised to quit smoking. I have recommended yearly flu vaccine and pneumonia vaccination at least every 5 years; moderate intensity exercise for up to 150 minutes weekly; and  sleep for at least 7 hours a day.  - 25 minutes of time was spent on the care of this patient , 50% of which was applied for counseling on diabetes complications and their preventions.  - I advised patient to maintain close follow up with Robert Bellow, MD for primary care needs.  Patient is asked to bring meter and  blood glucose logs during their next visit.   Follow up plan: -Return in about 3 months (around 11/20/2015) for diabetes, high blood pressure, high cholesterol, follow up with pre-visit labs, meter, and logs.  Glade Lloyd, MD Phone: [phone]  Fax: [phone]   08/20/2015, 9:48 AM

## 2015-08-24 ENCOUNTER — Encounter (INDEPENDENT_AMBULATORY_CARE_PROVIDER_SITE_OTHER): Payer: Medicaid Other | Admitting: Ophthalmology

## 2015-08-28 ENCOUNTER — Encounter (INDEPENDENT_AMBULATORY_CARE_PROVIDER_SITE_OTHER): Payer: Medicaid Other | Admitting: Ophthalmology

## 2015-08-28 DIAGNOSIS — H43813 Vitreous degeneration, bilateral: Secondary | ICD-10-CM | POA: Diagnosis not present

## 2015-08-28 DIAGNOSIS — H35033 Hypertensive retinopathy, bilateral: Secondary | ICD-10-CM

## 2015-08-28 DIAGNOSIS — I10 Essential (primary) hypertension: Secondary | ICD-10-CM

## 2015-08-28 DIAGNOSIS — E10311 Type 1 diabetes mellitus with unspecified diabetic retinopathy with macular edema: Secondary | ICD-10-CM

## 2015-08-28 DIAGNOSIS — E103513 Type 1 diabetes mellitus with proliferative diabetic retinopathy with macular edema, bilateral: Secondary | ICD-10-CM

## 2015-09-16 ENCOUNTER — Other Ambulatory Visit: Payer: Self-pay | Admitting: "Endocrinology

## 2015-09-26 ENCOUNTER — Encounter (INDEPENDENT_AMBULATORY_CARE_PROVIDER_SITE_OTHER): Payer: Medicaid Other | Admitting: Ophthalmology

## 2015-09-26 DIAGNOSIS — E103513 Type 1 diabetes mellitus with proliferative diabetic retinopathy with macular edema, bilateral: Secondary | ICD-10-CM | POA: Diagnosis not present

## 2015-09-26 DIAGNOSIS — E10311 Type 1 diabetes mellitus with unspecified diabetic retinopathy with macular edema: Secondary | ICD-10-CM

## 2015-09-26 DIAGNOSIS — I10 Essential (primary) hypertension: Secondary | ICD-10-CM | POA: Diagnosis not present

## 2015-09-26 DIAGNOSIS — H43813 Vitreous degeneration, bilateral: Secondary | ICD-10-CM

## 2015-09-26 DIAGNOSIS — H35033 Hypertensive retinopathy, bilateral: Secondary | ICD-10-CM

## 2015-10-02 ENCOUNTER — Other Ambulatory Visit: Payer: Self-pay | Admitting: "Endocrinology

## 2015-10-10 ENCOUNTER — Encounter (INDEPENDENT_AMBULATORY_CARE_PROVIDER_SITE_OTHER): Payer: Medicaid Other | Admitting: Ophthalmology

## 2015-11-05 ENCOUNTER — Encounter (INDEPENDENT_AMBULATORY_CARE_PROVIDER_SITE_OTHER): Payer: Medicaid Other | Admitting: Ophthalmology

## 2015-11-05 DIAGNOSIS — I10 Essential (primary) hypertension: Secondary | ICD-10-CM | POA: Diagnosis not present

## 2015-11-05 DIAGNOSIS — H35033 Hypertensive retinopathy, bilateral: Secondary | ICD-10-CM | POA: Diagnosis not present

## 2015-11-05 DIAGNOSIS — E10311 Type 1 diabetes mellitus with unspecified diabetic retinopathy with macular edema: Secondary | ICD-10-CM

## 2015-11-05 DIAGNOSIS — H43813 Vitreous degeneration, bilateral: Secondary | ICD-10-CM

## 2015-11-09 ENCOUNTER — Other Ambulatory Visit: Payer: Self-pay

## 2015-11-09 MED ORDER — GLUCOSE BLOOD VI STRP: ORAL_STRIP | Status: DC

## 2015-11-13 ENCOUNTER — Encounter: Payer: Self-pay | Admitting: Orthopaedic Surgery

## 2015-11-13 ENCOUNTER — Ambulatory Visit (INDEPENDENT_AMBULATORY_CARE_PROVIDER_SITE_OTHER): Payer: Medicaid Other | Admitting: Orthopaedic Surgery

## 2015-11-13 ENCOUNTER — Other Ambulatory Visit: Payer: Self-pay | Admitting: "Endocrinology

## 2015-11-13 ENCOUNTER — Ambulatory Visit (INDEPENDENT_AMBULATORY_CARE_PROVIDER_SITE_OTHER): Payer: Medicaid Other

## 2015-11-13 VITALS — BP 114/77 | HR 72 | Ht 62.5 in | Wt 144.0 lb

## 2015-11-13 DIAGNOSIS — Z794 Long term (current) use of insulin: Secondary | ICD-10-CM | POA: Diagnosis not present

## 2015-11-13 DIAGNOSIS — I10 Essential (primary) hypertension: Secondary | ICD-10-CM

## 2015-11-13 DIAGNOSIS — E11311 Type 2 diabetes mellitus with unspecified diabetic retinopathy with macular edema: Secondary | ICD-10-CM | POA: Diagnosis not present

## 2015-11-13 DIAGNOSIS — M25512 Pain in left shoulder: Secondary | ICD-10-CM

## 2015-11-13 NOTE — Progress Notes (Signed)
Subjective:  My left shoulder hurts    Patient ID: Allison [name], female    DOB: 01/03/1971, 45 y.o.   MRN: [id number]  Shoulder Pain  The pain is present in the left shoulder. This is a chronic problem. The current episode started more than 1 year ago. The problem occurs daily. The problem has been gradually worsening. The quality of the pain is described as aching. The pain is at a severity of 5/10. The pain is moderate. The symptoms are aggravated by activity and cold. She has tried NSAIDS, rest, oral narcotics, cold, heat and movement for the symptoms. The treatment provided mild relief.   She has had left shoulder pain for about a year.  She was followed by Dr. Everette Rank until his retirement.  She has seen Dr. Karie Kirks now, most recently 10-30-15.  I have copies of his notes.  She has no trauma.  She has no paresthesias. She is not getting any better.  Overhead use is painful. She is concerned she may have a rotator cuff tear.  She has done exercises at home with little help.  She has significant diabetes mellitus with significant diabetic eye problems. She has been on Insulin and her last A1C is 7.3.  She has neuropathy also from the diabetes.  She has shortness of breath from Asthma.   Review of Systems  HENT: Negative for congestion.   Eyes: Positive for visual disturbance.  Respiratory: Positive for cough and shortness of breath.   Cardiovascular: Negative for chest pain and leg swelling.  Endocrine: Positive for cold intolerance.  Musculoskeletal: Positive for joint swelling and arthralgias.  Allergic/Immunologic: Positive for environmental allergies.   Past Medical History  Diagnosis Date  . Insulin dependent diabetes mellitus ([place])   . Hypertension   . Gastroparesis     possible  . Neuropathy ([place])   . Retinopathy   . Dyslipidemia   . Glaucoma   . 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 Cancer [facility])     cervical     Past Surgical History  Procedure Laterality  Date  . Abdominal hysterectomy      partial   . Eye surgery      multiple    Current Outpatient Prescriptions on File Prior to Visit  Medication Sig Dispense Refill  . albuterol (PROAIR HFA) 108 (90 BASE) MCG/ACT inhaler Inhale 2 puffs into the lungs every 4 (four) hours as needed for wheezing or shortness of breath.    . ALPRAZolam (XANAX) 1 MG tablet Take 1 mg by mouth 3 (three) times daily as needed for anxiety.     [name] aspirin EC 81 MG tablet Take 81 mg by mouth daily.    . blood glucose meter kit and supplies KIT Dispense based on patient and insurance preference. Use up to four times daily as directed. (FOR ICD 10 E11.65). 1 each 0  . furosemide (LASIX) 20 MG tablet Take 20 mg by mouth 2 (two) times daily as needed.     [name] glucose blood (ACCU-CHEK AVIVA PLUS) test strip USE AS DIRECTED UP TO FOUR TIMES DAILY. 150 each 5  . HYDROcodone-acetaminophen (NORCO) 10-325 MG per tablet Take 1 tablet by mouth every 4 (four) hours as needed for severe pain.    . Insulin Glargine (LANTUS SOLOSTAR) 100 UNIT/ML Solostar Pen Inject 20 Units into the skin daily at 10 pm. 5 pen 2  . Insulin Pen Needle (B-D ULTRAFINE III SHORT PEN) 31G X 8 MM  MISC 1 each by Does not apply route as directed. 100 each 3  . INVOKANA 300 MG TABS tablet TAKE 1 TABLET BY MOUTH DAILY 30 tablet 2  . loratadine (CLARITIN) 10 MG tablet Take 10 mg by mouth daily.    . Multiple Vitamin (MULTIVITAMIN) tablet Take 1 tablet by mouth daily.    . polyethylene glycol (MIRALAX / GLYCOLAX) packet Take 17 g by mouth daily.    . potassium chloride SA (K-DUR,KLOR-CON) 20 MEQ tablet Take 20 mEq by mouth 2 (two) times daily.     [name] omeprazole (PRILOSEC) 20 MG capsule Take 20 mg by mouth 2 (two) times daily before a meal.      No current facility-administered medications on file prior to visit.    Social History   Social History  . Marital Status: Divorced    Spouse Name: N/A  . Number of Children: N/A  . Years of Education: N/A    Occupational History  . Not on file.   Social History Main Topics  . Smoking status: Former Smoker -- 0.00 packs/day for 26 years    Types: Cigarettes    Quit date: 05/05/2014  . Smokeless tobacco: Never Used  . Alcohol Use: No  . Drug Use: No  . Sexual Activity: Not Currently    Birth Control/ Protection: Surgical   Other Topics Concern  . Not on file   Social History Narrative    BP 114/77 mmHg  Pulse 72  Ht 5' 2.5" (1.588 m)  Wt 144 lb (65.318 kg)  BMI 25.90 kg/m2      Objective:   Physical Exam  Constitutional: She is oriented to person, place, and time. She appears well-developed and well-nourished.  HENT:  Head: Normocephalic and atraumatic.  Eyes: Conjunctivae and EOM are normal. Pupils are equal, round, and reactive to light.  Neck: Normal range of motion. Neck supple.  Cardiovascular: Normal rate, regular rhythm and intact distal pulses.   Pulmonary/Chest: Effort normal.  Abdominal: Soft.  Musculoskeletal: She exhibits tenderness (Left shoulder tender, forward flexion 160, abduction 130, internal 20, external 30, extension 10, adduction full.  NV intact.  Right shoudler negative.).  Neurological: She is alert and oriented to person, place, and time. She displays normal reflexes. No cranial nerve deficit. She exhibits normal muscle tone. Coordination normal.  Skin: Skin is warm and dry.  Psychiatric: She has a normal mood and affect. Her behavior is normal. Judgment and thought content normal.    X-rays were done of the left shoulder, see separate report.      Assessment & Plan:   Encounter Diagnoses  Name Primary?  . Left shoulder pain Yes  . Essential hypertension   . 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  The left shoulder was prepped  appropriately after time out was performed.   Sterile technique was observed and injection of 1 cc of Depo-Medrol 40 mg with several cc's of plain xylocaine. Anesthesia was provided by ethyl chloride and a 20-gauge needle was used to inject the shoulder area. A posterior approach was used.  The injection was tolerated well.  A band aid dressing was applied.  The patient was advised to apply ice later today and tomorrow to the injection sight as needed.  I will see her in two weeks.  If not improved, consider MRI.  Continue present pain medicine.  Call if any problem  Precautions given.  Electronically Signed Sanjuana Kava, MD 6/20/20171:34 PM

## 2015-11-14 ENCOUNTER — Telehealth: Payer: Self-pay | Admitting: Orthopaedic Surgery

## 2015-11-14 MED ORDER — DICLOFENAC SODIUM 75 MG PO TBEC: 75.0000 mg | DELAYED_RELEASE_TABLET | Freq: Two times a day (BID) | ORAL | Status: DC

## 2015-11-14 NOTE — Telephone Encounter (Signed)
Rx done. 

## 2015-11-14 NOTE — Telephone Encounter (Signed)
Patient called with question - states thought that medication was to be prescribed at time of office visit yesterday, 11/13/15, ?anti-inflammatory?  States if so, it is to be ordered at [facility] in [place], [place] Dr.  Quintella Baton she has called and told they do not have a prescription for her.  Please advise.  Patient [phone].

## 2015-11-15 NOTE — Telephone Encounter (Signed)
Called pharmacy and they the Rx it just needs prior authorization, Advised we would get that filled out and sent back in. I also called patient to let her know they had it but it needed a form filled out for Medicaid to pay for it.

## 2015-11-21 LAB — T4+FREE T4
FREE T4 BY DIALYSIS: 0.77 ng/dL
Thyroxine (T4): 7 ug/dL

## 2015-11-21 LAB — LIPID PANEL W/O CHOL/HDL RATIO
CHOLESTEROL TOTAL: 143 mg/dL (ref 100–199)
HDL: 51 mg/dL (ref >39)
LDL Calculated: 76 mg/dL (ref 0–99)
Triglycerides: 79 mg/dL (ref 0–149)
VLDL CHOLESTEROL CAL: 16 mg/dL (ref 5–40)

## 2015-11-21 LAB — TSH: TSH: 0.547 u[IU]/mL (ref 0.450–4.500)

## 2015-11-21 LAB — BASIC METABOLIC PANEL
BUN / CREAT RATIO: 34 — AB (ref 9–23)
BUN: 20 mg/dL (ref 6–24)
CHLORIDE: 101 mmol/L (ref 96–106)
CO2: 24 mmol/L (ref 18–29)
Calcium: 9.6 mg/dL (ref 8.7–10.2)
Creatinine, Ser: 0.59 mg/dL (ref 0.57–1.00)
GFR calc Af Amer: 130 mL/min/{1.73_m2} (ref >59)
GFR calc non Af Amer: 113 mL/min/{1.73_m2} (ref >59)
GLUCOSE: 98 mg/dL (ref 65–99)
POTASSIUM: 4.8 mmol/L (ref 3.5–5.2)
SODIUM: 141 mmol/L (ref 134–144)

## 2015-11-21 LAB — MICROALBUMIN / CREATININE URINE RATIO

## 2015-11-21 LAB — HEMOGLOBIN: HEMOGLOBIN: 14.2 g/dL (ref 11.1–15.9)

## 2015-11-22 ENCOUNTER — Ambulatory Visit (INDEPENDENT_AMBULATORY_CARE_PROVIDER_SITE_OTHER): Payer: Medicaid Other | Admitting: "Endocrinology

## 2015-11-22 ENCOUNTER — Encounter: Payer: Self-pay | Admitting: "Endocrinology

## 2015-11-22 VITALS — BP 121/80 | HR 70 | Ht 62.5 in | Wt 143.0 lb

## 2015-11-22 DIAGNOSIS — E785 Hyperlipidemia, unspecified: Secondary | ICD-10-CM

## 2015-11-22 DIAGNOSIS — Z794 Long term (current) use of insulin: Secondary | ICD-10-CM

## 2015-11-22 DIAGNOSIS — E11311 Type 2 diabetes mellitus with unspecified diabetic retinopathy with macular edema: Secondary | ICD-10-CM

## 2015-11-22 DIAGNOSIS — I10 Essential (primary) hypertension: Secondary | ICD-10-CM | POA: Diagnosis not present

## 2015-11-22 MED ORDER — CANAGLIFLOZIN 300 MG PO TABS: 300.0000 mg | ORAL_TABLET | Freq: Every day | ORAL | Status: DC

## 2015-11-22 NOTE — Progress Notes (Signed)
Subjective:    Patient ID: Allison [name], female    DOB: [birth year], PCP Robert Bellow, MD   Past Medical History  Diagnosis Date  . Insulin dependent diabetes mellitus ([place])   . Hypertension   . Gastroparesis     possible  . Neuropathy ([place])   . Retinopathy   . Dyslipidemia   . Glaucoma   . 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 Cancer [facility])     cervical    Past Surgical History  Procedure Laterality Date  . Abdominal hysterectomy      partial   . Eye surgery      multiple   Social History   Social History  . Marital Status: Divorced    Spouse Name: N/A  . Number of Children: N/A  . Years of Education: N/A   Social History Main Topics  . Smoking status: Former Smoker -- 0.00 packs/day for 26 years    Types: Cigarettes    Quit date: 05/05/2014  . Smokeless tobacco: Never Used  . Alcohol Use: No  . Drug Use: No  . Sexual Activity: Not Currently    Birth Control/ Protection: Surgical   Other Topics Concern  . None   Social History Narrative   Outpatient Encounter Prescriptions as of 11/22/2015  Medication Sig  . Insulin Glargine (LANTUS SOLOSTAR [place]) Inject 20 Units into the skin at bedtime.  [name] albuterol (PROAIR HFA) 108 (90 BASE) MCG/ACT inhaler Inhale 2 puffs into the lungs every 4 (four) hours as needed for wheezing or shortness of breath.  . ALPRAZolam (XANAX) 1 MG tablet Take 1 mg by mouth 3 (three) times daily as needed for anxiety.   [name] aspirin EC 81 MG tablet Take 81 mg by mouth daily.  . blood glucose meter kit and supplies KIT Dispense based on patient and insurance preference. Use up to four times daily as directed. (FOR ICD 10 E11.65).  . canagliflozin (INVOKANA) 300 MG TABS tablet Take 1 tablet (300 mg total) by mouth daily.  . diclofenac (VOLTAREN) 75 MG EC tablet Take 1 tablet (75 mg total) by mouth 2 (two) times daily with a meal.  . furosemide (LASIX) 20 MG tablet Take 20 mg by mouth 2 (two) times daily as needed.   [name] glucose  blood (ACCU-CHEK AVIVA PLUS) test strip USE AS DIRECTED UP TO FOUR TIMES DAILY.  [name] HYDROcodone-acetaminophen (NORCO) 10-325 MG per tablet Take 1 tablet by mouth every 4 (four) hours as needed for severe pain.  . Insulin Pen Needle (B-D ULTRAFINE III SHORT PEN) 31G X 8 MM MISC 1 each by Does not apply route as directed.  . loratadine (CLARITIN) 10 MG tablet Take 10 mg by mouth daily.  . Multiple Vitamin (MULTIVITAMIN) tablet Take 1 tablet by mouth daily.  [name] omeprazole (PRILOSEC) 20 MG capsule Take 20 mg by mouth 2 (two) times daily before a meal.   . polyethylene glycol (MIRALAX / GLYCOLAX) packet Take 17 g by mouth daily.  . potassium chloride SA (K-DUR,KLOR-CON) 20 MEQ tablet Take 20 mEq by mouth 2 (two) times daily.   . [DISCONTINUED] Insulin Glargine (LANTUS SOLOSTAR) 100 UNIT/ML Solostar Pen Inject 20 Units into the skin daily at 10 pm.  . [DISCONTINUED] INVOKANA 300 MG TABS tablet TAKE 1 TABLET BY MOUTH DAILY   No facility-administered encounter medications on file as of 11/22/2015.   ALLERGIES: Allergies  Allergen Reactions  . Beta Adrenergic Blockers     Heart  palpitations  . Codeine Other (See Comments)    TYLENOL #3  Welts and itching  . Cymbalta [Duloxetine Hcl]     SOB  . Doxycycline Other (See Comments)    Heart problems  . Lexapro [Escitalopram Oxalate]     Heart palpitations and hives  . Other Palpitations    Allergic to all beta blocker eye drops!!  . Oxycodone Itching   VACCINATION STATUS:  There is no immunization history on file for this patient.  Diabetes She presents for her follow-up diabetic visit. She has type 2 diabetes mellitus. Onset time: She was diagnosed at approximate age of 5 years. Her disease course has been stable. There are no hypoglycemic associated symptoms. Pertinent negatives for hypoglycemia include no confusion, headaches, pallor or seizures. There are no diabetic associated symptoms. Pertinent negatives for diabetes include no chest pain,  no polydipsia, no polyphagia and no polyuria. There are no hypoglycemic complications. Symptoms are stable. Diabetic complications include retinopathy. Risk factors for coronary artery disease include diabetes mellitus, dyslipidemia and hypertension. Current diabetic treatment includes insulin injections and oral agent (monotherapy). She is compliant with treatment most of the time. Her weight is decreasing steadily. She is following a generally unhealthy diet. When asked about meal planning, she reported none. She never participates in exercise. There is no change in her home blood glucose trend. Her breakfast blood glucose range is generally 130-140 mg/dl. Her overall blood glucose range is 130-140 mg/dl. Eye exam is current.  Hyperlipidemia This is a chronic problem. The current episode started more than 1 year ago. Exacerbating diseases include diabetes. Pertinent negatives include no chest pain, myalgias or shortness of breath. Risk factors for coronary artery disease include diabetes mellitus, dyslipidemia, hypertension and a sedentary lifestyle.  Hypertension This is a chronic problem. The current episode started more than 1 year ago. Pertinent negatives include no chest pain, headaches, palpitations or shortness of breath. Risk factors for coronary artery disease include dyslipidemia, diabetes mellitus, obesity and sedentary lifestyle. Hypertensive end-organ damage includes retinopathy.     Review of Systems  Constitutional: Negative for fever, chills and unexpected weight change.  HENT: Negative for trouble swallowing and voice change.   Eyes: Negative for visual disturbance.  Respiratory: Negative for cough, shortness of breath and wheezing.   Cardiovascular: Negative for chest pain, palpitations and leg swelling.  Gastrointestinal: Negative for nausea, vomiting and diarrhea.  Endocrine: Negative for cold intolerance, heat intolerance, polydipsia, polyphagia and polyuria.  Musculoskeletal:  Negative for myalgias and arthralgias.  Skin: Negative for color change, pallor, rash and wound.  Neurological: Negative for seizures and headaches.  Psychiatric/Behavioral: Negative for suicidal ideas and confusion.    Objective:    BP 121/80 mmHg  Pulse 70  Ht 5' 2.5" (1.588 m)  Wt 143 lb (64.864 kg)  BMI 25.72 kg/m2  Wt Readings from Last 3 Encounters:  11/22/15 143 lb (64.864 kg)  11/13/15 144 lb (65.318 kg)  08/20/15 143 lb (64.864 kg)    Physical Exam  Constitutional: She is oriented to person, place, and time. She appears well-developed.  HENT:  Head: Normocephalic and atraumatic.  Eyes: EOM are normal.  Neck: Normal range of motion. Neck supple. No tracheal deviation present. No thyromegaly present.  Cardiovascular: Normal rate and regular rhythm.   Pulmonary/Chest: Effort normal and breath sounds normal.  Abdominal: Soft. Bowel sounds are normal. There is no tenderness. There is no guarding.  Musculoskeletal: Normal range of motion. She exhibits no edema.  Neurological: She is alert and oriented  to person, place, and time. She has normal reflexes. No cranial nerve deficit. Coordination normal.  Skin: Skin is warm and dry. No rash noted. No erythema. No pallor.  Psychiatric: She has a normal mood and affect. Judgment normal.    Results for orders placed or performed in visit on 41/28/78  Basic metabolic panel  Result Value Ref Range   Glucose 98 65 - 99 mg/dL   BUN 20 6 - 24 mg/dL   Creatinine, Ser 0.59 0.57 - 1.00 mg/dL   GFR calc non Af Amer 113 >59 mL/min/1.73   GFR calc Af Amer 130 >59 mL/min/1.73   BUN/Creatinine Ratio 34 (H) 9 - 23   Sodium 141 134 - 144 mmol/L   Potassium 4.8 3.5 - 5.2 mmol/L   Chloride 101 96 - 106 mmol/L   CO2 24 18 - 29 mmol/L   Calcium 9.6 8.7 - 10.2 mg/dL  Lipid Panel w/o Chol/HDL Ratio  Result Value Ref Range   Cholesterol, Total 143 100 - 199 mg/dL   Triglycerides 79 0 - 149 mg/dL   HDL 51 >39 mg/dL   VLDL Cholesterol Cal 16 5  - 40 mg/dL   LDL Calculated 76 0 - 99 mg/dL  T4+Free T4  Result Value Ref Range   Thyroxine (T4) 7.0 ug/dL   Free T4 by Dialysis 0.77 ng/dL  TSH  Result Value Ref Range   TSH 0.547 0.450 - 4.500 uIU/mL  Hemoglobin  Result Value Ref Range   Hemoglobin 14.2 11.1 - 15.9 g/dL  Microalbumin / creatinine urine ratio  Result Value Ref Range   Creatinine, Urine CANCELED mg/dL   Microalbum.,U,Random CANCELED    Diabetic Labs (most recent): Lab Results  Component Value Date   HGBA1C 7.6* 08/07/2015   HGBA1C 7.1* 05/14/2015   HGBA1C 7.6 02/07/2015   HGBA1C 7.6 02/07/2015     Assessment & Plan:   1. 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 These are all discussed in detail with the patient.  Patient came with controlled fasting glucose profile, and  recent A1c of 7.6% overall improving from 14.7 %.   Recent labs reviewed.   - I have re-counseled the patient on diet management  by adopting a carbohydrate restricted / protein rich  Diet.  - Suggestion is made for patient to avoid simple carbohydrates   from their diet including Cakes , Desserts, Ice Cream,  Soda (  diet and regular) , Sweet Tea , Candies,  Chips, Cookies, Artificial Sweeteners,   and "Sugar-free" Products .  This will help patient to have stable blood glucose profile and potentially avoid unintended  Weight gain.  - Patient is advised to stick to a routine mealtimes to eat 3 meals  a day and avoid unnecessary snacks ( to snack only to correct hypoglycemia).  - The patient  has been  scheduled with Jearld Fenton, RDN, CDE for individualized DM education.  - I have approached patient with the following  individualized plan to manage diabetes and patient agrees.  Her fasting BG readings are all on target, EAG 134.  -Her  Last a1c is better at  7.6%,  overall improved from 14.7%. - I will continue basal insulin Lantus 20 units qhs.  -  She will need strict therapy to avoid acute and chronic complications of diabetes.  -she will not need prandial insulin for now.  -continue Invokana 369m po qday. SE discussed with patient. She does not tolerate metformin.  -She is not a candidate forSGLT2 inhibitors and Incretin therapy. - Patient specific target  for A1c; LDL, HDL, Triglycerides, and  Waist Circumference were discussed in detail.  2) BP/HTN: Controlled. Continue current medications. 3) Lipids/HPL:  continue statins. 4)  Weight/Diet:  exercise, and carbohydrates information provided.  5) Chronic Care/Health Maintenance:  -Patient is on  Statin medications and encouraged to continue to follow up with Ophthalmology, Podiatrist at least yearly or according to recommendations, and advised to quit smoking. I have recommended yearly flu vaccine and pneumonia vaccination at least every 5 years; moderate intensity exercise for up to 150 minutes weekly; and  sleep for at least 7 hours a day.  - 25 minutes of time was spent on the care of this patient , 50% of which was applied for counseling on diabetes complications and their preventions.  - I advised patient to maintain close follow up with SRobert Bellow MD for primary care needs.  Patient is asked to bring meter and  blood glucose logs during their next visit.   Follow up plan: -Return in about 3 months (around 02/22/2016) for follow up with pre-visit labs, meter, and logs.  GGlade Lloyd MD Phone: [phone] Fax: [phone]  11/22/2015, 9:57 AM

## 2015-11-22 NOTE — Patient Instructions (Signed)
Advice for weight management -For most of us the best way to lose weight is by diet management. Generally speaking, diet management means restricting carbohydrate consumption to minimum possible (and to unprocessed or minimally processed complex starch) and increasing protein intake (animal or plant source), fruits, and vegetables.  -Sticking to a routine mealtime to eat 3 meals a day and avoiding unnecessary snacks is shown to have a big role in weight control.  -It is better to avoid simple carbohydrates including: Cakes, Desserts, Ice Cream, Soda (diet and regular), Sweet Tea, Candies, Chips, Cookies, Artificial Sweeteners, and "Sugar-free" Products.   -Exercise: 30 minutes a day 3-4 days a week, or 150 minutes a week. Combine stretch, strength, and aerobic activities. You may seek evaluation by your heart doctor prior to initiating exercise if you have high risk for heart disease.  -If you are interested, we can schedule a visit with Allison [name], RDN, CDE for individualized nutrition education.  

## 2015-12-11 ENCOUNTER — Ambulatory Visit (INDEPENDENT_AMBULATORY_CARE_PROVIDER_SITE_OTHER): Payer: Medicaid Other | Admitting: Orthopaedic Surgery

## 2015-12-11 ENCOUNTER — Encounter: Payer: Self-pay | Admitting: Orthopaedic Surgery

## 2015-12-11 VITALS — BP 108/73 | HR 72 | Temp 98.1°F | Ht 62.0 in | Wt 146.0 lb

## 2015-12-11 DIAGNOSIS — I10 Essential (primary) hypertension: Secondary | ICD-10-CM

## 2015-12-11 DIAGNOSIS — M25512 Pain in left shoulder: Secondary | ICD-10-CM

## 2015-12-11 DIAGNOSIS — E11311 Type 2 diabetes mellitus with unspecified diabetic retinopathy with macular edema: Secondary | ICD-10-CM | POA: Diagnosis not present

## 2015-12-11 DIAGNOSIS — Z794 Long term (current) use of insulin: Secondary | ICD-10-CM

## 2015-12-11 NOTE — Progress Notes (Signed)
Patient Allison [name], female DOB:January 12, 1971, 45 y.o. DYN:[id number]  Chief Complaint  Patient presents with  . Follow-up    left shoulder     HPI  Allison [name] is a 45 y.o. female who continues to have pain of the left shoulder.  The injection last time did not help.  Heat, ice, rest, medicines have not helped.  She has pain with overhead use.  I will get CT arthrogram of the left shoulder.  She cannot have a MRI.  She had to be sedated the last MRI she had 12 years ago and had severe reaction and flat lined for a while.  She had full CPR and responded.  HPI  Body mass index is 26.7 kg/(m^2).  ROS  Review of Systems  HENT: Negative for congestion.   Eyes: Positive for visual disturbance.  Respiratory: Positive for cough and shortness of breath.   Cardiovascular: Negative for chest pain and leg swelling.  Endocrine: Positive for cold intolerance.  Musculoskeletal: Positive for joint swelling and arthralgias.  Allergic/Immunologic: Positive for environmental allergies.    Past Medical History  Diagnosis Date  . Insulin dependent diabetes mellitus ([place])   . Hypertension   . Gastroparesis     possible  . Neuropathy ([place])   . Retinopathy   . Dyslipidemia   . Glaucoma   . 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 Cancer [facility])     cervical     Past Surgical History  Procedure Laterality Date  . Abdominal hysterectomy      partial   . Eye surgery      multiple    Family History  Problem Relation Age of Onset  . Cancer Other     family h/o  . Coronary artery disease Other     family h/o  . Diabetes Mother   . Hypertension Mother   . Cancer Father     brain   . Diabetes Father   . Stroke Father   . Iron deficiency Sister   . Hypertension Brother   . Hyperlipidemia Brother     Social History Social History  Substance Use Topics  . Smoking status: Former Smoker -- 0.00 packs/day for 26 years    Types: Cigarettes    Quit date: 05/05/2014  .  Smokeless tobacco: Never Used  . Alcohol Use: No    Allergies  Allergen Reactions  . Beta Adrenergic Blockers     Heart palpitations  . Codeine Other (See Comments)    TYLENOL #3  Welts and itching  . Cymbalta [Duloxetine Hcl]     SOB  . Doxycycline Other (See Comments)    Heart problems  . Lexapro [Escitalopram Oxalate]     Heart palpitations and hives  . Other Palpitations    Allergic to all beta blocker eye drops!!  . Oxycodone Itching    Current Outpatient Prescriptions  Medication Sig Dispense Refill  . albuterol (PROAIR HFA) 108 (90 BASE) MCG/ACT inhaler Inhale 2 puffs into the lungs every 4 (four) hours as needed for wheezing or shortness of breath.    . ALPRAZolam (XANAX) 1 MG tablet Take 1 mg by mouth 3 (three) times daily as needed for anxiety.     [name] aspirin EC 81 MG tablet Take 81 mg by mouth daily.    . blood glucose meter kit and supplies KIT Dispense based on patient and insurance preference. Use up to four times daily as directed. (FOR ICD 10 E11.65). 1 each 0  .  canagliflozin (INVOKANA) 300 MG TABS tablet Take 1 tablet (300 mg total) by mouth daily. 30 tablet 2  . diclofenac (VOLTAREN) 75 MG EC tablet Take 1 tablet (75 mg total) by mouth 2 (two) times daily with a meal. 60 tablet 2  . furosemide (LASIX) 20 MG tablet Take 20 mg by mouth 2 (two) times daily as needed.     [name] glucose blood (ACCU-CHEK AVIVA PLUS) test strip USE AS DIRECTED UP TO FOUR TIMES DAILY. 150 each 5  . HYDROcodone-acetaminophen (NORCO) 10-325 MG per tablet Take 1 tablet by mouth every 4 (four) hours as needed for severe pain.    . Insulin Glargine (LANTUS SOLOSTAR [place]) Inject 20 Units into the skin at bedtime.    . Insulin Pen Needle (B-D ULTRAFINE III SHORT PEN) 31G X 8 MM MISC 1 each by Does not apply route as directed. 100 each 3  . loratadine (CLARITIN) 10 MG tablet Take 10 mg by mouth daily.    . Multiple Vitamin (MULTIVITAMIN) tablet Take 1 tablet by mouth daily.    [name] omeprazole (PRILOSEC)  20 MG capsule Take 20 mg by mouth 2 (two) times daily before a meal.     . polyethylene glycol (MIRALAX / GLYCOLAX) packet Take 17 g by mouth daily.    . potassium chloride SA (K-DUR,KLOR-CON) 20 MEQ tablet Take 20 mEq by mouth 2 (two) times daily.      No current facility-administered medications for this visit.     Physical Exam  Blood pressure 108/73, pulse 72, temperature 98.1 F (36.7 C), height 5' 2"  (1.575 m), weight 146 lb (66.225 kg).  Constitutional: overall normal hygiene, normal nutrition, well developed, normal grooming, normal body habitus. Assistive device:none  Musculoskeletal: gait and station Limp none, muscle tone and strength are normal, no tremors or atrophy is present.  .  Neurological: coordination overall normal.  Deep tendon reflex/nerve stretch intact.  Sensation normal.  Cranial nerves II-XII intact.   Skin:   normal overall no scars, lesions, ulcers or rashes. No psoriasis.  Psychiatric: Alert and oriented x 3.  Recent memory intact, remote memory unclear.  Normal mood and affect. Well groomed.  Good eye contact.  Cardiovascular: overall no swelling, no varicosities, no edema bilaterally, normal temperatures of the legs and arms, no clubbing, cyanosis and good capillary refill.  Lymphatic: palpation is normal. Examination of left Upper Extremity is done.  Inspection:   Overall:  Elbow non-tender without crepitus or defects, forearm non-tender without crepitus or defects, wrist non-tender without crepitus or defects, hand non-tender.    Shoulder: with glenohumeral joint tenderness, without effusion.   Upper arm: without swelling and tenderness   Range of motion:   Overall:  Full range of motion of the elbow, full range of motion of wrist and full range of motion in fingers.   Shoulder:  left  145 degrees forward flexion; 100 degrees abduction; 35 degrees internal rotation, 35 degrees external rotation, 20 degrees extension, 40 degrees  adduction.   Stability:   Overall:  Shoulder, elbow and wrist stable   Strength and Tone:   Overall full shoulder muscles strength, full upper arm strength and normal upper arm bulk and tone.    The patient has been educated about the nature of the problem(s) and counseled on treatment options.  The patient appeared to understand what I have discussed and is in agreement with it.  Encounter Diagnoses  Name Primary?  . Left shoulder pain Yes  . Essential hypertension   .  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  Precautions discussed.  Continue current medications.   Return to clinic get CT arthrogram, return after that.   Electronically Signed Sanjuana Kava, MD 7/18/201710:13 AM

## 2015-12-12 ENCOUNTER — Encounter (INDEPENDENT_AMBULATORY_CARE_PROVIDER_SITE_OTHER): Payer: Medicaid Other | Admitting: Ophthalmology

## 2015-12-12 DIAGNOSIS — E10311 Type 1 diabetes mellitus with unspecified diabetic retinopathy with macular edema: Secondary | ICD-10-CM

## 2015-12-12 DIAGNOSIS — H43813 Vitreous degeneration, bilateral: Secondary | ICD-10-CM

## 2015-12-12 DIAGNOSIS — H35033 Hypertensive retinopathy, bilateral: Secondary | ICD-10-CM

## 2015-12-12 DIAGNOSIS — E103511 Type 1 diabetes mellitus with proliferative diabetic retinopathy with macular edema, right eye: Secondary | ICD-10-CM

## 2015-12-12 DIAGNOSIS — I10 Essential (primary) hypertension: Secondary | ICD-10-CM

## 2015-12-12 DIAGNOSIS — E103593 Type 1 diabetes mellitus with proliferative diabetic retinopathy without macular edema, bilateral: Secondary | ICD-10-CM | POA: Diagnosis not present

## 2015-12-18 ENCOUNTER — Other Ambulatory Visit: Payer: Self-pay | Admitting: Orthopaedic Surgery

## 2015-12-18 ENCOUNTER — Other Ambulatory Visit: Payer: Self-pay | Admitting: Radiology

## 2015-12-18 DIAGNOSIS — M25512 Pain in left shoulder: Secondary | ICD-10-CM

## 2015-12-19 ENCOUNTER — Encounter (INDEPENDENT_AMBULATORY_CARE_PROVIDER_SITE_OTHER): Payer: Medicaid Other | Admitting: Ophthalmology

## 2015-12-20 ENCOUNTER — Other Ambulatory Visit: Payer: Self-pay | Admitting: Radiology

## 2015-12-20 DIAGNOSIS — M25512 Pain in left shoulder: Secondary | ICD-10-CM

## 2015-12-24 ENCOUNTER — Ambulatory Visit (HOSPITAL_COMMUNITY)
Admission: RE | Admit: 2015-12-24 | Discharge: 2015-12-24 | Disposition: A | Discharge status: Home / Self Care | Payer: Medicaid Other | Source: Ambulatory Visit | Attending: Orthopaedic Surgery | Admitting: Orthopaedic Surgery

## 2015-12-24 ENCOUNTER — Ambulatory Visit (HOSPITAL_COMMUNITY): Payer: Medicaid Other

## 2015-12-24 DIAGNOSIS — M25512 Pain in left shoulder: Secondary | ICD-10-CM

## 2015-12-24 MED ORDER — IOPAMIDOL (ISOVUE-M 200) INJECTION 41%
15.0000 mL | Freq: Once | INTRAMUSCULAR | Status: AC
  Administered 2015-12-24: 10 mL via INTRA_ARTICULAR

## 2015-12-24 MED ORDER — LIDOCAINE HCL (PF) 1 % IJ SOLN
5.0000 mL | Freq: Once | INTRAMUSCULAR | Status: AC
  Administered 2015-12-24: 5 mL via INTRADERMAL

## 2015-12-24 MED ORDER — IOPAMIDOL (ISOVUE-M 200) INJECTION 41%
INTRAMUSCULAR | Status: AC
  Filled 2015-12-24: qty 10

## 2015-12-24 MED ORDER — LIDOCAINE HCL 1 % IJ SOLN
INTRAMUSCULAR | Status: AC
  Filled 2015-12-24: qty 20

## 2015-12-26 ENCOUNTER — Encounter: Payer: Self-pay | Admitting: Orthopaedic Surgery

## 2015-12-26 ENCOUNTER — Ambulatory Visit (INDEPENDENT_AMBULATORY_CARE_PROVIDER_SITE_OTHER): Payer: Medicaid Other | Admitting: Orthopaedic Surgery

## 2015-12-26 VITALS — BP 114/79 | HR 76 | Temp 97.9°F | Ht 62.0 in | Wt 144.6 lb

## 2015-12-26 DIAGNOSIS — I10 Essential (primary) hypertension: Secondary | ICD-10-CM | POA: Diagnosis not present

## 2015-12-26 DIAGNOSIS — Z794 Long term (current) use of insulin: Secondary | ICD-10-CM

## 2015-12-26 DIAGNOSIS — M25512 Pain in left shoulder: Secondary | ICD-10-CM

## 2015-12-26 DIAGNOSIS — E11311 Type 2 diabetes mellitus with unspecified diabetic retinopathy with macular edema: Secondary | ICD-10-CM

## 2015-12-26 NOTE — Progress Notes (Signed)
Patient Allison [name], female DOB:Jun 26, 1970, 45 y.o. XKG:[id number]  Chief Complaint  Patient presents with  . Results    CT with arthogram    HPI  Allison [name] is a 45 y.o. female who has left shoulder pain. She had the CT arthrogram of the left shoulder showing: IMPRESSION: No full-thickness retracted rotator cuff tear. Intact long head biceps tendon and normal glenoid labrum. No significant findings for bony impingement.  I went over the findings with her.  She does not need any surgery.  She will begin PT in [place].  She is to take one Aleve bid pc po.  HPI  Body mass index is 26.45 kg/m.  ROS  Review of Systems  HENT: Negative for congestion.   Eyes: Positive for visual disturbance.  Respiratory: Positive for cough and shortness of breath.   Cardiovascular: Negative for chest pain and leg swelling.  Endocrine: Positive for cold intolerance.  Musculoskeletal: Positive for arthralgias and joint swelling.  Allergic/Immunologic: Positive for environmental allergies.    Past Medical History:  Diagnosis Date  . Cancer (H[facility])    cervical   . Dyslipidemia   . Gastroparesis    possible  . Glaucoma   . Hypertension   . Insulin dependent diabetes mellitus ([place])   . Neuropathy ([place])   . Retinopathy   . 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 ABDOMINAL HYSTERECTOMY     partial   . EYE SURGERY     multiple    Family History  Problem Relation Age of Onset  . Diabetes Mother   . Hypertension Mother   . Cancer Father     brain   . Diabetes Father   . Stroke Father   . Iron deficiency Sister   . Hypertension Brother   . Hyperlipidemia Brother   . Cancer Other     family h/o  . Coronary artery disease Other     family h/o    Social History Social History  Substance Use Topics  . Smoking status: Current Some Day Smoker    Packs/day: 0.00    Years: 26.00    Types: Cigarettes    Last  attempt to quit: 05/05/2014  . Smokeless tobacco: Never Used  . Alcohol use No    Allergies  Allergen Reactions  . Beta Adrenergic Blockers     Heart palpitations  . Codeine Other (See Comments)    TYLENOL #3  Welts and itching  . Cymbalta [Duloxetine Hcl]     SOB  . Doxycycline Other (See Comments)    Heart problems  . Lexapro [Escitalopram Oxalate]     Heart palpitations and hives  . Other Palpitations    Allergic to all beta blocker eye drops!!  . Oxycodone Itching    Current Outpatient Prescriptions  Medication Sig Dispense Refill  . albuterol (PROAIR HFA) 108 (90 BASE) MCG/ACT inhaler Inhale 2 puffs into the lungs every 4 (four) hours as needed for wheezing or shortness of breath.    . ALPRAZolam (XANAX) 1 MG tablet Take 1 mg by mouth 3 (three) times daily as needed for anxiety.     [name] aspirin EC 81 MG tablet Take 81 mg by mouth daily.    . blood glucose meter kit and supplies KIT Dispense based on patient and insurance preference. Use up to four times daily as directed. (FOR ICD 10 E11.65). 1 each 0  . canagliflozin (INVOKANA) 300 MG TABS  tablet Take 1 tablet (300 mg total) by mouth daily. 30 tablet 2  . diclofenac (VOLTAREN) 75 MG EC tablet Take 1 tablet (75 mg total) by mouth 2 (two) times daily with a meal. 60 tablet 2  . furosemide (LASIX) 20 MG tablet Take 20 mg by mouth 2 (two) times daily as needed.     [name] glucose blood (ACCU-CHEK AVIVA PLUS) test strip USE AS DIRECTED UP TO FOUR TIMES DAILY. 150 each 5  . HYDROcodone-acetaminophen (NORCO) 10-325 MG per tablet Take 1 tablet by mouth every 4 (four) hours as needed for severe pain.    . Insulin Glargine (LANTUS SOLOSTAR [place]) Inject 20 Units into the skin at bedtime.    . Insulin Pen Needle (B-D ULTRAFINE III SHORT PEN) 31G X 8 MM MISC 1 each by Does not apply route as directed. 100 each 3  . loratadine (CLARITIN) 10 MG tablet Take 10 mg by mouth daily.    . Multiple Vitamin (MULTIVITAMIN) tablet Take 1 tablet by mouth  daily.    [name] omeprazole (PRILOSEC) 20 MG capsule Take 20 mg by mouth 2 (two) times daily before a meal.     . polyethylene glycol (MIRALAX / GLYCOLAX) packet Take 17 g by mouth daily.    . potassium chloride SA (K-DUR,KLOR-CON) 20 MEQ tablet Take 20 mEq by mouth 2 (two) times daily.      No current facility-administered medications for this visit.      Physical Exam  Blood pressure 114/79, pulse 76, temperature 97.9 F (36.6 C), height _0  (1.575 m), weight 144 lb 9.6 oz (65.6 kg).  Constitutional: overall normal hygiene, normal nutrition, well developed, normal grooming, normal body habitus. Assistive device:none  Musculoskeletal: gait and station Limp none, muscle tone and strength are normal, no tremors or atrophy is present.  .  Neurological: coordination overall normal.  Deep tendon reflex/nerve stretch intact.  Sensation normal.  Cranial nerves II-XII intact.   Skin:   normal overall no scars, lesions, ulcers or rashes. No psoriasis.  Psychiatric: Alert and oriented x 3.  Recent memory intact, remote memory unclear.  Normal mood and affect. Well groomed.  Good eye contact.  Cardiovascular: overall no swelling, no varicosities, no edema bilaterally, normal temperatures of the legs and arms, no clubbing, cyanosis and good capillary refill.  Lymphatic: palpation is normal.  Examination of left Upper Extremity is done.  Inspection:   Overall:  Elbow non-tender without crepitus or defects, forearm non-tender without crepitus or defects, wrist non-tender without crepitus or defects, hand non-tender.    Shoulder: with glenohumeral joint tenderness, without effusion.   Upper arm: without swelling and tenderness   Range of motion:   Overall:  Full range of motion of the elbow, full range of motion of wrist and full range of motion in fingers.   Shoulder:  left  145 degrees forward flexion; 100 degrees abduction; 35 degrees internal rotation, 35 degrees external rotation, 20 degrees  extension, 40 degrees adduction.   Stability:   Overall:  Shoulder, elbow and wrist stable   Strength and Tone:   Overall full shoulder muscles strength, full upper arm strength and normal upper arm bulk and tone.   The patient has been educated about the nature of the problem(s) and counseled on treatment options.  The patient appeared to understand what I have discussed and is in agreement with it.  Encounter Diagnoses  Name Primary?  . Left shoulder pain Yes  . Essential hypertension   . Type 2  diabetes mellitus with both eyes affected by retinopathy and macular edema, with long-term current use of insulin, unspecified retinopathy severity ([place])     PLAN Call if any problems.  Precautions discussed.  Continue current medications.   Return to clinic 2 weeks   Begin PT/OT in [place].  Electronically Signed Sanjuana Kava, MD 8/2/20179:41 AM

## 2016-01-01 ENCOUNTER — Telehealth: Payer: Self-pay | Admitting: Orthopaedic Surgery

## 2016-01-01 NOTE — Telephone Encounter (Signed)
Bethena Roys from [facility] in [place], [place] called asking for a order for PT and demographics on this patient. The patient has already scheduled an appointment with them tomorrow @ 1:30pm. This is the new name for [facility].  Phone#: [phone] Fax#: [phone]

## 2016-01-02 NOTE — Telephone Encounter (Signed)
The order and demographics were sent.

## 2016-01-09 ENCOUNTER — Ambulatory Visit: Payer: Medicaid Other | Admitting: Orthopaedic Surgery

## 2016-01-16 ENCOUNTER — Ambulatory Visit (INDEPENDENT_AMBULATORY_CARE_PROVIDER_SITE_OTHER): Payer: Medicaid Other | Admitting: Orthopaedic Surgery

## 2016-01-16 ENCOUNTER — Encounter: Payer: Self-pay | Admitting: Orthopaedic Surgery

## 2016-01-16 VITALS — BP 108/71 | HR 74 | Temp 97.3°F | Ht 63.0 in | Wt 145.0 lb

## 2016-01-16 DIAGNOSIS — M25512 Pain in left shoulder: Secondary | ICD-10-CM | POA: Diagnosis not present

## 2016-01-16 DIAGNOSIS — F17200 Nicotine dependence, unspecified, uncomplicated: Secondary | ICD-10-CM

## 2016-01-16 DIAGNOSIS — Z720 Tobacco use: Secondary | ICD-10-CM

## 2016-01-16 NOTE — Patient Instructions (Signed)
Continue therapy  At [facility] [phone].

## 2016-01-16 NOTE — Progress Notes (Signed)
Patient Allison [name], female DOB:03/13/1971, 45 y.o. CXK:[id number]  Chief Complaint  Patient presents with  . Follow-up    left shoulder pain    HPI  Allison [name] is a 45 y.o. female who has chronic pain of the left shoulder.  She has been going to OT in [place].  I have copies of their notes and I have reviewed them.  She is progressing slowly. She is using bands now for exercises.  She has less pain and more motion.  She smoke and I have talked to her about this.  She has tried to quit and not been very successful. HPI  Body mass index is 25.69 kg/m.  ROS  Review of Systems  HENT: Negative for congestion.   Eyes: Positive for visual disturbance.  Respiratory: Positive for cough and shortness of breath.   Cardiovascular: Negative for chest pain and leg swelling.  Endocrine: Positive for cold intolerance.  Musculoskeletal: Positive for arthralgias and joint swelling.  Allergic/Immunologic: Positive for environmental allergies.    Past Medical History:  Diagnosis Date  . Cancer (HCC)    cervical   . Dyslipidemia   . Gastroparesis    possible  . Glaucoma   . Hypertension   . Insulin dependent diabetes mellitus ([place])   . Neuropathy ([place])   . Retinopathy   . 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 ABDOMINAL HYSTERECTOMY     partial   . EYE SURGERY     multiple    Family History  Problem Relation Age of Onset  . Diabetes Mother   . Hypertension Mother   . Cancer Father     brain   . Diabetes Father   . Stroke Father   . Iron deficiency Sister   . Hypertension Brother   . Hyperlipidemia Brother   . Cancer Other     family h/o  . Coronary artery disease Other     family h/o    Social History Social History  Substance Use Topics  . Smoking status: Current Some Day Smoker    Packs/day: 0.00    Years: 26.00    Types: Cigarettes    Last attempt to quit: 05/05/2014  . Smokeless tobacco:  Never Used  . Alcohol use No    Allergies  Allergen Reactions  . Beta Adrenergic Blockers     Heart palpitations  . Codeine Other (See Comments)    TYLENOL #3  Welts and itching  . Cymbalta [Duloxetine Hcl]     SOB  . Doxycycline Other (See Comments)    Heart problems  . Lexapro [Escitalopram Oxalate]     Heart palpitations and hives  . Other Palpitations    Allergic to all beta blocker eye drops!!  . Oxycodone Itching    Current Outpatient Prescriptions  Medication Sig Dispense Refill  . albuterol (PROAIR HFA) 108 (90 BASE) MCG/ACT inhaler Inhale 2 puffs into the lungs every 4 (four) hours as needed for wheezing or shortness of breath.    . ALPRAZolam (XANAX) 1 MG tablet Take 1 mg by mouth 3 (three) times daily as needed for anxiety.     [name] aspirin EC 81 MG tablet Take 81 mg by mouth daily.    . blood glucose meter kit and supplies KIT Dispense based on patient and insurance preference. Use up to four times daily as directed. (FOR ICD 10 E11.65). 1 each 0  . canagliflozin (INVOKANA) 300 MG  TABS tablet Take 1 tablet (300 mg total) by mouth daily. 30 tablet 2  . diclofenac (VOLTAREN) 75 MG EC tablet Take 1 tablet (75 mg total) by mouth 2 (two) times daily with a meal. 60 tablet 2  . furosemide (LASIX) 20 MG tablet Take 20 mg by mouth 2 (two) times daily as needed.     [name] glucose blood (ACCU-CHEK AVIVA PLUS) test strip USE AS DIRECTED UP TO FOUR TIMES DAILY. 150 each 5  . HYDROcodone-acetaminophen (NORCO) 10-325 MG per tablet Take 1 tablet by mouth every 4 (four) hours as needed for severe pain.    . Insulin Glargine (LANTUS SOLOSTAR [place]) Inject 20 Units into the skin at bedtime.    . Insulin Pen Needle (B-D ULTRAFINE III SHORT PEN) 31G X 8 MM MISC 1 each by Does not apply route as directed. 100 each 3  . loratadine (CLARITIN) 10 MG tablet Take 10 mg by mouth daily.    . Multiple Vitamin (MULTIVITAMIN) tablet Take 1 tablet by mouth daily.    [name] omeprazole (PRILOSEC) 20 MG capsule Take  20 mg by mouth 2 (two) times daily before a meal.     . polyethylene glycol (MIRALAX / GLYCOLAX) packet Take 17 g by mouth daily.    . potassium chloride SA (K-DUR,KLOR-CON) 20 MEQ tablet Take 20 mEq by mouth 2 (two) times daily.      No current facility-administered medications for this visit.      Physical Exam  Blood pressure 108/71, pulse 74, temperature 97.3 F (36.3 C), height 5' 3"  (1.6 m), weight 145 lb (65.8 kg).  Constitutional: overall normal hygiene, normal nutrition, well developed, normal grooming, normal body habitus. Assistive device:none  Musculoskeletal: gait and station Limp none, muscle tone and strength are normal, no tremors or atrophy is present.  .  Neurological: coordination overall normal.  Deep tendon reflex/nerve stretch intact.  Sensation normal.  Cranial nerves II-XII intact.   Skin:   normal overall no scars, lesions, ulcers or rashes. No psoriasis.  Psychiatric: Alert and oriented x 3.  Recent memory intact, remote memory unclear.  Normal mood and affect. Well groomed.  Good eye contact.  Cardiovascular: overall no swelling, no varicosities, no edema bilaterally, normal temperatures of the legs and arms, no clubbing, cyanosis and good capillary refill.  Lymphatic: palpation is normal.  Examination of left Upper Extremity is done.  Inspection:   Overall:  Elbow non-tender without crepitus or defects, forearm non-tender without crepitus or defects, wrist non-tender without crepitus or defects, hand non-tender.    Shoulder: with glenohumeral joint tenderness, without effusion.   Upper arm: without swelling and tenderness   Range of motion:   Overall:  Full range of motion of the elbow, full range of motion of wrist and full range of motion in fingers.   Shoulder:  left  170 degrees forward flexion; 160 degrees abduction; 35 degrees internal rotation, 35 degrees external rotation, 10 degrees extension, 40 degrees adduction.   Stability:   Overall:   Shoulder, elbow and wrist stable   Strength and Tone:   Overall full shoulder muscles strength, full upper arm strength and normal upper arm bulk and tone.   The patient has been educated about the nature of the problem(s) and counseled on treatment options.  The patient appeared to understand what I have discussed and is in agreement with it.  Encounter Diagnoses  Name Primary?  . Left shoulder pain Yes  . Tobacco smoker within last 12 months  PLAN Call if any problems.  Precautions discussed.  Continue current medications.   Return to clinic 1 month   Continue exercises in OT.  Electronically Signed Sanjuana Kava, MD 8/23/201710:52 AM

## 2016-01-24 ENCOUNTER — Encounter (INDEPENDENT_AMBULATORY_CARE_PROVIDER_SITE_OTHER): Payer: Medicaid Other | Admitting: Ophthalmology

## 2016-01-24 DIAGNOSIS — H43813 Vitreous degeneration, bilateral: Secondary | ICD-10-CM

## 2016-01-24 DIAGNOSIS — E103513 Type 1 diabetes mellitus with proliferative diabetic retinopathy with macular edema, bilateral: Secondary | ICD-10-CM

## 2016-01-24 DIAGNOSIS — H35033 Hypertensive retinopathy, bilateral: Secondary | ICD-10-CM | POA: Diagnosis not present

## 2016-01-24 DIAGNOSIS — E10311 Type 1 diabetes mellitus with unspecified diabetic retinopathy with macular edema: Secondary | ICD-10-CM | POA: Diagnosis not present

## 2016-01-24 DIAGNOSIS — I10 Essential (primary) hypertension: Secondary | ICD-10-CM

## 2016-01-31 ENCOUNTER — Encounter (INDEPENDENT_AMBULATORY_CARE_PROVIDER_SITE_OTHER): Payer: Medicaid Other | Admitting: Ophthalmology

## 2016-01-31 DIAGNOSIS — E103592 Type 1 diabetes mellitus with proliferative diabetic retinopathy without macular edema, left eye: Secondary | ICD-10-CM | POA: Diagnosis not present

## 2016-01-31 DIAGNOSIS — H35033 Hypertensive retinopathy, bilateral: Secondary | ICD-10-CM

## 2016-01-31 DIAGNOSIS — E103511 Type 1 diabetes mellitus with proliferative diabetic retinopathy with macular edema, right eye: Secondary | ICD-10-CM | POA: Diagnosis not present

## 2016-01-31 DIAGNOSIS — H43813 Vitreous degeneration, bilateral: Secondary | ICD-10-CM | POA: Diagnosis not present

## 2016-01-31 DIAGNOSIS — I10 Essential (primary) hypertension: Secondary | ICD-10-CM | POA: Diagnosis not present

## 2016-01-31 DIAGNOSIS — E10311 Type 1 diabetes mellitus with unspecified diabetic retinopathy with macular edema: Secondary | ICD-10-CM

## 2016-02-04 ENCOUNTER — Telehealth: Payer: Self-pay | Admitting: Orthopaedic Surgery

## 2016-02-04 NOTE — Telephone Encounter (Signed)
Patient called and said that she had been sick for a couple weeks and did not get to go to PT in [place]. She said that she contacted PT and they told her that they would need another order faxed to them.  Please advise

## 2016-02-05 NOTE — Telephone Encounter (Signed)
Order reprinted and signed.  Faxed to [phone].  Patient notified of the new order.

## 2016-02-12 ENCOUNTER — Ambulatory Visit: Payer: Medicaid Other | Admitting: Orthopaedic Surgery

## 2016-02-13 ENCOUNTER — Telehealth: Payer: Self-pay

## 2016-02-13 NOTE — Telephone Encounter (Signed)
She can stay off of it until next visit, will probably change it to [facility] when she comes back.

## 2016-02-13 NOTE — Telephone Encounter (Signed)
[facility] called to get physician override for Invokana. They state that they need the ok to dispense this medication to the pt since she has a foot infection.

## 2016-02-14 NOTE — Telephone Encounter (Signed)
Invokana d/ced

## 2016-02-21 ENCOUNTER — Encounter: Payer: Self-pay | Admitting: Orthopaedic Surgery

## 2016-02-21 ENCOUNTER — Other Ambulatory Visit: Payer: Self-pay | Admitting: "Endocrinology

## 2016-02-21 ENCOUNTER — Ambulatory Visit (INDEPENDENT_AMBULATORY_CARE_PROVIDER_SITE_OTHER): Payer: Medicaid Other | Admitting: Orthopaedic Surgery

## 2016-02-21 VITALS — BP 122/76 | HR 73 | Temp 98.1°F | Ht 63.0 in | Wt 147.0 lb

## 2016-02-21 DIAGNOSIS — Z720 Tobacco use: Secondary | ICD-10-CM | POA: Diagnosis not present

## 2016-02-21 DIAGNOSIS — M25512 Pain in left shoulder: Secondary | ICD-10-CM

## 2016-02-21 DIAGNOSIS — F17210 Nicotine dependence, cigarettes, uncomplicated: Secondary | ICD-10-CM

## 2016-02-21 DIAGNOSIS — F17200 Nicotine dependence, unspecified, uncomplicated: Secondary | ICD-10-CM

## 2016-02-21 LAB — COMPLETE METABOLIC PANEL WITH GFR
ALBUMIN: 3.9 g/dL (ref 3.6–5.1)
ALK PHOS: 42 U/L (ref 33–115)
ALT: 14 U/L (ref 6–29)
AST: 12 U/L (ref 10–30)
BILIRUBIN TOTAL: 0.3 mg/dL (ref 0.2–1.2)
BUN: 16 mg/dL (ref 7–25)
CO2: 25 mmol/L (ref 20–31)
CREATININE: 0.58 mg/dL (ref 0.50–1.10)
Calcium: 9.4 mg/dL (ref 8.6–10.2)
Chloride: 105 mmol/L (ref 98–110)
GFR, Est African American: >89 mL/min (ref >=60)
GFR, Est Non African American: >89 mL/min (ref >=60)
GLUCOSE: 149 mg/dL — AB (ref 65–99)
Potassium: 4.5 mmol/L (ref 3.5–5.3)
SODIUM: 139 mmol/L (ref 135–146)
TOTAL PROTEIN: 6.4 g/dL (ref 6.1–8.1)

## 2016-02-21 LAB — HEMOGLOBIN A1C
Hgb A1c MFr Bld: 6.7 % — ABNORMAL HIGH (ref <5.7)
Mean Plasma Glucose: 146 mg/dL

## 2016-02-21 NOTE — Patient Instructions (Signed)
Smoking Cessation, Tips for Success If you are ready to quit smoking, congratulations! You have chosen to help yourself be healthier. Cigarettes bring nicotine, tar, carbon monoxide, and other irritants into your body. Your lungs, heart, and blood vessels will be able to work better without these poisons. There are many different ways to quit smoking. Nicotine gum, nicotine patches, a nicotine inhaler, or nicotine nasal spray can help with physical craving. Hypnosis, support groups, and medicines help break the habit of smoking. WHAT THINGS CAN I DO TO MAKE QUITTING EASIER?  Here are some tips to help you quit for good:  Pick a date when you will quit smoking completely. Tell all of your friends and family about your plan to quit on that date.  Do not try to slowly cut down on the number of cigarettes you are smoking. Pick a quit date and quit smoking completely starting on that day.  Throw away all cigarettes.   Clean and remove all ashtrays from your home, work, and car.  On a card, write down your reasons for quitting. Carry the card with you and read it when you get the urge to smoke.  Cleanse your body of nicotine. Drink enough water and fluids to keep your urine clear or pale yellow. Do this after quitting to flush the nicotine from your body.  Learn to predict your moods. Do not let a bad situation be your excuse to have a cigarette. Some situations in your life might tempt you into wanting a cigarette.  Never have "just one" cigarette. It leads to wanting another and another. Remind yourself of your decision to quit.  Change habits associated with smoking. If you smoked while driving or when feeling stressed, try other activities to replace smoking. Stand up when drinking your coffee. Brush your teeth after eating. Sit in a different chair when you read the paper. Avoid alcohol while trying to quit, and try to drink fewer caffeinated beverages. Alcohol and caffeine may urge you to  smoke.  Avoid foods and drinks that can trigger a desire to smoke, such as sugary or spicy foods and alcohol.  Ask people who smoke not to smoke around you.  Have something planned to do right after eating or having a cup of coffee. For example, plan to take a walk or exercise.  Try a relaxation exercise to calm you down and decrease your stress. Remember, you may be tense and nervous for the first 2 weeks after you quit, but this will pass.  Find new activities to keep your hands busy. Play with a pen, coin, or rubber band. Doodle or draw things on paper.  Brush your teeth right after eating. This will help cut down on the craving for the taste of tobacco after meals. You can also try mouthwash.   Use oral substitutes in place of cigarettes. Try using lemon drops, carrots, cinnamon sticks, or chewing gum. Keep them handy so they are available when you have the urge to smoke.  When you have the urge to smoke, try deep breathing.  Designate your home as a nonsmoking area.  If you are a heavy smoker, ask your health care provider about a prescription for nicotine chewing gum. It can ease your withdrawal from nicotine.  Reward yourself. Set aside the cigarette money you save and buy yourself something nice.  Look for support from others. Join a support group or smoking cessation program. Ask someone at home or at work to help you with your plan   to quit smoking.  Always ask yourself, "Do I need this cigarette or is this just a reflex?" Tell yourself, "Today, I choose not to smoke," or "I do not want to smoke." You are reminding yourself of your decision to quit.  Do not replace cigarette smoking with electronic cigarettes (commonly called e-cigarettes). The safety of e-cigarettes is unknown, and some may contain harmful chemicals.  If you relapse, do not give up! Plan ahead and think about what you will do the next time you get the urge to smoke. HOW WILL I FEEL WHEN I QUIT SMOKING? You  may have symptoms of withdrawal because your body is used to nicotine (the addictive substance in cigarettes). You may crave cigarettes, be irritable, feel very hungry, cough often, get headaches, or have difficulty concentrating. The withdrawal symptoms are only temporary. They are strongest when you first quit but will go away within 10-14 days. When withdrawal symptoms occur, stay in control. Think about your reasons for quitting. Remind yourself that these are signs that your body is healing and getting used to being without cigarettes. Remember that withdrawal symptoms are easier to treat than the major diseases that smoking can cause.  Even after the withdrawal is over, expect periodic urges to smoke. However, these cravings are generally short lived and will go away whether you smoke or not. Do not smoke! WHAT RESOURCES ARE AVAILABLE TO HELP ME QUIT SMOKING? Your health care provider can direct you to community resources or hospitals for support, which may include:  Group support.  Education.  Hypnosis.  Therapy.   This information is not intended to replace advice given to you by your health care provider. Make sure you discuss any questions you have with your health care provider.   Document Released: 02/08/2004 Document Revised: 06/02/2014 Document Reviewed: 10/28/2012 Elsevier Interactive Patient Education 2016 Elsevier Inc.  

## 2016-02-21 NOTE — Progress Notes (Signed)
Patient Allison [name], female DOB:12-27-1970, 45 y.o. HOZ:[id number]  Chief Complaint  Patient presents with  . Follow-up    left shoulder pain    HPI  Allison [name] is a 45 y.o. female who has left shoulder pain. She has been to OT in [place] but [place] will not pay for any more treatments.  She knows the exercises to do and will do them at home.  Her shoulder hurts more now after therapy but she can move it more. HPI  Body mass index is 26.04 kg/m.  ROS  Review of Systems  HENT: Negative for congestion.   Eyes: Positive for visual disturbance.  Respiratory: Positive for cough and shortness of breath.   Cardiovascular: Negative for chest pain and leg swelling.  Endocrine: Positive for cold intolerance.  Musculoskeletal: Positive for arthralgias and joint swelling.  Allergic/Immunologic: Positive for environmental allergies.    Past Medical History:  Diagnosis Date  . Cancer (HCC)    cervical   . Dyslipidemia   . Gastroparesis    possible  . Glaucoma   . Hypertension   . Insulin dependent diabetes mellitus ([place])   . Neuropathy ([place])   . Retinopathy   . 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 ABDOMINAL HYSTERECTOMY     partial   . EYE SURGERY     multiple    Family History  Problem Relation Age of Onset  . Diabetes Mother   . Hypertension Mother   . Cancer Father     brain   . Diabetes Father   . Stroke Father   . Iron deficiency Sister   . Hypertension Brother   . Hyperlipidemia Brother   . Cancer Other     family h/o  . Coronary artery disease Other     family h/o    Social History Social History  Substance Use Topics  . Smoking status: Current Some Day Smoker    Packs/day: 0.00    Years: 26.00    Types: Cigarettes    Last attempt to quit: 05/05/2014  . Smokeless tobacco: Never Used  . Alcohol use No    Allergies  Allergen Reactions  . Beta Adrenergic Blockers     Heart  palpitations  . Codeine Other (See Comments)    TYLENOL #3  Welts and itching  . Cymbalta [Duloxetine Hcl]     SOB  . Doxycycline Other (See Comments)    Heart problems  . Lexapro [Escitalopram Oxalate]     Heart palpitations and hives  . Other Palpitations    Allergic to all beta blocker eye drops!!  . Oxycodone Itching    Current Outpatient Prescriptions  Medication Sig Dispense Refill  . albuterol (PROAIR HFA) 108 (90 BASE) MCG/ACT inhaler Inhale 2 puffs into the lungs every 4 (four) hours as needed for wheezing or shortness of breath.    . ALPRAZolam (XANAX) 1 MG tablet Take 1 mg by mouth 3 (three) times daily as needed for anxiety.     [name] aspirin EC 81 MG tablet Take 81 mg by mouth daily.    . blood glucose meter kit and supplies KIT Dispense based on patient and insurance preference. Use up to four times daily as directed. (FOR ICD 10 E11.65). 1 each 0  . canagliflozin (INVOKANA) 300 MG TABS tablet Take 1 tablet (300 mg total) by mouth daily. 30 tablet 2  . diclofenac (VOLTAREN) 75 MG EC tablet Take  1 tablet (75 mg total) by mouth 2 (two) times daily with a meal. 60 tablet 2  . furosemide (LASIX) 20 MG tablet Take 20 mg by mouth 2 (two) times daily as needed.     [name] glucose blood (ACCU-CHEK AVIVA PLUS) test strip USE AS DIRECTED UP TO FOUR TIMES DAILY. 150 each 5  . HYDROcodone-acetaminophen (NORCO) 10-325 MG per tablet Take 1 tablet by mouth every 4 (four) hours as needed for severe pain.    . Insulin Glargine (LANTUS SOLOSTAR [place]) Inject 20 Units into the skin at bedtime.    . Insulin Pen Needle (B-D ULTRAFINE III SHORT PEN) 31G X 8 MM MISC 1 each by Does not apply route as directed. 100 each 3  . loratadine (CLARITIN) 10 MG tablet Take 10 mg by mouth daily.    . Multiple Vitamin (MULTIVITAMIN) tablet Take 1 tablet by mouth daily.    [name] omeprazole (PRILOSEC) 20 MG capsule Take 20 mg by mouth 2 (two) times daily before a meal.     . polyethylene glycol (MIRALAX / GLYCOLAX) packet  Take 17 g by mouth daily.    . potassium chloride SA (K-DUR,KLOR-CON) 20 MEQ tablet Take 20 mEq by mouth 2 (two) times daily.      No current facility-administered medications for this visit.      Physical Exam  Blood pressure 122/76, pulse 73, temperature 98.1 F (36.7 C), height _0  (1.6 m), weight 147 lb (66.7 kg).  Constitutional: overall normal hygiene, normal nutrition, well developed, normal grooming, normal body habitus. Assistive device:none  Musculoskeletal: gait and station Limp none, muscle tone and strength are normal, no tremors or atrophy is present.  .  Neurological: coordination overall normal.  Deep tendon reflex/nerve stretch intact.  Sensation normal.  Cranial nerves II-XII intact.   Skin:   Normal overall no scars, lesions, ulcers or rashes. No psoriasis.  Psychiatric: Alert and oriented x 3.  Recent memory intact, remote memory unclear.  Normal mood and affect. Well groomed.  Good eye contact.  Cardiovascular: overall no swelling, no varicosities, no edema bilaterally, normal temperatures of the legs and arms, no clubbing, cyanosis and good capillary refill.  Lymphatic: palpation is normal.  She smokes and is cutting way back now.  She knows she should stop.  She has what sounds like sleep apnea.  I told her to talk to her family doctor and get referral for sleep study.  Examination of left Upper Extremity is done.  Inspection:   Overall:  Elbow non-tender without crepitus or defects, forearm non-tender without crepitus or defects, wrist non-tender without crepitus or defects, hand non-tender.    Shoulder: with glenohumeral joint tenderness, without effusion.   Upper arm: without swelling and tenderness   Range of motion:   Overall:  Full range of motion of the elbow, full range of motion of wrist and full range of motion in fingers.   Shoulder:  left  145 degrees forward flexion; 100 degrees abduction; 30 degrees internal rotation, 15 degrees external  rotation, 15 degrees extension, 40 degrees adduction.   Stability:   Overall:  Shoulder, elbow and wrist stable   Strength and Tone:   Overall full shoulder muscles strength, full upper arm strength and normal upper arm bulk and tone.   The patient has been educated about the nature of the problem(s) and counseled on treatment options.  The patient appeared to understand what I have discussed and is in agreement with it.  Encounter Diagnoses  Name Primary?  [name]  Left shoulder pain Yes  . Tobacco smoker within last 12 months   . Cigarette nicotine dependence without complication     PLAN Call if any problems.  Precautions discussed.  Continue current medications.   Return to clinic 1 month   Do exercises at home.  Electronically Signed Sanjuana Kava, MD 9/28/201710:18 AM

## 2016-02-29 ENCOUNTER — Ambulatory Visit (INDEPENDENT_AMBULATORY_CARE_PROVIDER_SITE_OTHER): Payer: Medicaid Other | Admitting: "Endocrinology

## 2016-02-29 ENCOUNTER — Encounter: Payer: Self-pay | Admitting: "Endocrinology

## 2016-02-29 VITALS — BP 114/83 | HR 67 | Ht 63.0 in | Wt 148.0 lb

## 2016-02-29 DIAGNOSIS — E1142 Type 2 diabetes mellitus with diabetic polyneuropathy: Secondary | ICD-10-CM

## 2016-02-29 DIAGNOSIS — E782 Mixed hyperlipidemia: Secondary | ICD-10-CM

## 2016-02-29 DIAGNOSIS — I10 Essential (primary) hypertension: Secondary | ICD-10-CM

## 2016-02-29 DIAGNOSIS — Z794 Long term (current) use of insulin: Secondary | ICD-10-CM

## 2016-02-29 DIAGNOSIS — E11311 Type 2 diabetes mellitus with unspecified diabetic retinopathy with macular edema: Secondary | ICD-10-CM | POA: Diagnosis not present

## 2016-02-29 MED ORDER — CANAGLIFLOZIN 100 MG PO TABS: 100.0000 mg | ORAL_TABLET | Freq: Every day | ORAL | 3 refills | Status: DC

## 2016-02-29 NOTE — Patient Instructions (Signed)
Advice for weight management -For most of us the best way to lose weight is by diet management. Generally speaking, diet management means restricting carbohydrate consumption to minimum possible (and to unprocessed or minimally processed complex starch) and increasing protein intake (animal or plant source), fruits, and vegetables.  -Sticking to a routine mealtime to eat 3 meals a day and avoiding unnecessary snacks is shown to have a big role in weight control.  -It is better to avoid simple carbohydrates including: Cakes, Desserts, Ice Cream, Soda (diet and regular), Sweet Tea, Candies, Chips, Cookies, Artificial Sweeteners, and "Sugar-free" Products.   -Exercise: 30 minutes a day 3-4 days a week, or 150 minutes a week. Combine stretch, strength, and aerobic activities. You may seek evaluation by your heart doctor prior to initiating exercise if you have high risk for heart disease.  -If you are interested, we can schedule a visit with Allison [name], RDN, CDE for individualized nutrition education.  

## 2016-02-29 NOTE — Progress Notes (Signed)
Subjective:    Patient ID: Allison [name], female    DOB: [birth year], PCP Robert Bellow, MD   Past Medical History:  Diagnosis Date  . Cancer (HCC)    cervical   . Dyslipidemia   . Gastroparesis    possible  . Glaucoma   . Hypertension   . Insulin dependent diabetes mellitus ([place])   . Neuropathy ([place])   . Retinopathy   . 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 ABDOMINAL HYSTERECTOMY     partial   . EYE SURGERY     multiple   Social History   Social History  . Marital status: Divorced    Spouse name: N/A  . Number of children: N/A  . Years of education: N/A   Social History Main Topics  . Smoking status: Current Some Day Smoker    Packs/day: 0.00    Years: 26.00    Types: Cigarettes    Last attempt to quit: 05/05/2014  . Smokeless tobacco: Never Used  . Alcohol use No  . Drug use: No  . Sexual activity: Not Currently    Birth control/ protection: Surgical   Other Topics Concern  . None   Social History Narrative  . None   Outpatient Encounter Prescriptions as of 02/29/2016  Medication Sig  . albuterol (PROAIR HFA) 108 (90 BASE) MCG/ACT inhaler Inhale 2 puffs into the lungs every 4 (four) hours as needed for wheezing or shortness of breath.  . ALPRAZolam (XANAX) 1 MG tablet Take 1 mg by mouth 3 (three) times daily as needed for anxiety.   [name] aspirin EC 81 MG tablet Take 81 mg by mouth daily.  . blood glucose meter kit and supplies KIT Dispense based on patient and insurance preference. Use up to four times daily as directed. (FOR ICD 10 E11.65).  . canagliflozin (INVOKANA) 100 MG TABS tablet Take 1 tablet (100 mg total) by mouth daily.  . diclofenac (VOLTAREN) 75 MG EC tablet Take 1 tablet (75 mg total) by mouth 2 (two) times daily with a meal.  . furosemide (LASIX) 20 MG tablet Take 20 mg by mouth 2 (two) times daily as needed.   [name] glucose blood (ACCU-CHEK AVIVA PLUS) test strip USE AS  DIRECTED UP TO FOUR TIMES DAILY.  [name] HYDROcodone-acetaminophen (NORCO) 10-325 MG per tablet Take 1 tablet by mouth every 4 (four) hours as needed for severe pain.  . Insulin Glargine (LANTUS SOLOSTAR [place]) Inject 20 Units into the skin at bedtime.  . Insulin Pen Needle (B-D ULTRAFINE III SHORT PEN) 31G X 8 MM MISC 1 each by Does not apply route as directed.  . loratadine (CLARITIN) 10 MG tablet Take 10 mg by mouth daily.  . Multiple Vitamin (MULTIVITAMIN) tablet Take 1 tablet by mouth daily.  [name] omeprazole (PRILOSEC) 20 MG capsule Take 20 mg by mouth 2 (two) times daily before a meal.   . polyethylene glycol (MIRALAX / GLYCOLAX) packet Take 17 g by mouth daily.  . potassium chloride SA (K-DUR,KLOR-CON) 20 MEQ tablet Take 20 mEq by mouth 2 (two) times daily.   . [DISCONTINUED] canagliflozin (INVOKANA) 300 MG TABS tablet Take 1 tablet (300 mg total) by mouth daily.   No facility-administered encounter medications on file as of 02/29/2016.    ALLERGIES: Allergies  Allergen Reactions  . Beta Adrenergic Blockers     Heart palpitations  . Codeine Other (See Comments)  TYLENOL #3  Welts and itching  . Cymbalta [Duloxetine Hcl]     SOB  . Doxycycline Other (See Comments)    Heart problems  . Lexapro [Escitalopram Oxalate]     Heart palpitations and hives  . Other Palpitations    Allergic to all beta blocker eye drops!!  . Oxycodone Itching   VACCINATION STATUS:  There is no immunization history on file for this patient.  Diabetes  She presents for her follow-up diabetic visit. She has type 2 diabetes mellitus. Onset time: She was diagnosed at approximate age of 12 years. Her disease course has been improving. There are no hypoglycemic associated symptoms. Pertinent negatives for hypoglycemia include no confusion, headaches, pallor or seizures. There are no diabetic associated symptoms. Pertinent negatives for diabetes include no chest pain, no polydipsia, no polyphagia and no polyuria. There  are no hypoglycemic complications. Symptoms are improving. Diabetic complications include retinopathy. Risk factors for coronary artery disease include diabetes mellitus, dyslipidemia and hypertension. Current diabetic treatment includes insulin injections and oral agent (monotherapy). She is compliant with treatment most of the time. Her weight is stable. She is following a generally unhealthy diet. When asked about meal planning, she reported none. She never participates in exercise. There is no change in her home blood glucose trend. Her breakfast blood glucose range is generally 130-140 mg/dl. Her overall blood glucose range is 130-140 mg/dl. Eye exam is current.  Hyperlipidemia  This is a chronic problem. The current episode started more than 1 year ago. Exacerbating diseases include diabetes. Pertinent negatives include no chest pain, myalgias or shortness of breath. Risk factors for coronary artery disease include diabetes mellitus, dyslipidemia, hypertension and a sedentary lifestyle.  Hypertension  This is a chronic problem. The current episode started more than 1 year ago. Pertinent negatives include no chest pain, headaches, palpitations or shortness of breath. Risk factors for coronary artery disease include dyslipidemia, diabetes mellitus, obesity and sedentary lifestyle. Hypertensive end-organ damage includes retinopathy.     Review of Systems  Constitutional: Negative for chills, fever and unexpected weight change.  HENT: Negative for trouble swallowing and voice change.   Eyes: Negative for visual disturbance.  Respiratory: Negative for cough, shortness of breath and wheezing.   Cardiovascular: Negative for chest pain, palpitations and leg swelling.  Gastrointestinal: Negative for diarrhea, nausea and vomiting.  Endocrine: Negative for cold intolerance, heat intolerance, polydipsia, polyphagia and polyuria.  Musculoskeletal: Negative for arthralgias and myalgias.  Skin: Negative for  color change, pallor, rash and wound.  Neurological: Negative for seizures and headaches.  Psychiatric/Behavioral: Negative for confusion and suicidal ideas.    Objective:    BP 114/83   Pulse 67   Ht _0  (1.6 m)   Wt 148 lb (67.1 kg)   BMI 26.22 kg/m   Wt Readings from Last 3 Encounters:  02/29/16 148 lb (67.1 kg)  02/21/16 147 lb (66.7 kg)  01/16/16 145 lb (65.8 kg)    Physical Exam  Constitutional: She is oriented to person, place, and time. She appears well-developed.  HENT:  Head: Normocephalic and atraumatic.  Eyes: EOM are normal.  Neck: Normal range of motion. Neck supple. No tracheal deviation present. No thyromegaly present.  Cardiovascular: Normal rate and regular rhythm.   Pulmonary/Chest: Effort normal and breath sounds normal.  Abdominal: Soft. Bowel sounds are normal. There is no tenderness. There is no guarding.  Musculoskeletal: Normal range of motion. She exhibits no edema.  Neurological: She is alert and oriented to person, place, and  time. She has normal reflexes. No cranial nerve deficit. Coordination normal.  Skin: Skin is warm and dry. No rash noted. No erythema. No pallor.  Psychiatric: She has a normal mood and affect. Judgment normal.    Results for orders placed or performed in visit on 02/21/16  COMPLETE METABOLIC PANEL WITH GFR  Result Value Ref Range   Sodium 139 135 - 146 mmol/L   Potassium 4.5 3.5 - 5.3 mmol/L   Chloride 105 98 - 110 mmol/L   CO2 25 20 - 31 mmol/L   Glucose, Bld 149 (H) 65 - 99 mg/dL   BUN 16 7 - 25 mg/dL   Creat 0.58 0.50 - 1.10 mg/dL   Total Bilirubin 0.3 0.2 - 1.2 mg/dL   Alkaline Phosphatase 42 33 - 115 U/L   AST 12 10 - 30 U/L   ALT 14 6 - 29 U/L   Total Protein 6.4 6.1 - 8.1 g/dL   Albumin 3.9 3.6 - 5.1 g/dL   Calcium 9.4 8.6 - 10.2 mg/dL   GFR, Est African American >89 >=60 mL/min   GFR, Est Non African American >89 >=60 mL/min  Hemoglobin A1c  Result Value Ref Range   Hgb A1c MFr Bld 6.7 (H) <5.7 %    Mean Plasma Glucose 146 mg/dL   Diabetic Labs (most recent): Lab Results  Component Value Date   HGBA1C 6.7 (H) 02/21/2016   HGBA1C 7.6 (H) 08/07/2015   HGBA1C 7.1 (H) 05/14/2015     Assessment & Plan:   1. 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 These are all discussed in detail with the patient.  Patient came with controlled fasting glucose profile, and  recent A1c of  6.7% overall improving from 14.7 %.   Recent labs reviewed.   - I have re-counseled the patient on diet management  by adopting a carbohydrate restricted / protein rich  Diet.  - Suggestion is made for patient to avoid simple carbohydrates   from their diet including Cakes , Desserts, Ice Cream,  Soda (  diet and regular) , Sweet Tea , Candies,  Chips, Cookies, Artificial Sweeteners,   and "Sugar-free" Products .  This will help patient to have stable blood glucose profile and potentially avoid unintended  Weight gain.  - Patient is advised to stick to a routine mealtimes to eat 3 meals  a day and avoid unnecessary snacks ( to snack only to correct hypoglycemia).  - The patient  has been  scheduled with Jearld Fenton, RDN, CDE for individualized DM education.  - I have approached patient with the following individualized plan to manage diabetes and patient agrees.  Her fasting BG readings are all on target, EAG 134.  -Her  Last a1c is better at  6.7%,  overall improved from 14.7%. - I will continue basal insulin Lantus 20 units qhs.  -She will need strict therapy to avoid acute and chronic complications of diabetes.  -she will not need prandial insulin for now.  - Lower Invokana to 134m po qday.   She has no contraindications for use of Invokana at this time. Her foot exam is normal . SE discussed with patient. She does not tolerate metformin.  - Patient specific target  for A1c; LDL, HDL, Triglycerides, and  Waist Circumference were discussed in detail.  2) BP/HTN: Controlled. Continue current medications. 3) Lipids/HPL:  continue statins. 4)  Weight/Diet:  exercise, and carbohydrates information provided.  5) Chronic Care/Health Maintenance:  -Patient is on  Statin medications and encouraged to continue to follow up with Ophthalmology, Podiatrist at least yearly or according to recommendations, and advised to quit smoking. I have recommended yearly flu vaccine and pneumonia vaccination at least every 5 years; moderate intensity exercise for up to 150 minutes weekly; and  sleep for at least 7 hours a day.  - 25 minutes of time was spent on the care of this patient , 50% of which was applied for counseling on diabetes complications and their preventions.  - I advised patient to maintain close follow up with Robert Bellow, MD for primary care needs.  Patient is asked to bring meter and  blood glucose logs during their next visit.   Follow up plan: -Return in about 3 months (around 05/31/2016) for follow up with pre-visit labs, meter, and logs.  Glade Lloyd, MD Phone: [phone]  Fax: [phone]   02/29/2016, 9:36 AM

## 2016-03-06 ENCOUNTER — Ambulatory Visit (INDEPENDENT_AMBULATORY_CARE_PROVIDER_SITE_OTHER): Payer: Medicaid Other | Admitting: Ophthalmology

## 2016-03-06 DIAGNOSIS — E11311 Type 2 diabetes mellitus with unspecified diabetic retinopathy with macular edema: Secondary | ICD-10-CM

## 2016-03-06 DIAGNOSIS — H43813 Vitreous degeneration, bilateral: Secondary | ICD-10-CM

## 2016-03-06 DIAGNOSIS — I10 Essential (primary) hypertension: Secondary | ICD-10-CM | POA: Diagnosis not present

## 2016-03-06 DIAGNOSIS — E103513 Type 1 diabetes mellitus with proliferative diabetic retinopathy with macular edema, bilateral: Secondary | ICD-10-CM

## 2016-03-06 DIAGNOSIS — H35033 Hypertensive retinopathy, bilateral: Secondary | ICD-10-CM

## 2016-03-20 ENCOUNTER — Ambulatory Visit: Payer: Medicaid Other | Admitting: Orthopaedic Surgery

## 2016-03-21 ENCOUNTER — Other Ambulatory Visit: Payer: Self-pay | Admitting: "Endocrinology

## 2016-03-21 ENCOUNTER — Other Ambulatory Visit: Payer: Self-pay

## 2016-03-21 MED ORDER — CANAGLIFLOZIN 100 MG PO TABS: 100.0000 mg | ORAL_TABLET | Freq: Every day | ORAL | 3 refills | Status: DC

## 2016-03-27 ENCOUNTER — Other Ambulatory Visit: Payer: Self-pay | Admitting: "Endocrinology

## 2016-03-27 ENCOUNTER — Telehealth: Payer: Self-pay | Admitting: "Endocrinology

## 2016-03-27 MED ORDER — FLUCONAZOLE 150 MG PO TABS: 150.0000 mg | ORAL_TABLET | Freq: Once | ORAL | 0 refills | Status: AC

## 2016-03-27 NOTE — Telephone Encounter (Signed)
Patient requesting a prescription for Diflucan.[facility] on [address] in [place].

## 2016-04-05 ENCOUNTER — Other Ambulatory Visit: Payer: Self-pay | Admitting: "Endocrinology

## 2016-04-07 MED ORDER — CANAGLIFLOZIN 100 MG PO TABS: 100.0000 mg | ORAL_TABLET | Freq: Every day | ORAL | 3 refills | Status: DC

## 2016-04-09 ENCOUNTER — Encounter (INDEPENDENT_AMBULATORY_CARE_PROVIDER_SITE_OTHER): Payer: Medicaid Other | Admitting: Ophthalmology

## 2016-04-09 DIAGNOSIS — I10 Essential (primary) hypertension: Secondary | ICD-10-CM

## 2016-04-09 DIAGNOSIS — H43813 Vitreous degeneration, bilateral: Secondary | ICD-10-CM

## 2016-04-09 DIAGNOSIS — E103513 Type 1 diabetes mellitus with proliferative diabetic retinopathy with macular edema, bilateral: Secondary | ICD-10-CM | POA: Diagnosis not present

## 2016-04-09 DIAGNOSIS — E10311 Type 1 diabetes mellitus with unspecified diabetic retinopathy with macular edema: Secondary | ICD-10-CM | POA: Diagnosis not present

## 2016-04-09 DIAGNOSIS — H35033 Hypertensive retinopathy, bilateral: Secondary | ICD-10-CM

## 2016-04-15 ENCOUNTER — Encounter (INDEPENDENT_AMBULATORY_CARE_PROVIDER_SITE_OTHER): Payer: Medicaid Other | Admitting: Ophthalmology

## 2016-04-16 ENCOUNTER — Telehealth: Payer: Self-pay | Admitting: Orthopaedic Surgery

## 2016-04-16 NOTE — Telephone Encounter (Signed)
Patient called to inquire about requesting her records for purpose of a second opinion.  Discussed need for signing release form, and also the option of accessing the release form and records through [place].  Sent link to patient to try this option; otherwise, aware to come by our office to sign form for receipt of records.

## 2016-04-29 ENCOUNTER — Telehealth: Payer: Self-pay | Admitting: Orthopaedic Surgery

## 2016-04-29 NOTE — Telephone Encounter (Signed)
Patient had come to our office approximate date 04/24/16 to request medical records.  She signed the release form, which was then processed by [facility], our contracted provider, and forwarded on for release to [facility], as per request.  She called today, 04/29/16, to check status.  I reviewed process of [facility] with patient, and also directly called [facility] for her to verify receipt of request.  Verified that request was in processing as of 04/24/16;  was given the invoice # and the amount due to [facility] for records, $5.23.  Relayed to patient that since she was the requestor, not [employer], she will be billed this amount, but gave her the direct phone # to call and take care of payment of invoice over the phone [phone].  Patient was not pleased with protocol, and expressed dissatisfaction with the process. I reviewed that processes are in place through [facility] in order to be compliant with HIPAA.  Patient then stated she wants to just come in and have [place] hand her a copy of the records, at which time I asked patient to hold for our Environmental education officer, Rosaria Ferries.  Upon speaking with Rosaria Ferries, same information was re-iterated to patient.  Patient voiced understanding although still not pleased with process of record requests.

## 2016-05-09 ENCOUNTER — Other Ambulatory Visit: Payer: Self-pay | Admitting: "Endocrinology

## 2016-05-10 LAB — COMPREHENSIVE METABOLIC PANEL
ALBUMIN: 4.5 g/dL (ref 3.5–5.5)
ALK PHOS: 63 IU/L (ref 39–117)
ALT: 13 IU/L (ref 0–32)
AST: 13 IU/L (ref 0–40)
Albumin/Globulin Ratio: 1.9 (ref 1.2–2.2)
BILIRUBIN TOTAL: 0.4 mg/dL (ref 0.0–1.2)
BUN/Creatinine Ratio: 23 (ref 9–23)
BUN: 15 mg/dL (ref 6–24)
CHLORIDE: 99 mmol/L (ref 96–106)
CO2: 22 mmol/L (ref 18–29)
CREATININE: 0.64 mg/dL (ref 0.57–1.00)
Calcium: 9.7 mg/dL (ref 8.7–10.2)
GFR calc Af Amer: 126 mL/min/{1.73_m2} (ref >59)
GFR calc non Af Amer: 110 mL/min/{1.73_m2} (ref >59)
GLUCOSE: 107 mg/dL — AB (ref 65–99)
Globulin, Total: 2.4 g/dL (ref 1.5–4.5)
Potassium: 4.6 mmol/L (ref 3.5–5.2)
Sodium: 137 mmol/L (ref 134–144)
Total Protein: 6.9 g/dL (ref 6.0–8.5)

## 2016-05-10 LAB — HGB A1C W/O EAG: HEMOGLOBIN A1C: 6.9 % — AB (ref 4.8–5.6)

## 2016-05-28 ENCOUNTER — Encounter (INDEPENDENT_AMBULATORY_CARE_PROVIDER_SITE_OTHER): Payer: Medicaid Other | Admitting: Ophthalmology

## 2016-05-28 DIAGNOSIS — I10 Essential (primary) hypertension: Secondary | ICD-10-CM

## 2016-05-28 DIAGNOSIS — H35033 Hypertensive retinopathy, bilateral: Secondary | ICD-10-CM

## 2016-05-28 DIAGNOSIS — E10311 Type 1 diabetes mellitus with unspecified diabetic retinopathy with macular edema: Secondary | ICD-10-CM | POA: Diagnosis not present

## 2016-05-28 DIAGNOSIS — E103513 Type 1 diabetes mellitus with proliferative diabetic retinopathy with macular edema, bilateral: Secondary | ICD-10-CM | POA: Diagnosis not present

## 2016-05-28 DIAGNOSIS — H43813 Vitreous degeneration, bilateral: Secondary | ICD-10-CM

## 2016-06-06 ENCOUNTER — Ambulatory Visit (INDEPENDENT_AMBULATORY_CARE_PROVIDER_SITE_OTHER): Payer: Medicaid Other | Admitting: "Endocrinology

## 2016-06-06 ENCOUNTER — Encounter: Payer: Self-pay | Admitting: "Endocrinology

## 2016-06-06 VITALS — BP 106/74 | HR 73 | Ht 63.0 in | Wt 144.0 lb

## 2016-06-06 DIAGNOSIS — E11311 Type 2 diabetes mellitus with unspecified diabetic retinopathy with macular edema: Secondary | ICD-10-CM | POA: Diagnosis not present

## 2016-06-06 DIAGNOSIS — I10 Essential (primary) hypertension: Secondary | ICD-10-CM

## 2016-06-06 DIAGNOSIS — E782 Mixed hyperlipidemia: Secondary | ICD-10-CM

## 2016-06-06 DIAGNOSIS — Z794 Long term (current) use of insulin: Secondary | ICD-10-CM

## 2016-06-06 MED ORDER — CANAGLIFLOZIN 100 MG PO TABS: 100.0000 mg | ORAL_TABLET | Freq: Every day | ORAL | 3 refills | Status: DC

## 2016-06-06 MED ORDER — INSULIN GLARGINE 100 UNIT/ML SOLOSTAR PEN: 20.0000 [IU] | PEN_INJECTOR | Freq: Every day | SUBCUTANEOUS | 2 refills | Status: DC

## 2016-06-06 NOTE — Patient Instructions (Signed)

## 2016-06-06 NOTE — Progress Notes (Signed)
Subjective:    Patient ID: Allison [name], female    DOB: 07-Jun-1970, PCP Robert Bellow, MD   Past Medical History:  Diagnosis Date  . Cancer (HCC)    cervical   . Dyslipidemia   . Gastroparesis    possible  . Glaucoma   . Hypertension   . Insulin dependent diabetes mellitus ([place])   . Neuropathy ([place])   . Retinopathy   . 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 ABDOMINAL HYSTERECTOMY     partial   . EYE SURGERY     multiple   Social History   Social History  . Marital status: Divorced    Spouse name: N/A  . Number of children: N/A  . Years of education: N/A   Social History Main Topics  . Smoking status: Current Some Day Smoker    Packs/day: 0.00    Years: 26.00    Types: Cigarettes    Last attempt to quit: 05/05/2014  . Smokeless tobacco: Never Used  . Alcohol use No  . Drug use: No  . Sexual activity: Not Currently    Birth control/ protection: Surgical   Other Topics Concern  . None   Social History Narrative  . None   Outpatient Encounter Prescriptions as of 06/06/2016  Medication Sig  . albuterol (PROAIR HFA) 108 (90 BASE) MCG/ACT inhaler Inhale 2 puffs into the lungs every 4 (four) hours as needed for wheezing or shortness of breath.  . ALPRAZolam (XANAX) 1 MG tablet Take 1 mg by mouth 3 (three) times daily as needed for anxiety.   [name] aspirin EC 81 MG tablet Take 81 mg by mouth daily.  . blood glucose meter kit and supplies KIT Dispense based on patient and insurance preference. Use up to four times daily as directed. (FOR ICD 10 E11.65).  . canagliflozin (INVOKANA) 100 MG TABS tablet Take 1 tablet (100 mg total) by mouth daily.  . diclofenac (VOLTAREN) 75 MG EC tablet Take 1 tablet (75 mg total) by mouth 2 (two) times daily with a meal.  . furosemide (LASIX) 20 MG tablet Take 20 mg by mouth 2 (two) times daily as needed.   [name] glucose blood (ACCU-CHEK AVIVA PLUS) test strip USE AS  DIRECTED UP TO FOUR TIMES DAILY.  [name] HYDROcodone-acetaminophen (NORCO) 10-325 MG per tablet Take 1 tablet by mouth every 4 (four) hours as needed for severe pain.  . Insulin Glargine (LANTUS SOLOSTAR) 100 UNIT/ML Solostar Pen Inject 20 Units into the skin at bedtime.  . Insulin Pen Needle (B-D ULTRAFINE III SHORT PEN) 31G X 8 MM MISC 1 each by Does not apply route as directed.  . loratadine (CLARITIN) 10 MG tablet Take 10 mg by mouth daily.  . Multiple Vitamin (MULTIVITAMIN) tablet Take 1 tablet by mouth daily.  [name] omeprazole (PRILOSEC) 20 MG capsule Take 20 mg by mouth 2 (two) times daily before a meal.   . polyethylene glycol (MIRALAX / GLYCOLAX) packet Take 17 g by mouth daily.  . potassium chloride SA (K-DUR,KLOR-CON) 20 MEQ tablet Take 20 mEq by mouth 2 (two) times daily.   . [DISCONTINUED] canagliflozin (INVOKANA) 100 MG TABS tablet Take 1 tablet (100 mg total) by mouth daily.  . [DISCONTINUED] Insulin Glargine (LANTUS SOLOSTAR [place]) Inject 20 Units into the skin at bedtime.   No facility-administered encounter medications on file as of 06/06/2016.    ALLERGIES: Allergies  Allergen Reactions  .  Beta Adrenergic Blockers     Heart palpitations  . Codeine Other (See Comments)    TYLENOL #3  Welts and itching  . Cymbalta [Duloxetine Hcl]     SOB  . Doxycycline Other (See Comments)    Heart problems  . Lexapro [Escitalopram Oxalate]     Heart palpitations and hives  . Other Palpitations    Allergic to all beta blocker eye drops!!  . Oxycodone Itching   VACCINATION STATUS:  There is no immunization history on file for this patient.  Diabetes  She presents for her follow-up diabetic visit. She has type 2 diabetes mellitus. Onset time: She was diagnosed at approximate age of 81 years. Her disease course has been improving. There are no hypoglycemic associated symptoms. Pertinent negatives for hypoglycemia include no confusion, headaches, pallor or seizures. There are no diabetic  associated symptoms. Pertinent negatives for diabetes include no chest pain, no polydipsia, no polyphagia and no polyuria. There are no hypoglycemic complications. Symptoms are improving. Diabetic complications include retinopathy. Risk factors for coronary artery disease include diabetes mellitus, dyslipidemia and hypertension. Current diabetic treatment includes insulin injections and oral agent (monotherapy). She is compliant with treatment most of the time. Her weight is stable. She is following a generally unhealthy diet. When asked about meal planning, she reported none. She never participates in exercise. There is no change in her home blood glucose trend. Her breakfast blood glucose range is generally 130-140 mg/dl. Her overall blood glucose range is 130-140 mg/dl. Eye exam is current.  Hyperlipidemia  This is a chronic problem. The current episode started more than 1 year ago. Exacerbating diseases include diabetes. Pertinent negatives include no chest pain, myalgias or shortness of breath. Risk factors for coronary artery disease include diabetes mellitus, dyslipidemia, hypertension and a sedentary lifestyle.  Hypertension  This is a chronic problem. The current episode started more than 1 year ago. Pertinent negatives include no chest pain, headaches, palpitations or shortness of breath. Risk factors for coronary artery disease include dyslipidemia, diabetes mellitus, obesity and sedentary lifestyle. Hypertensive end-organ damage includes retinopathy.     Review of Systems  Constitutional: Negative for chills, fever and unexpected weight change.  HENT: Negative for trouble swallowing and voice change.   Eyes: Negative for visual disturbance.  Respiratory: Negative for cough, shortness of breath and wheezing.   Cardiovascular: Negative for chest pain, palpitations and leg swelling.  Gastrointestinal: Negative for diarrhea, nausea and vomiting.  Endocrine: Negative for cold intolerance, heat  intolerance, polydipsia, polyphagia and polyuria.  Musculoskeletal: Negative for arthralgias and myalgias.  Skin: Negative for color change, pallor, rash and wound.  Neurological: Negative for seizures and headaches.  Psychiatric/Behavioral: Negative for confusion and suicidal ideas.    Objective:    BP 106/74   Pulse 73   Ht 5' 3"  (1.6 m)   Wt 144 lb (65.3 kg)   BMI 25.51 kg/m   Wt Readings from Last 3 Encounters:  06/06/16 144 lb (65.3 kg)  02/29/16 148 lb (67.1 kg)  02/21/16 147 lb (66.7 kg)    Physical Exam  Constitutional: She is oriented to person, place, and time. She appears well-developed.  HENT:  Head: Normocephalic and atraumatic.  Eyes: EOM are normal.  Neck: Normal range of motion. Neck supple. No tracheal deviation present. No thyromegaly present.  Cardiovascular: Normal rate and regular rhythm.   Pulmonary/Chest: Effort normal and breath sounds normal.  Abdominal: Soft. Bowel sounds are normal. There is no tenderness. There is no guarding.  Musculoskeletal: Normal  range of motion. She exhibits no edema.  Neurological: She is alert and oriented to person, place, and time. She has normal reflexes. No cranial nerve deficit. Coordination normal.  Skin: Skin is warm and dry. No rash noted. No erythema. No pallor.  Psychiatric: She has a normal mood and affect. Judgment normal.    Results for orders placed or performed in visit on 05/09/16  Comprehensive metabolic panel  Result Value Ref Range   Glucose 107 (H) 65 - 99 mg/dL   BUN 15 6 - 24 mg/dL   Creatinine, Ser 0.64 0.57 - 1.00 mg/dL   GFR calc non Af Amer 110 >59 mL/min/1.73   GFR calc Af Amer 126 >59 mL/min/1.73   BUN/Creatinine Ratio 23 9 - 23   Sodium 137 134 - 144 mmol/L   Potassium 4.6 3.5 - 5.2 mmol/L   Chloride 99 96 - 106 mmol/L   CO2 22 18 - 29 mmol/L   Calcium 9.7 8.7 - 10.2 mg/dL   Total Protein 6.9 6.0 - 8.5 g/dL   Albumin 4.5 3.5 - 5.5 g/dL   Globulin, Total 2.4 1.5 - 4.5 g/dL    Albumin/Globulin Ratio 1.9 1.2 - 2.2   Bilirubin Total 0.4 0.0 - 1.2 mg/dL   Alkaline Phosphatase 63 39 - 117 IU/L   AST 13 0 - 40 IU/L   ALT 13 0 - 32 IU/L  Hgb A1c w/o eAG  Result Value Ref Range   Hgb A1c MFr Bld 6.9 (H) 4.8 - 5.6 %   Diabetic Labs (most recent): Lab Results  Component Value Date   HGBA1C 6.9 (H) 05/09/2016   HGBA1C 6.7 (H) 02/21/2016   HGBA1C 7.6 (H) 08/07/2015     Assessment & Plan:   1. 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 These are all discussed in detail with the patient.  Patient came with controlled fasting glucose profile, and  recent A1c of  6.9% overall improving from 14.7 %.   Recent labs reviewed.   - I have re-counseled the patient on diet management  by adopting a carbohydrate restricted / protein rich  Diet.  - Suggestion is made for patient to avoid simple carbohydrates   from their diet including Cakes , Desserts, Ice Cream,  Soda (  diet and regular) , Sweet Tea , Candies,  Chips, Cookies, Artificial Sweeteners,   and "Sugar-free" Products .  This will help patient to have stable blood glucose profile and potentially avoid unintended  Weight gain.  - Patient is advised to stick to a routine mealtimes to eat 3 meals  a day and avoid unnecessary snacks ( to snack only to correct hypoglycemia).  - The patient  has been  scheduled with Jearld Fenton, RDN, CDE for individualized DM education.  - I have approached patient with the following individualized plan to manage diabetes and patient agrees.  Her fasting BG readings are all on target, EAG 141. -Her  Last a1c is better at  6.9%,  overall improved from 14.7%. - I will  continue basal insulin Lantus 20 units qhs.  -She will need strict therapy to avoid acute and chronic complications of diabetes.  -she will not need prandial insulin for now.  - Continue  Invokana 175m po qday.  She has no contraindications for use of Invokana at this time. Her foot exam is normal . SE discussed with patient. She does not tolerate metformin.  - Patient specific target  for A1c; LDL, HDL, Triglycerides, and  Waist Circumference were discussed in detail.  2) BP/HTN: Controlled. Continue current medications. 3) Lipids/HPL:  continue statins. 4)  Weight/Diet:  exercise, and carbohydrates information provided.  5) Chronic Care/Health Maintenance:  -Patient is on  Statin medications and encouraged to continue to follow up with Ophthalmology, Podiatrist at least yearly or according to recommendations, and advised to quit smoking. I have recommended yearly flu vaccine and pneumonia vaccination at least every 5 years; moderate intensity exercise for up to 150 minutes weekly; and  sleep for at least 7 hours a day.  - 25 minutes of time was spent on the care of this patient , 50% of which was applied for counseling on diabetes complications and their preventions.  - I advised patient to maintain close follow up with SRobert Bellow MD for primary care needs.  Patient is asked to bring meter and  blood glucose logs during their next visit.   Follow up plan: -Return in about 3 months (around 09/04/2016) for follow up with pre-visit labs, meter, and logs.  GGlade Lloyd MD Phone: [phone] Fax: [phone]  06/06/2016, 9:58 AM

## 2016-06-17 ENCOUNTER — Other Ambulatory Visit: Payer: Self-pay | Admitting: "Endocrinology

## 2016-06-26 ENCOUNTER — Encounter (INDEPENDENT_AMBULATORY_CARE_PROVIDER_SITE_OTHER): Payer: Medicaid Other | Admitting: Ophthalmology

## 2016-07-10 ENCOUNTER — Encounter (INDEPENDENT_AMBULATORY_CARE_PROVIDER_SITE_OTHER): Payer: Medicaid Other | Admitting: Ophthalmology

## 2016-07-10 DIAGNOSIS — E103513 Type 1 diabetes mellitus with proliferative diabetic retinopathy with macular edema, bilateral: Secondary | ICD-10-CM | POA: Diagnosis not present

## 2016-07-10 DIAGNOSIS — I10 Essential (primary) hypertension: Secondary | ICD-10-CM | POA: Diagnosis not present

## 2016-07-10 DIAGNOSIS — H35033 Hypertensive retinopathy, bilateral: Secondary | ICD-10-CM

## 2016-07-10 DIAGNOSIS — H43813 Vitreous degeneration, bilateral: Secondary | ICD-10-CM | POA: Diagnosis not present

## 2016-07-11 ENCOUNTER — Encounter (INDEPENDENT_AMBULATORY_CARE_PROVIDER_SITE_OTHER): Payer: Medicaid Other | Admitting: Ophthalmology

## 2016-08-21 ENCOUNTER — Encounter (INDEPENDENT_AMBULATORY_CARE_PROVIDER_SITE_OTHER): Payer: Medicaid Other | Admitting: Ophthalmology

## 2016-08-21 DIAGNOSIS — H35033 Hypertensive retinopathy, bilateral: Secondary | ICD-10-CM

## 2016-08-21 DIAGNOSIS — E103513 Type 1 diabetes mellitus with proliferative diabetic retinopathy with macular edema, bilateral: Secondary | ICD-10-CM | POA: Diagnosis not present

## 2016-08-21 DIAGNOSIS — I10 Essential (primary) hypertension: Secondary | ICD-10-CM | POA: Diagnosis not present

## 2016-08-21 DIAGNOSIS — E10311 Type 1 diabetes mellitus with unspecified diabetic retinopathy with macular edema: Secondary | ICD-10-CM | POA: Diagnosis not present

## 2016-08-21 DIAGNOSIS — H43813 Vitreous degeneration, bilateral: Secondary | ICD-10-CM

## 2016-08-29 LAB — CMP14+EGFR
A/G RATIO: 1.9 (ref 1.2–2.2)
ALBUMIN: 4.4 g/dL (ref 3.5–5.5)
ALT: 17 IU/L (ref 0–32)
AST: 13 IU/L (ref 0–40)
Alkaline Phosphatase: 63 IU/L (ref 39–117)
BILIRUBIN TOTAL: 0.4 mg/dL (ref 0.0–1.2)
BUN/Creatinine Ratio: 22 (ref 9–23)
BUN: 12 mg/dL (ref 6–24)
CHLORIDE: 103 mmol/L (ref 96–106)
CO2: 23 mmol/L (ref 18–29)
Calcium: 9.4 mg/dL (ref 8.7–10.2)
Creatinine, Ser: 0.55 mg/dL — ABNORMAL LOW (ref 0.57–1.00)
GFR, EST AFRICAN AMERICAN: 132 mL/min/{1.73_m2} (ref >59)
GFR, EST NON AFRICAN AMERICAN: 114 mL/min/{1.73_m2} (ref >59)
Globulin, Total: 2.3 g/dL (ref 1.5–4.5)
Glucose: 132 mg/dL — ABNORMAL HIGH (ref 65–99)
POTASSIUM: 4.5 mmol/L (ref 3.5–5.2)
Sodium: 141 mmol/L (ref 134–144)
TOTAL PROTEIN: 6.7 g/dL (ref 6.0–8.5)

## 2016-08-29 LAB — HEMOGLOBIN A1C
ESTIMATED AVERAGE GLUCOSE: 166 mg/dL
Hgb A1c MFr Bld: 7.4 % — ABNORMAL HIGH (ref 4.8–5.6)

## 2016-09-02 ENCOUNTER — Other Ambulatory Visit: Payer: Self-pay | Admitting: "Endocrinology

## 2016-09-04 ENCOUNTER — Ambulatory Visit (INDEPENDENT_AMBULATORY_CARE_PROVIDER_SITE_OTHER): Payer: Medicaid Other | Admitting: "Endocrinology

## 2016-09-04 ENCOUNTER — Encounter: Payer: Self-pay | Admitting: "Endocrinology

## 2016-09-04 ENCOUNTER — Other Ambulatory Visit: Payer: Self-pay | Admitting: "Endocrinology

## 2016-09-04 VITALS — BP 134/86 | HR 80 | Ht 63.0 in | Wt 150.0 lb

## 2016-09-04 DIAGNOSIS — E11311 Type 2 diabetes mellitus with unspecified diabetic retinopathy with macular edema: Secondary | ICD-10-CM

## 2016-09-04 DIAGNOSIS — E782 Mixed hyperlipidemia: Secondary | ICD-10-CM

## 2016-09-04 DIAGNOSIS — I10 Essential (primary) hypertension: Secondary | ICD-10-CM

## 2016-09-04 DIAGNOSIS — Z794 Long term (current) use of insulin: Secondary | ICD-10-CM

## 2016-09-04 DIAGNOSIS — R002 Palpitations: Secondary | ICD-10-CM

## 2016-09-04 MED ORDER — CANAGLIFLOZIN 100 MG PO TABS: 100.0000 mg | ORAL_TABLET | Freq: Every day | ORAL | 3 refills | Status: DC

## 2016-09-04 MED ORDER — INSULIN GLARGINE 100 UNIT/ML SOLOSTAR PEN: 20.0000 [IU] | PEN_INJECTOR | Freq: Every day | SUBCUTANEOUS | 2 refills | Status: DC

## 2016-09-04 NOTE — Progress Notes (Signed)
Subjective:    Patient ID: Allison [name], female    DOB: [birth year], PCP Robert Bellow, MD   Past Medical History:  Diagnosis Date  . Cancer (HCC)    cervical   . Dyslipidemia   . Gastroparesis    possible  . Glaucoma   . Hypertension   . Insulin dependent diabetes mellitus ([place])   . Neuropathy ([place])   . Retinopathy   . 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 ABDOMINAL HYSTERECTOMY     partial   . EYE SURGERY     multiple   Social History   Social History  . Marital status: Divorced    Spouse name: N/A  . Number of children: N/A  . Years of education: N/A   Social History Main Topics  . Smoking status: Current Some Day Smoker    Packs/day: 0.00    Years: 26.00    Types: Cigarettes    Last attempt to quit: 05/05/2014  . Smokeless tobacco: Never Used  . Alcohol use No  . Drug use: No  . Sexual activity: Not Currently    Birth control/ protection: Surgical   Other Topics Concern  . None   Social History Narrative  . None   Outpatient Encounter Prescriptions as of 09/04/2016  Medication Sig  . ACCU-CHEK AVIVA PLUS test strip USE AS DIRECTED UP TO FOUR TIMES DAILY.  [name] albuterol (PROAIR HFA) 108 (90 BASE) MCG/ACT inhaler Inhale 2 puffs into the lungs every 4 (four) hours as needed for wheezing or shortness of breath.  . ALPRAZolam (XANAX) 1 MG tablet Take 1 mg by mouth 3 (three) times daily as needed for anxiety.   [name] aspirin EC 81 MG tablet Take 81 mg by mouth daily.  . blood glucose meter kit and supplies KIT Dispense based on patient and insurance preference. Use up to four times daily as directed. (FOR ICD 10 E11.65).  . canagliflozin (INVOKANA) 100 MG TABS tablet Take 1 tablet (100 mg total) by mouth daily.  . diclofenac (VOLTAREN) 75 MG EC tablet Take 1 tablet (75 mg total) by mouth 2 (two) times daily with a meal.  . furosemide (LASIX) 20 MG tablet Take 20 mg by mouth 2 (two) times daily as  needed.   [name] HYDROcodone-acetaminophen (NORCO) 10-325 MG per tablet Take 1 tablet by mouth every 4 (four) hours as needed for severe pain.  . Insulin Glargine (LANTUS SOLOSTAR) 100 UNIT/ML Solostar Pen Inject 20 Units into the skin daily at 10 pm.  . Insulin Pen Needle (B-D ULTRAFINE III SHORT PEN) 31G X 8 MM MISC 1 each by Does not apply route as directed.  [name] LANTUS SOLOSTAR 100 UNIT/ML Solostar Pen INJECT 20 UNITS INTO THE SKIN DAILY AT 10 PM.  . loratadine (CLARITIN) 10 MG tablet Take 10 mg by mouth daily.  . Multiple Vitamin (MULTIVITAMIN) tablet Take 1 tablet by mouth daily.  [name] omeprazole (PRILOSEC) 20 MG capsule Take 20 mg by mouth 2 (two) times daily before a meal.   . polyethylene glycol (MIRALAX / GLYCOLAX) packet Take 17 g by mouth daily.  . potassium chloride SA (K-DUR,KLOR-CON) 20 MEQ tablet Take 20 mEq by mouth 2 (two) times daily.   . [DISCONTINUED] canagliflozin (INVOKANA) 100 MG TABS tablet Take 1 tablet (100 mg total) by mouth daily.  . [DISCONTINUED] LANTUS SOLOSTAR 100 UNIT/ML Solostar Pen INJECT 20 UNITS INTO THE SKIN DAILY AT 10  PM.   No facility-administered encounter medications on file as of 09/04/2016.    ALLERGIES: Allergies  Allergen Reactions  . Beta Adrenergic Blockers     Heart palpitations  . Codeine Other (See Comments)    TYLENOL #3  Welts and itching  . Cymbalta [Duloxetine Hcl]     SOB  . Doxycycline Other (See Comments)    Heart problems  . Lexapro [Escitalopram Oxalate]     Heart palpitations and hives  . Other Palpitations    Allergic to all beta blocker eye drops!!  . Oxycodone Itching   VACCINATION STATUS:  There is no immunization history on file for this patient.  Diabetes  She presents for her follow-up diabetic visit. She has type 2 diabetes mellitus. Onset time: She was diagnosed at approximate age of 69 years. Her disease course has been improving. There are no hypoglycemic associated symptoms. Pertinent negatives for hypoglycemia  include no confusion, headaches, pallor or seizures. There are no diabetic associated symptoms. Pertinent negatives for diabetes include no chest pain, no polydipsia, no polyphagia and no polyuria. There are no hypoglycemic complications. Symptoms are improving. Diabetic complications include retinopathy. Risk factors for coronary artery disease include diabetes mellitus, dyslipidemia and hypertension. Current diabetic treatment includes insulin injections and oral agent (monotherapy). She is compliant with treatment most of the time. Her weight is stable. She is following a generally unhealthy diet. When asked about meal planning, she reported none. She never participates in exercise. There is no change in her home blood glucose trend. Her breakfast blood glucose range is generally 130-140 mg/dl. Her overall blood glucose range is 130-140 mg/dl. Eye exam is current.  Hyperlipidemia  This is a chronic problem. The current episode started more than 1 year ago. Exacerbating diseases include diabetes. Pertinent negatives include no chest pain, myalgias or shortness of breath. Risk factors for coronary artery disease include diabetes mellitus, dyslipidemia, hypertension and a sedentary lifestyle.  Hypertension  This is a chronic problem. The current episode started more than 1 year ago. Pertinent negatives include no chest pain, headaches, palpitations or shortness of breath. Risk factors for coronary artery disease include dyslipidemia, diabetes mellitus, obesity and sedentary lifestyle. Hypertensive end-organ damage includes retinopathy.     Review of Systems  Constitutional: Negative for chills, fever and unexpected weight change.  HENT: Negative for trouble swallowing and voice change.   Eyes: Negative for visual disturbance.  Respiratory: Negative for cough, shortness of breath and wheezing.   Cardiovascular: Negative for chest pain, palpitations and leg swelling.  Gastrointestinal: Negative for  diarrhea, nausea and vomiting.  Endocrine: Negative for cold intolerance, heat intolerance, polydipsia, polyphagia and polyuria.  Musculoskeletal: Negative for arthralgias and myalgias.  Skin: Negative for color change, pallor, rash and wound.  Neurological: Negative for seizures and headaches.  Psychiatric/Behavioral: Negative for confusion and suicidal ideas.    Objective:    BP 134/86   Pulse 80   Ht 5' 3"  (1.6 m)   Wt 150 lb (68 kg)   BMI 26.57 kg/m   Wt Readings from Last 3 Encounters:  09/04/16 150 lb (68 kg)  06/06/16 144 lb (65.3 kg)  02/29/16 148 lb (67.1 kg)    Physical Exam  Constitutional: She is oriented to person, place, and time. She appears well-developed.  HENT:  Head: Normocephalic and atraumatic.  Eyes: EOM are normal.  Neck: Normal range of motion. Neck supple. No tracheal deviation present. No thyromegaly present.  Cardiovascular: Normal rate and regular rhythm.   Pulmonary/Chest: Effort normal  and breath sounds normal.  Abdominal: Soft. Bowel sounds are normal. There is no tenderness. There is no guarding.  Musculoskeletal: Normal range of motion. She exhibits no edema.  Neurological: She is alert and oriented to person, place, and time. She has normal reflexes. No cranial nerve deficit. Coordination normal.  Skin: Skin is warm and dry. No rash noted. No erythema. No pallor.  Psychiatric: She has a normal mood and affect. Judgment normal.   Results for orders placed or performed in visit on 06/06/16  Hemoglobin A1c  Result Value Ref Range   Hgb A1c MFr Bld 7.4 (H) 4.8 - 5.6 %   Est. average glucose Bld gHb Est-mCnc 166 mg/dL  CMP14+EGFR  Result Value Ref Range   Glucose 132 (H) 65 - 99 mg/dL   BUN 12 6 - 24 mg/dL   Creatinine, Ser 0.55 (L) 0.57 - 1.00 mg/dL   GFR calc non Af Amer 114 >59 mL/min/1.73   GFR calc Af Amer 132 >59 mL/min/1.73   BUN/Creatinine Ratio 22 9 - 23   Sodium 141 134 - 144 mmol/L   Potassium 4.5 3.5 - 5.2 mmol/L   Chloride  103 96 - 106 mmol/L   CO2 23 18 - 29 mmol/L   Calcium 9.4 8.7 - 10.2 mg/dL   Total Protein 6.7 6.0 - 8.5 g/dL   Albumin 4.4 3.5 - 5.5 g/dL   Globulin, Total 2.3 1.5 - 4.5 g/dL   Albumin/Globulin Ratio 1.9 1.2 - 2.2   Bilirubin Total 0.4 0.0 - 1.2 mg/dL   Alkaline Phosphatase 63 39 - 117 IU/L   AST 13 0 - 40 IU/L   ALT 17 0 - 32 IU/L   Diabetic Labs (most recent): Lab Results  Component Value Date   HGBA1C 7.4 (H) 08/28/2016   HGBA1C 6.9 (H) 05/09/2016   HGBA1C 6.7 (H) 02/21/2016     Assessment & Plan:   1. 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 These are all discussed in detail with the patient.  Patient came with controlled fasting glucose profile, and  recent A1c Increasing to 7.4% from 6.9% , after overall improving from 14.7 %.   Recent labs reviewed.   - I have re-counseled the patient on diet management  by adopting a carbohydrate restricted / protein rich  Diet.  - Suggestion is made for patient to avoid simple carbohydrates   from their diet including Cakes , Desserts, Ice Cream,  Soda (  diet and regular) , Sweet Tea , Candies,  Chips, Cookies, Artificial Sweeteners,   and "Sugar-free" Products .  This will help patient to have stable blood glucose profile and potentially avoid unintended  Weight gain.  - Patient is advised to stick to a routine mealtimes to eat 3 meals  a day and avoid unnecessary snacks ( to snack only to correct hypoglycemia).  - The patient  has been  scheduled with Jearld Fenton, RDN, CDE for individualized DM education.  - I have approached patient with the following individualized plan to manage diabetes and patient agrees.  -  I will continue basal insulin Lantus 20 units qhs.  -She will need strict therapy to avoid acute and chronic complications of diabetes.  -she will not  need prandial insulin for now.  - Continue Invokana 15m po qday.  She has no contraindications for use of Invokana at this time. Her foot exam is normal . SE discussed with patient. She does not tolerate metformin.  - Patient specific target  for A1c; LDL, HDL, Triglycerides, and  Waist Circumference were discussed in detail.  2) BP/HTN: Controlled. Continue current medications. 3) Lipids/HPL:  continue statins. 4)  Weight/Diet:  exercise, and carbohydrates information provided.  5) Chronic Care/Health Maintenance:  -Patient is on  Statin medications and encouraged to continue to follow up with Ophthalmology, Podiatrist at least yearly or according to recommendations, and advised to quit smoking. I have recommended yearly flu vaccine and pneumonia vaccination at least every 5 years; moderate intensity exercise for up to 150 minutes weekly; and  sleep for at least 7 hours a day.  - 25 minutes of time was spent on the care of this patient , 50% of which was applied for counseling on diabetes complications and their preventions.  - I advised patient to maintain close follow up with SRobert Bellow MD for primary care needs.  Patient is asked to bring meter and  blood glucose logs during their next visit.   Follow up plan: -Return in about 3 months (around 12/04/2016) for follow up with pre-visit labs, meter, and logs.  GGlade Lloyd MD Phone: [phone] Fax: [phone]  09/04/2016, 9:53 AM

## 2016-09-04 NOTE — Patient Instructions (Signed)

## 2016-09-10 ENCOUNTER — Encounter (INDEPENDENT_AMBULATORY_CARE_PROVIDER_SITE_OTHER): Payer: Medicaid Other | Admitting: Ophthalmology

## 2016-09-10 DIAGNOSIS — I10 Essential (primary) hypertension: Secondary | ICD-10-CM

## 2016-09-10 DIAGNOSIS — E10311 Type 1 diabetes mellitus with unspecified diabetic retinopathy with macular edema: Secondary | ICD-10-CM | POA: Diagnosis not present

## 2016-09-10 DIAGNOSIS — H35033 Hypertensive retinopathy, bilateral: Secondary | ICD-10-CM | POA: Diagnosis not present

## 2016-09-10 DIAGNOSIS — E103513 Type 1 diabetes mellitus with proliferative diabetic retinopathy with macular edema, bilateral: Secondary | ICD-10-CM

## 2016-09-10 DIAGNOSIS — H43813 Vitreous degeneration, bilateral: Secondary | ICD-10-CM | POA: Diagnosis not present

## 2016-09-25 ENCOUNTER — Other Ambulatory Visit: Payer: Medicaid Other | Admitting: Obstetrics and Gynecology

## 2016-09-26 ENCOUNTER — Encounter (INDEPENDENT_AMBULATORY_CARE_PROVIDER_SITE_OTHER): Payer: Medicaid Other | Admitting: Ophthalmology

## 2016-10-09 ENCOUNTER — Telehealth: Payer: Self-pay | Admitting: *Deleted

## 2016-10-09 ENCOUNTER — Encounter: Payer: Self-pay | Admitting: Obstetrics and Gynecology

## 2016-10-09 ENCOUNTER — Ambulatory Visit (INDEPENDENT_AMBULATORY_CARE_PROVIDER_SITE_OTHER): Payer: Medicaid Other | Admitting: Obstetrics and Gynecology

## 2016-10-09 VITALS — BP 132/82 | HR 80 | Ht 63.5 in | Wt 149.0 lb

## 2016-10-09 DIAGNOSIS — N898 Other specified noninflammatory disorders of vagina: Secondary | ICD-10-CM

## 2016-10-09 DIAGNOSIS — Z01419 Encounter for gynecological examination (general) (routine) without abnormal findings: Secondary | ICD-10-CM

## 2016-10-09 DIAGNOSIS — Z0000 Encounter for general adult medical examination without abnormal findings: Secondary | ICD-10-CM | POA: Diagnosis not present

## 2016-10-09 DIAGNOSIS — N951 Menopausal and female climacteric states: Secondary | ICD-10-CM

## 2016-10-09 DIAGNOSIS — Z113 Encounter for screening for infections with a predominantly sexual mode of transmission: Secondary | ICD-10-CM

## 2016-10-09 LAB — POCT WET PREP (WET MOUNT): Trichomonas Wet Prep HPF POC: ABSENT

## 2016-10-09 MED ORDER — ESTROGENS, CONJUGATED 0.625 MG/GM VA CREA: 0.5000 g | TOPICAL_CREAM | VAGINAL | 12 refills | Status: DC

## 2016-10-09 MED ORDER — METRONIDAZOLE 500 MG PO TABS: 500.0000 mg | ORAL_TABLET | Freq: Two times a day (BID) | ORAL | 0 refills | Status: DC

## 2016-10-09 NOTE — Telephone Encounter (Signed)
Informed patient prescription was approved.
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
## 2016-10-11 LAB — GC/CHLAMYDIA PROBE AMP
CHLAMYDIA, DNA PROBE: NEGATIVE
Neisseria gonorrhoeae by PCR: NEGATIVE

## 2016-10-14 LAB — FOLLICLE STIMULATING HORMONE: FSH: 7.5 m[IU]/mL

## 2016-10-22 ENCOUNTER — Encounter (INDEPENDENT_AMBULATORY_CARE_PROVIDER_SITE_OTHER): Payer: Medicaid Other | Admitting: Ophthalmology

## 2016-10-22 DIAGNOSIS — E103592 Type 1 diabetes mellitus with proliferative diabetic retinopathy without macular edema, left eye: Secondary | ICD-10-CM

## 2016-10-22 DIAGNOSIS — E103511 Type 1 diabetes mellitus with proliferative diabetic retinopathy with macular edema, right eye: Secondary | ICD-10-CM | POA: Diagnosis not present

## 2016-10-22 DIAGNOSIS — H35033 Hypertensive retinopathy, bilateral: Secondary | ICD-10-CM

## 2016-10-22 DIAGNOSIS — H43813 Vitreous degeneration, bilateral: Secondary | ICD-10-CM | POA: Diagnosis not present

## 2016-10-22 DIAGNOSIS — I10 Essential (primary) hypertension: Secondary | ICD-10-CM

## 2016-10-22 DIAGNOSIS — E10311 Type 1 diabetes mellitus with unspecified diabetic retinopathy with macular edema: Secondary | ICD-10-CM

## 2016-12-03 ENCOUNTER — Other Ambulatory Visit: Payer: Self-pay | Admitting: "Endocrinology

## 2016-12-03 ENCOUNTER — Encounter (INDEPENDENT_AMBULATORY_CARE_PROVIDER_SITE_OTHER): Payer: Medicaid Other | Admitting: Ophthalmology

## 2016-12-03 ENCOUNTER — Telehealth: Payer: Self-pay | Admitting: "Endocrinology

## 2016-12-03 MED ORDER — CANAGLIFLOZIN 100 MG PO TABS: 100.0000 mg | ORAL_TABLET | Freq: Every day | ORAL | 0 refills | Status: DC

## 2016-12-03 NOTE — Telephone Encounter (Signed)
Allison [name] is calling asking for a refill on canagliflozin (INVOKANA) 100 MG TABS tablet until her next visit, please advise?

## 2016-12-03 NOTE — Telephone Encounter (Signed)
Rx sent 

## 2016-12-05 ENCOUNTER — Ambulatory Visit: Payer: Medicaid Other | Admitting: "Endocrinology

## 2016-12-05 ENCOUNTER — Encounter (INDEPENDENT_AMBULATORY_CARE_PROVIDER_SITE_OTHER): Payer: Medicaid Other | Admitting: Ophthalmology

## 2016-12-05 DIAGNOSIS — E103512 Type 1 diabetes mellitus with proliferative diabetic retinopathy with macular edema, left eye: Secondary | ICD-10-CM | POA: Diagnosis not present

## 2016-12-05 DIAGNOSIS — E10311 Type 1 diabetes mellitus with unspecified diabetic retinopathy with macular edema: Secondary | ICD-10-CM | POA: Diagnosis not present

## 2016-12-05 DIAGNOSIS — H35033 Hypertensive retinopathy, bilateral: Secondary | ICD-10-CM

## 2016-12-05 DIAGNOSIS — I10 Essential (primary) hypertension: Secondary | ICD-10-CM

## 2016-12-05 DIAGNOSIS — H43813 Vitreous degeneration, bilateral: Secondary | ICD-10-CM

## 2016-12-05 DIAGNOSIS — E103591 Type 1 diabetes mellitus with proliferative diabetic retinopathy without macular edema, right eye: Secondary | ICD-10-CM

## 2016-12-17 ENCOUNTER — Other Ambulatory Visit: Payer: Self-pay | Admitting: "Endocrinology

## 2016-12-18 ENCOUNTER — Encounter (INDEPENDENT_AMBULATORY_CARE_PROVIDER_SITE_OTHER): Payer: Medicaid Other | Admitting: Ophthalmology

## 2016-12-18 DIAGNOSIS — H43813 Vitreous degeneration, bilateral: Secondary | ICD-10-CM | POA: Diagnosis not present

## 2016-12-18 DIAGNOSIS — E103592 Type 1 diabetes mellitus with proliferative diabetic retinopathy without macular edema, left eye: Secondary | ICD-10-CM | POA: Diagnosis not present

## 2016-12-18 DIAGNOSIS — E103511 Type 1 diabetes mellitus with proliferative diabetic retinopathy with macular edema, right eye: Secondary | ICD-10-CM

## 2016-12-18 DIAGNOSIS — I10 Essential (primary) hypertension: Secondary | ICD-10-CM | POA: Diagnosis not present

## 2016-12-18 DIAGNOSIS — H35033 Hypertensive retinopathy, bilateral: Secondary | ICD-10-CM | POA: Diagnosis not present

## 2016-12-18 DIAGNOSIS — E10311 Type 1 diabetes mellitus with unspecified diabetic retinopathy with macular edema: Secondary | ICD-10-CM | POA: Diagnosis not present

## 2016-12-18 LAB — COMPREHENSIVE METABOLIC PANEL
A/G RATIO: 1.6 (ref 1.2–2.2)
ALT: 13 IU/L (ref 0–32)
AST: 15 IU/L (ref 0–40)
Albumin: 4.4 g/dL (ref 3.5–5.5)
Alkaline Phosphatase: 72 IU/L (ref 39–117)
BUN/Creatinine Ratio: 22 (ref 9–23)
BUN: 15 mg/dL (ref 6–24)
Bilirubin Total: 0.3 mg/dL (ref 0.0–1.2)
CHLORIDE: 103 mmol/L (ref 96–106)
CO2: 21 mmol/L (ref 20–29)
Calcium: 9.6 mg/dL (ref 8.7–10.2)
Creatinine, Ser: 0.68 mg/dL (ref 0.57–1.00)
GFR calc non Af Amer: 107 mL/min/{1.73_m2} (ref >59)
GFR, EST AFRICAN AMERICAN: 123 mL/min/{1.73_m2} (ref >59)
Globulin, Total: 2.7 g/dL (ref 1.5–4.5)
Glucose: 122 mg/dL — ABNORMAL HIGH (ref 65–99)
POTASSIUM: 4.7 mmol/L (ref 3.5–5.2)
Sodium: 137 mmol/L (ref 134–144)
TOTAL PROTEIN: 7.1 g/dL (ref 6.0–8.5)

## 2016-12-18 LAB — TSH: TSH: 1.34 u[IU]/mL (ref 0.450–4.500)

## 2016-12-18 LAB — T4, FREE: Free T4: 1.17 ng/dL (ref 0.82–1.77)

## 2016-12-18 LAB — HGB A1C W/O EAG: Hgb A1c MFr Bld: 7.3 % — ABNORMAL HIGH (ref 4.8–5.6)

## 2016-12-29 ENCOUNTER — Encounter: Payer: Self-pay | Admitting: "Endocrinology

## 2016-12-29 ENCOUNTER — Ambulatory Visit (INDEPENDENT_AMBULATORY_CARE_PROVIDER_SITE_OTHER): Payer: Medicaid Other | Admitting: "Endocrinology

## 2016-12-29 VITALS — BP 117/78 | HR 80 | Wt 150.0 lb

## 2016-12-29 DIAGNOSIS — I10 Essential (primary) hypertension: Secondary | ICD-10-CM | POA: Diagnosis not present

## 2016-12-29 DIAGNOSIS — E1142 Type 2 diabetes mellitus with diabetic polyneuropathy: Secondary | ICD-10-CM | POA: Diagnosis not present

## 2016-12-29 DIAGNOSIS — E782 Mixed hyperlipidemia: Secondary | ICD-10-CM | POA: Diagnosis not present

## 2016-12-29 DIAGNOSIS — E11311 Type 2 diabetes mellitus with unspecified diabetic retinopathy with macular edema: Secondary | ICD-10-CM | POA: Diagnosis not present

## 2016-12-29 DIAGNOSIS — Z794 Long term (current) use of insulin: Secondary | ICD-10-CM | POA: Diagnosis not present

## 2016-12-29 MED ORDER — INSULIN GLARGINE 100 UNIT/ML SOLOSTAR PEN: 20.0000 [IU] | PEN_INJECTOR | Freq: Every day | SUBCUTANEOUS | 2 refills | Status: DC

## 2016-12-29 MED ORDER — GLUCOSE BLOOD VI STRP: ORAL_STRIP | 5 refills | Status: DC

## 2016-12-29 MED ORDER — CANAGLIFLOZIN 100 MG PO TABS: 100.0000 mg | ORAL_TABLET | Freq: Every day | ORAL | 1 refills | Status: DC

## 2016-12-29 NOTE — Progress Notes (Signed)
Subjective:    Patient ID: Allison [name], female    DOB: 1971-03-03, PCP [name], MD   Past Medical History:  Diagnosis Date  . Cancer (HCC)    cervical   . Dyslipidemia   . Gastroparesis    possible  . Glaucoma   . Hypertension   . Insulin dependent diabetes mellitus ([place])   . Neuropathy   . Retinopathy   . 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 ABDOMINAL HYSTERECTOMY     partial   . EYE SURGERY     multiple   Social History   Social History  . Marital status: Divorced    Spouse name: N/A  . Number of children: N/A  . Years of education: N/A   Social History Main Topics  . Smoking status: Current Some Day Smoker    Packs/day: 0.25    Years: 26.00    Types: Cigarettes    Last attempt to quit: 05/05/2014  . Smokeless tobacco: Never Used  . Alcohol use No  . Drug use: No  . Sexual activity: Yes    Birth control/ protection: Surgical   Other Topics Concern  . None   Social History Narrative  . None   Outpatient Encounter Prescriptions as of 12/29/2016  Medication Sig  . albuterol (PROAIR HFA) 108 (90 BASE) MCG/ACT inhaler Inhale 2 puffs into the lungs every 4 (four) hours as needed for wheezing or shortness of breath.  . ALPRAZolam (XANAX) 1 MG tablet Take 1 mg by mouth 3 (three) times daily as needed for anxiety.   [name] aspirin EC 81 MG tablet Take 81 mg by mouth daily.  . blood glucose meter kit and supplies KIT Dispense based on patient and insurance preference. Use up to four times daily as directed. (FOR ICD 10 E11.65).  . canagliflozin (INVOKANA) 100 MG TABS tablet Take 1 tablet (100 mg total) by mouth daily.  [name] conjugated estrogens (PREMARIN) vaginal cream Place 4.25 Applicatorfuls vaginally 3 (three) times a week.  . furosemide (LASIX) 20 MG tablet Take 20 mg by mouth 2 (two) times daily as needed.   [name] glucose blood (ACCU-CHEK AVIVA PLUS) test strip USE AS DIRECTED UP TO FOUR TIMES  DAILY.  [name] HYDROcodone-acetaminophen (NORCO) 10-325 MG per tablet Take 1 tablet by mouth every 4 (four) hours as needed for severe pain.  . Insulin Glargine (LANTUS SOLOSTAR) 100 UNIT/ML Solostar Pen Inject 20 Units into the skin daily at 10 pm.  . Insulin Pen Needle (B-D ULTRAFINE III SHORT PEN) 31G X 8 MM MISC 1 each by Does not apply route as directed.  . loratadine (CLARITIN) 10 MG tablet Take 10 mg by mouth daily.  . metroNIDAZOLE (FLAGYL) 500 MG tablet Take 1 tablet (500 mg total) by mouth 2 (two) times daily.  . Multiple Vitamin (MULTIVITAMIN) tablet Take 1 tablet by mouth daily.  [name] omeprazole (PRILOSEC) 20 MG capsule Take 20 mg by mouth 2 (two) times daily before a meal.   . polyethylene glycol (MIRALAX / GLYCOLAX) packet Take 17 g by mouth daily.  . potassium chloride SA (K-DUR,KLOR-CON) 20 MEQ tablet Take 20 mEq by mouth 2 (two) times daily.   . [DISCONTINUED] ACCU-CHEK AVIVA PLUS test strip USE AS DIRECTED UP TO FOUR TIMES DAILY.  . [DISCONTINUED] canagliflozin (INVOKANA) 100 MG TABS tablet Take 1 tablet (100 mg total) by mouth daily.  . [DISCONTINUED] Insulin Glargine (LANTUS SOLOSTAR) 100 UNIT/ML  Solostar Pen Inject 20 Units into the skin daily at 10 pm.   No facility-administered encounter medications on file as of 12/29/2016.    ALLERGIES: Allergies  Allergen Reactions  . Beta Adrenergic Blockers     Heart palpitations  . Codeine Other (See Comments)    TYLENOL #3  Welts and itching  . Cymbalta [Duloxetine Hcl]     SOB  . Doxycycline Other (See Comments)    Heart problems  . Lexapro [Escitalopram Oxalate]     Heart palpitations and hives  . Other Palpitations    Allergic to all beta blocker eye drops!!  . Oxycodone Itching   VACCINATION STATUS:  There is no immunization history on file for this patient.  Diabetes  She presents for her follow-up diabetic visit. She has type 2 diabetes mellitus. Onset time: She was diagnosed at approximate age of 75 years. Her disease  course has been stable. There are no hypoglycemic associated symptoms. Pertinent negatives for hypoglycemia include no confusion, headaches, pallor or seizures. There are no diabetic associated symptoms. Pertinent negatives for diabetes include no chest pain, no polydipsia, no polyphagia and no polyuria. There are no hypoglycemic complications. Symptoms are stable. Diabetic complications include retinopathy. Risk factors for coronary artery disease include diabetes mellitus, dyslipidemia and hypertension. Current diabetic treatment includes insulin injections and oral agent (monotherapy). She is compliant with treatment most of the time. Her weight is stable. She is following a generally unhealthy diet. When asked about meal planning, she reported none. She never participates in exercise. There is no change in her home blood glucose trend. Her breakfast blood glucose range is generally 130-140 mg/dl. Her overall blood glucose range is 130-140 mg/dl. Eye exam is current.  Hyperlipidemia  This is a chronic problem. The current episode started more than 1 year ago. Exacerbating diseases include diabetes. Pertinent negatives include no chest pain, myalgias or shortness of breath. Risk factors for coronary artery disease include diabetes mellitus, dyslipidemia, hypertension and a sedentary lifestyle.  Hypertension  This is a chronic problem. The current episode started more than 1 year ago. Pertinent negatives include no chest pain, headaches, palpitations or shortness of breath. Risk factors for coronary artery disease include dyslipidemia, diabetes mellitus, obesity and sedentary lifestyle. Hypertensive end-organ damage includes retinopathy.     Review of Systems  Constitutional: Negative for chills, fever and unexpected weight change.  HENT: Negative for trouble swallowing and voice change.   Eyes: Negative for visual disturbance.  Respiratory: Negative for cough, shortness of breath and wheezing.    Cardiovascular: Negative for chest pain, palpitations and leg swelling.  Gastrointestinal: Negative for diarrhea, nausea and vomiting.  Endocrine: Negative for cold intolerance, heat intolerance, polydipsia, polyphagia and polyuria.  Musculoskeletal: Negative for arthralgias and myalgias.  Skin: Negative for color change, pallor, rash and wound.  Neurological: Negative for seizures and headaches.  Psychiatric/Behavioral: Negative for confusion and suicidal ideas.    Objective:    BP 117/78   Pulse 80   Wt 150 lb (68 kg)   SpO2 100%   BMI 26.15 kg/m   Wt Readings from Last 3 Encounters:  12/29/16 150 lb (68 kg)  10/09/16 149 lb (67.6 kg)  09/04/16 150 lb (68 kg)    Physical Exam  Constitutional: She is oriented to person, place, and time. She appears well-developed.  HENT:  Head: Normocephalic and atraumatic.  Eyes: EOM are normal.  Neck: Normal range of motion. Neck supple. No tracheal deviation present. No thyromegaly present.  Cardiovascular: Normal rate  and regular rhythm.   Pulmonary/Chest: Effort normal and breath sounds normal.  Abdominal: Soft. Bowel sounds are normal. There is no tenderness. There is no guarding.  Musculoskeletal: Normal range of motion. She exhibits no edema.  Neurological: She is alert and oriented to person, place, and time. She has normal reflexes. No cranial nerve deficit. Coordination normal.  Skin: Skin is warm and dry. No rash noted. No erythema. No pallor.  Psychiatric: She has a normal mood and affect. Judgment normal.   Results for orders placed or performed in visit on 12/17/16  Comprehensive metabolic panel  Result Value Ref Range   Glucose 122 (H) 65 - 99 mg/dL   BUN 15 6 - 24 mg/dL   Creatinine, Ser 0.68 0.57 - 1.00 mg/dL   GFR calc non Af Amer 107 >59 mL/min/1.73   GFR calc Af Amer 123 >59 mL/min/1.73   BUN/Creatinine Ratio 22 9 - 23   Sodium 137 134 - 144 mmol/L   Potassium 4.7 3.5 - 5.2 mmol/L   Chloride 103 96 - 106 mmol/L    CO2 21 20 - 29 mmol/L   Calcium 9.6 8.7 - 10.2 mg/dL   Total Protein 7.1 6.0 - 8.5 g/dL   Albumin 4.4 3.5 - 5.5 g/dL   Globulin, Total 2.7 1.5 - 4.5 g/dL   Albumin/Globulin Ratio 1.6 1.2 - 2.2   Bilirubin Total 0.3 0.0 - 1.2 mg/dL   Alkaline Phosphatase 72 39 - 117 IU/L   AST 15 0 - 40 IU/L   ALT 13 0 - 32 IU/L  Hgb A1c w/o eAG  Result Value Ref Range   Hgb A1c MFr Bld 7.3 (H) 4.8 - 5.6 %  T4, free  Result Value Ref Range   Free T4 1.17 0.82 - 1.77 ng/dL  TSH  Result Value Ref Range   TSH 1.340 0.450 - 4.500 uIU/mL   Diabetic Labs (most recent): Lab Results  Component Value Date   HGBA1C 7.3 (H) 12/17/2016   HGBA1C 7.4 (H) 08/28/2016   HGBA1C 6.9 (H) 05/09/2016     Assessment & Plan:   1. 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 These are all discussed in detail with the patient.  Patient came with controlled fasting glucose profile, and  recent A1c  Staying same at 7.3% ,  after overall improving from 14.7 %.   Recent labs reviewed.   - I have re-counseled the patient on diet management  by adopting a carbohydrate restricted / protein rich  Diet.  - Suggestion is made for patient to avoid simple carbohydrates   from her diet including Cakes , Desserts, Ice Cream,  Soda (  diet and regular) , Sweet Tea , Candies,  Chips, Cookies, Artificial Sweeteners,   and "Sugar-free" Products .  This will help patient to have stable blood glucose profile and potentially avoid unintended  Weight gain.  - Patient is advised to stick to a routine mealtimes to eat 3 meals  a day and avoid unnecessary snacks ( to snack only to correct hypoglycemia).  - I have approached  patient with the following individualized plan to manage diabetes and patient agrees.  - I will continue basal insulin Lantus 20 units daily at bedtime.  -  She will need strict therapy to avoid acute and chronic complications of diabetes.  -she will not need prandial insulin for now.  - Continue Invokana 135m po qday.  She has no contraindications for use of Invokana at this time. Her foot exam is normal . SE discussed with patient. She does not tolerate metformin.  - Patient specific target  for A1c; LDL, HDL, Triglycerides, and  Waist Circumference were discussed in detail.  2) BP/HTN: Controlled. Continue current medications. 3) Lipids/HPL:  continue statins. 4)  Weight/Diet:  exercise, and carbohydrates information provided.  5) Chronic Care/Health Maintenance:  -Patient is on  Statin medications and encouraged to continue to follow up with Ophthalmology, Podiatrist at least yearly or according to recommendations, and advised to quit smoking. I have recommended yearly flu vaccine and pneumonia vaccination at least every 5 years; moderate intensity exercise for up to 150 minutes weekly; and  sleep for at least 7 hours a day.  - 20 minutes of time was spent on the care of this patient , 50% of which was applied for counseling on diabetes complications and their preventions.  - I advised patient to maintain close follow up with KLemmie Evens MD for primary care needs.  Patient is asked to bring meter and  blood glucose logs during her next visit.   Follow up plan: -Return in about 3 months (around 03/31/2017) for meter, and logs.  GGlade Lloyd MD Phone: [phone] Fax: [phone]  12/29/2016, 9:29 AM

## 2016-12-29 NOTE — Patient Instructions (Signed)

## 2017-01-10 IMAGING — DX DG CHEST 2V
2 series · 2 of 2 positions shown · non-contrast
Comparison: 11/19/2009

CLINICAL DATA: Chest pain, quit smoking 7 months ago

EXAM:
CHEST  2 VIEW

[chest pa]
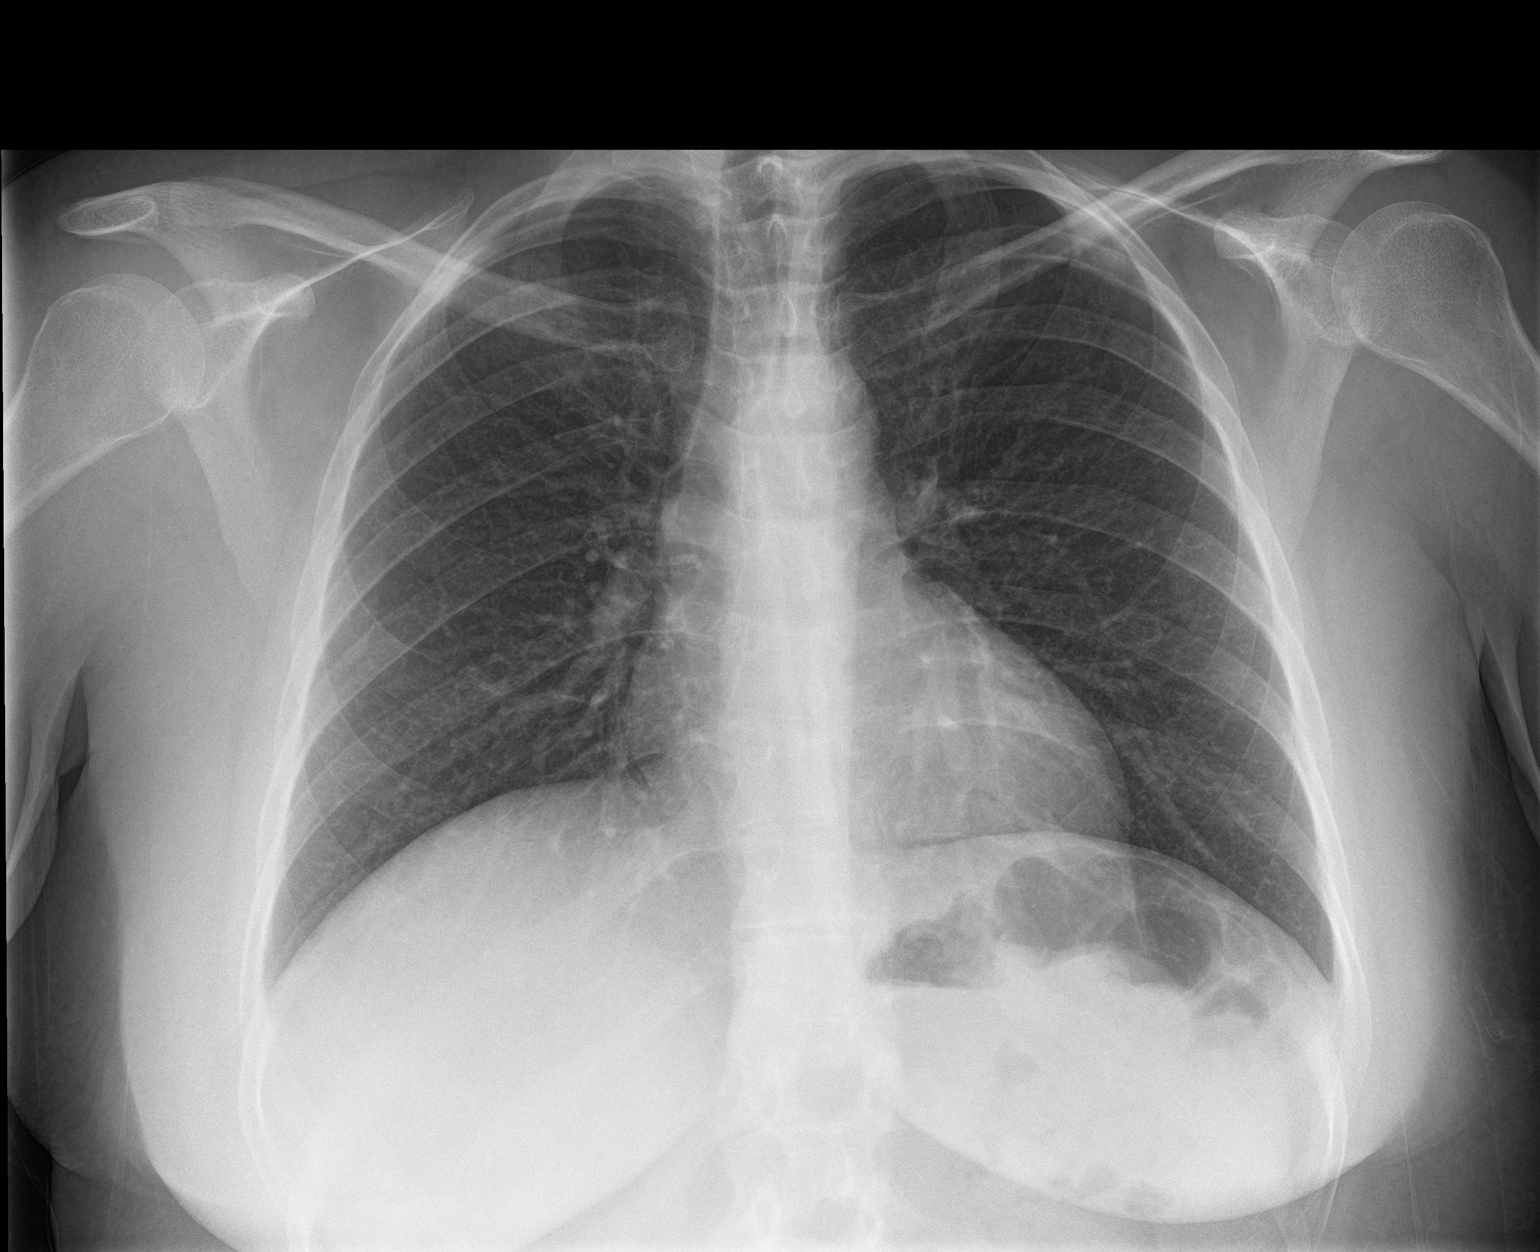

[chest lat]
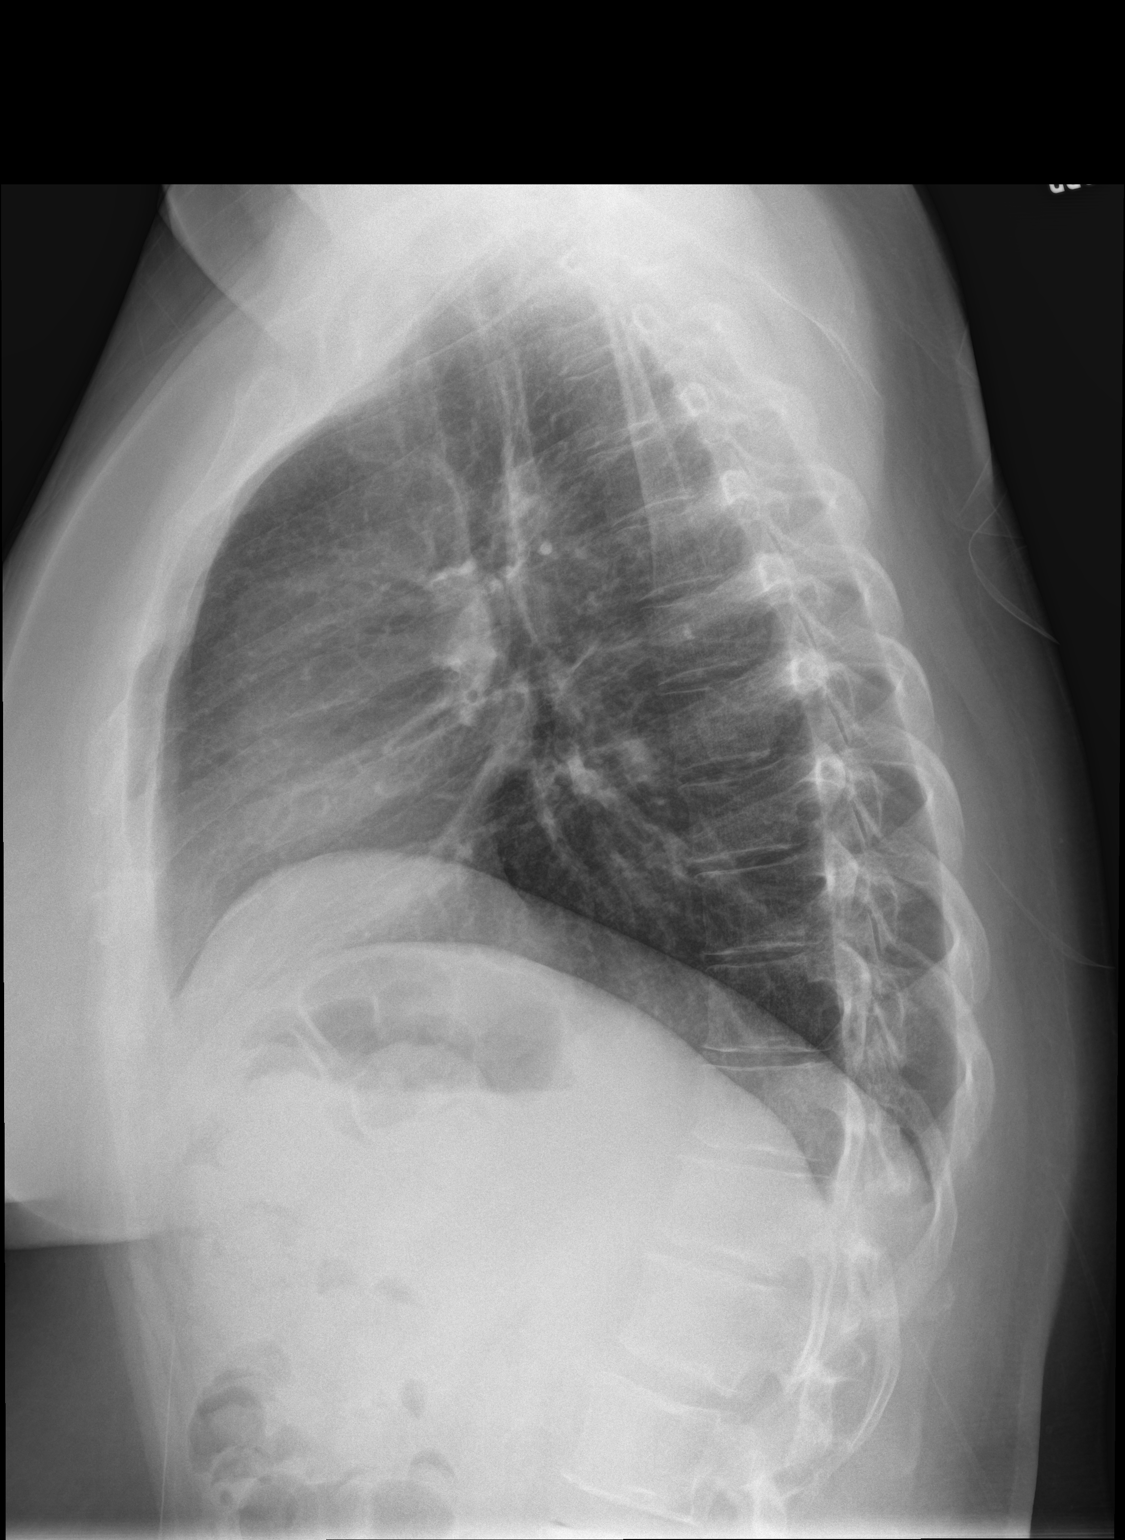

[2 of 2 positions shown; findings below may reference images not displayed]

FINDINGS: The heart size and mediastinal contours are within normal limits.
Both lungs are clear. The visualized skeletal structures are
unremarkable.
IMPRESSION: No active cardiopulmonary disease.

## 2017-01-12 IMAGING — CT CT HEAD W/O CM
1 series · 16 of 30 positions shown, 20 images · non-contrast
Comparison: CT 03/18/2005 and MRI 11/17/2003

CLINICAL DATA: Three-view history of headaches. History of 2 cysts
in brain behind left sinus and brain stem 19 years ago.

EXAM:
CT HEAD WITHOUT CONTRAST
TECHNIQUE: Contiguous axial images were obtained from the base of the skull
through the vertex without intravenous contrast.

[Series 2: headseq 4.8 h37s · axial · 0.44mm/px · z∈[+87,+224]mm · 16 of 30 slices shown, 20 images]
[im 2/30  brain]
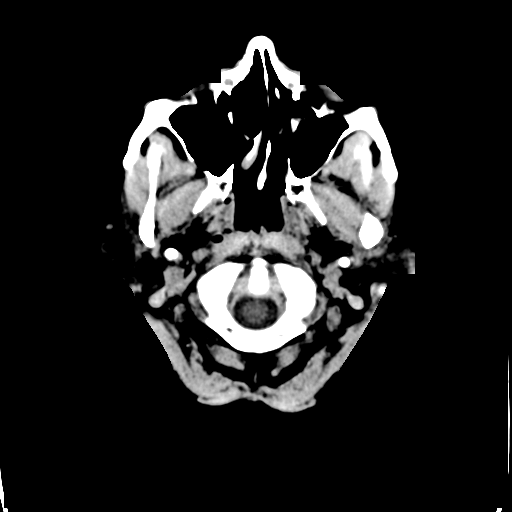
[im 2/30  bone]
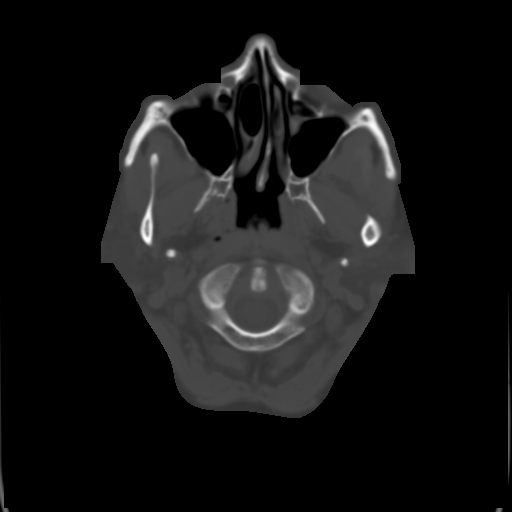
[im 4/30  brain]
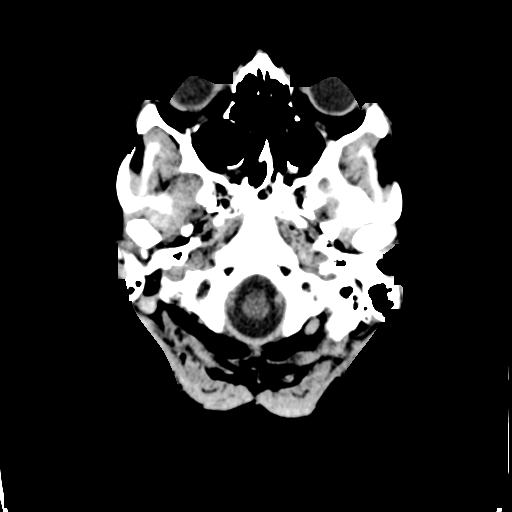
[im 6/30  brain]
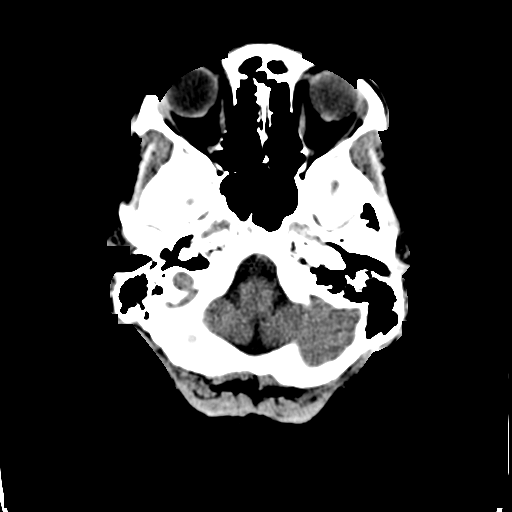
[im 8/30  brain]
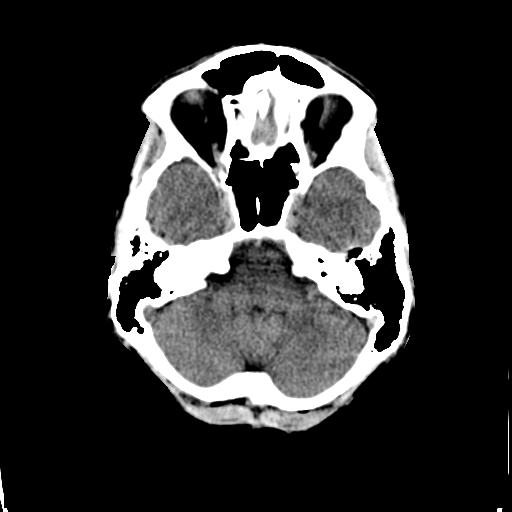
[im 9/30  brain]
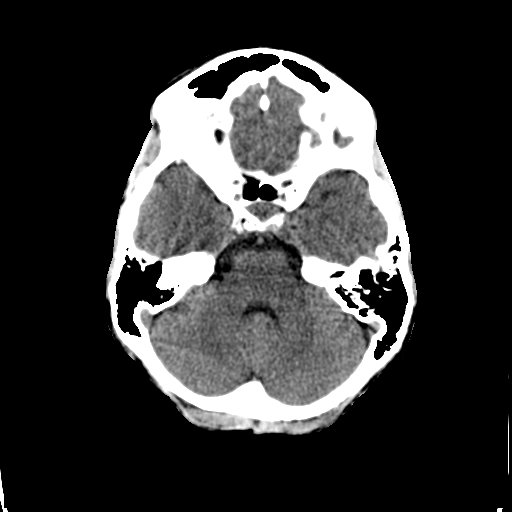
[im 9/30  bone]
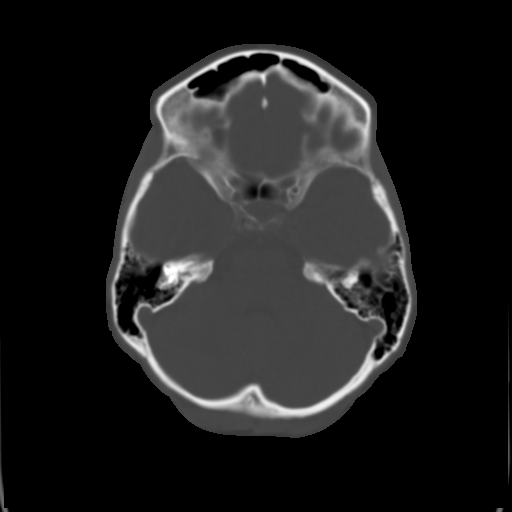
[im 11/30  brain]
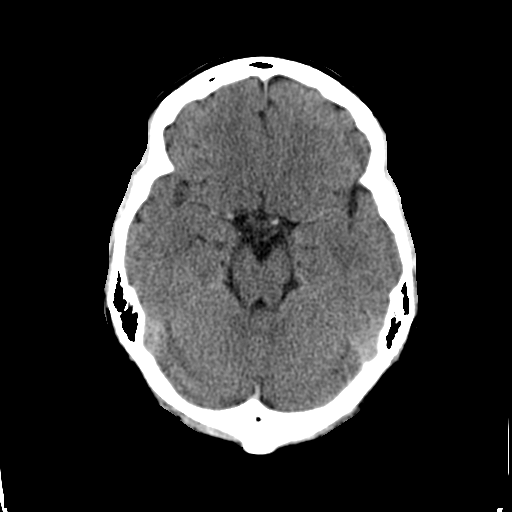
[im 13/30  brain]
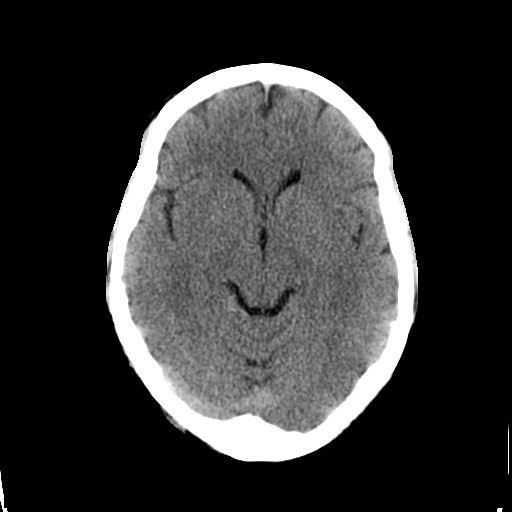
[im 15/30  brain]
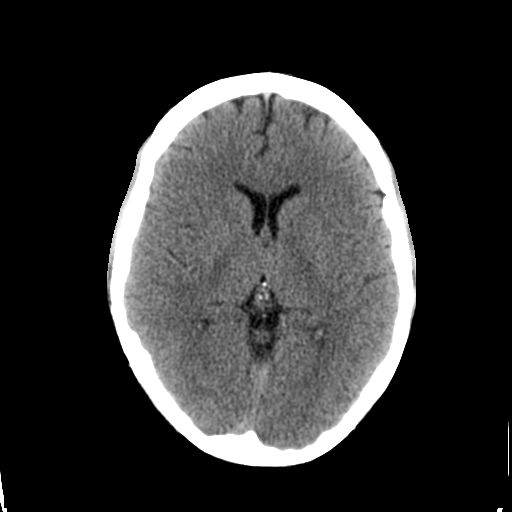
[im 16/30  brain]
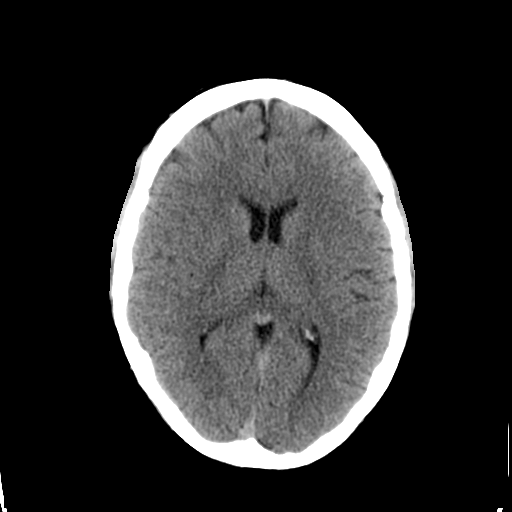
[im 16/30  bone]
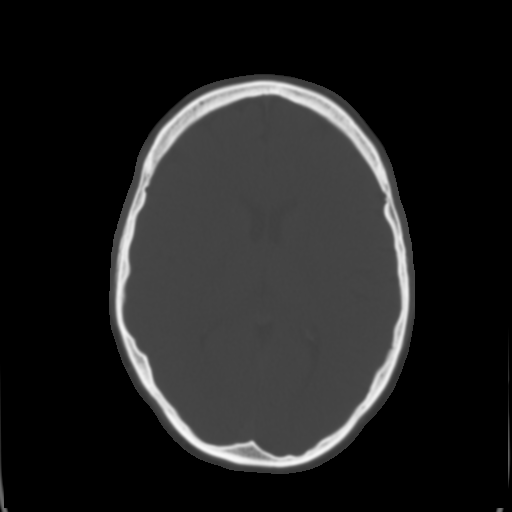
[im 18/30  brain]
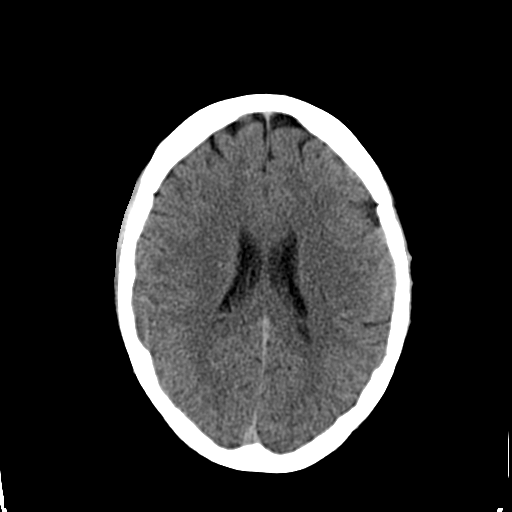
[im 20/30  brain]
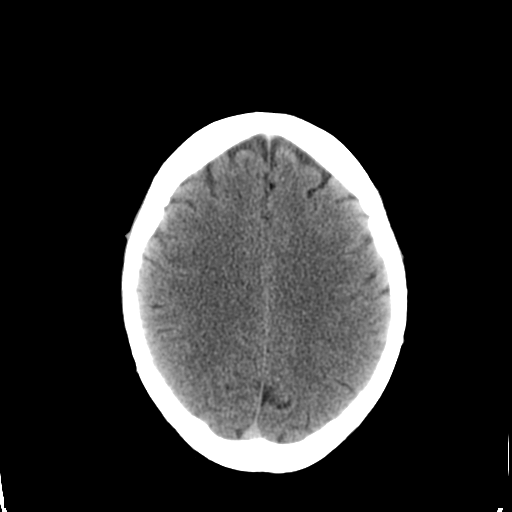
[im 22/30  brain]
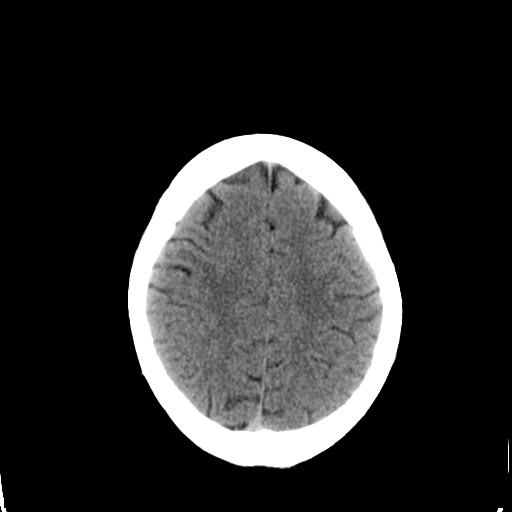
[im 23/30  brain]
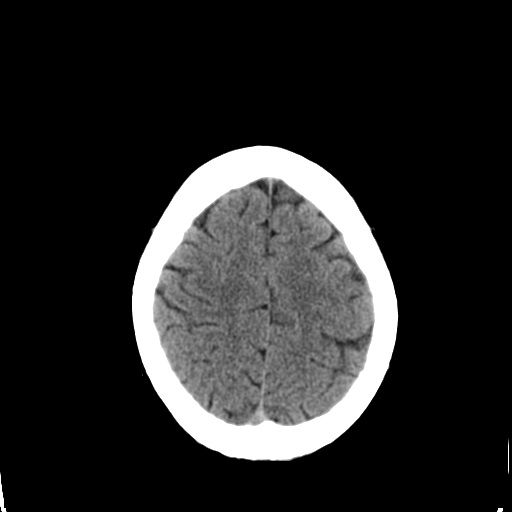
[im 23/30  bone]
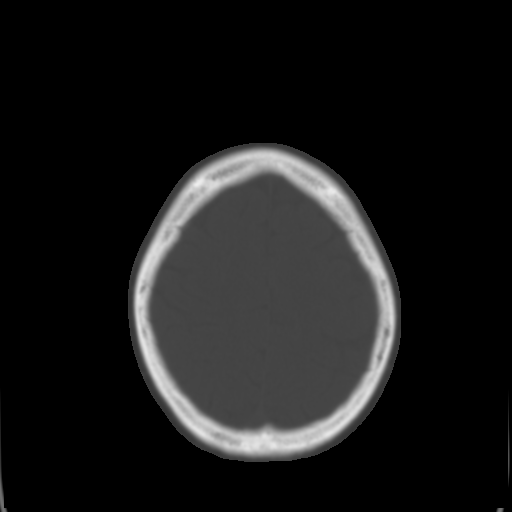
[im 25/30  brain]
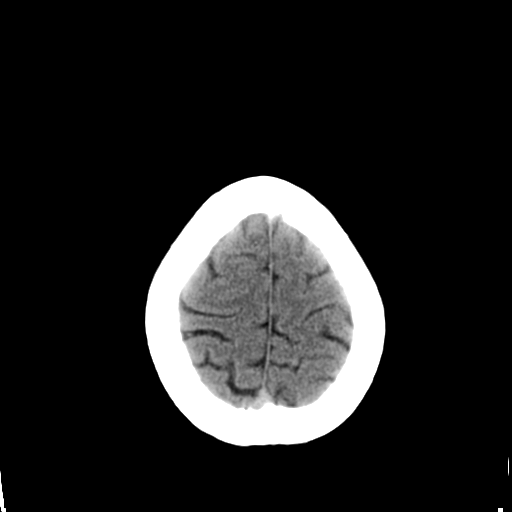
[im 27/30  brain]
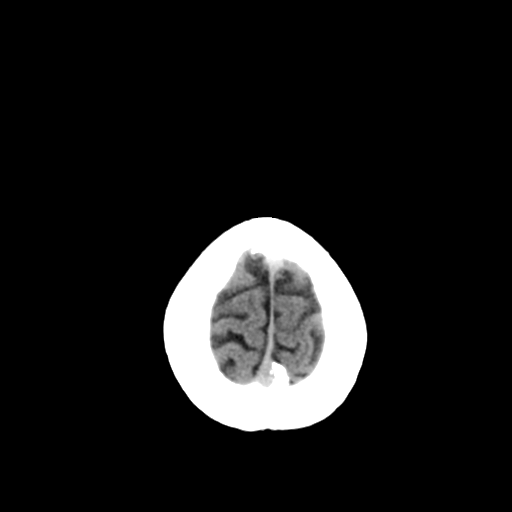
[im 29/30  brain]
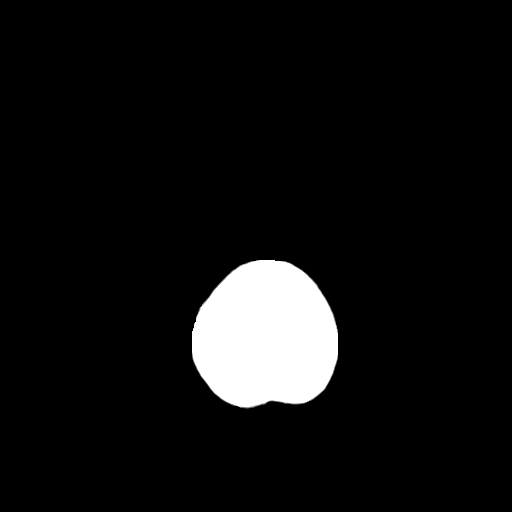

[16 of 30 positions shown; findings below may reference images not displayed]

FINDINGS: Ventricles, cisterns and other CSF spaces are within normal. Region
of patient's previously noted presumed pineal cyst is unchanged.
There is no mass effect, shift of midline structures or acute
hemorrhage. No evidence of acute infarction. Bones soft tissues are
normal.
IMPRESSION: No acute intracranial findings.

## 2017-01-16 ENCOUNTER — Encounter (INDEPENDENT_AMBULATORY_CARE_PROVIDER_SITE_OTHER): Payer: Medicaid Other | Admitting: Ophthalmology

## 2017-01-29 ENCOUNTER — Encounter (INDEPENDENT_AMBULATORY_CARE_PROVIDER_SITE_OTHER): Payer: Medicaid Other | Admitting: Ophthalmology

## 2017-01-29 DIAGNOSIS — H43813 Vitreous degeneration, bilateral: Secondary | ICD-10-CM

## 2017-01-29 DIAGNOSIS — E10311 Type 1 diabetes mellitus with unspecified diabetic retinopathy with macular edema: Secondary | ICD-10-CM

## 2017-01-29 DIAGNOSIS — E103513 Type 1 diabetes mellitus with proliferative diabetic retinopathy with macular edema, bilateral: Secondary | ICD-10-CM | POA: Diagnosis not present

## 2017-01-29 DIAGNOSIS — I10 Essential (primary) hypertension: Secondary | ICD-10-CM | POA: Diagnosis not present

## 2017-01-29 DIAGNOSIS — H35033 Hypertensive retinopathy, bilateral: Secondary | ICD-10-CM | POA: Diagnosis not present

## 2017-02-21 ENCOUNTER — Other Ambulatory Visit: Payer: Self-pay | Admitting: "Endocrinology

## 2017-03-12 ENCOUNTER — Encounter (INDEPENDENT_AMBULATORY_CARE_PROVIDER_SITE_OTHER): Payer: Medicaid Other | Admitting: Ophthalmology

## 2017-03-12 DIAGNOSIS — H43813 Vitreous degeneration, bilateral: Secondary | ICD-10-CM | POA: Diagnosis not present

## 2017-03-12 DIAGNOSIS — I10 Essential (primary) hypertension: Secondary | ICD-10-CM | POA: Diagnosis not present

## 2017-03-12 DIAGNOSIS — E103513 Type 1 diabetes mellitus with proliferative diabetic retinopathy with macular edema, bilateral: Secondary | ICD-10-CM | POA: Diagnosis not present

## 2017-03-12 DIAGNOSIS — H35033 Hypertensive retinopathy, bilateral: Secondary | ICD-10-CM

## 2017-03-12 DIAGNOSIS — E10311 Type 1 diabetes mellitus with unspecified diabetic retinopathy with macular edema: Secondary | ICD-10-CM

## 2017-04-06 ENCOUNTER — Ambulatory Visit: Payer: Medicaid Other | Admitting: "Endocrinology

## 2017-04-08 ENCOUNTER — Encounter (INDEPENDENT_AMBULATORY_CARE_PROVIDER_SITE_OTHER): Payer: Medicaid Other | Admitting: Ophthalmology

## 2017-04-08 DIAGNOSIS — E10311 Type 1 diabetes mellitus with unspecified diabetic retinopathy with macular edema: Secondary | ICD-10-CM | POA: Diagnosis not present

## 2017-04-08 DIAGNOSIS — E103592 Type 1 diabetes mellitus with proliferative diabetic retinopathy without macular edema, left eye: Secondary | ICD-10-CM

## 2017-04-08 DIAGNOSIS — E103511 Type 1 diabetes mellitus with proliferative diabetic retinopathy with macular edema, right eye: Secondary | ICD-10-CM | POA: Diagnosis not present

## 2017-04-08 DIAGNOSIS — H43813 Vitreous degeneration, bilateral: Secondary | ICD-10-CM

## 2017-04-08 DIAGNOSIS — I10 Essential (primary) hypertension: Secondary | ICD-10-CM

## 2017-04-08 DIAGNOSIS — H35033 Hypertensive retinopathy, bilateral: Secondary | ICD-10-CM | POA: Diagnosis not present

## 2017-04-20 ENCOUNTER — Other Ambulatory Visit: Payer: Self-pay | Admitting: "Endocrinology

## 2017-04-21 LAB — HGB A1C W/O EAG: HEMOGLOBIN A1C: 8 % — AB (ref 4.8–5.6)

## 2017-04-21 LAB — RENAL FUNCTION PANEL
Albumin: 4.3 g/dL (ref 3.5–5.5)
BUN / CREAT RATIO: 25 — AB (ref 9–23)
BUN: 16 mg/dL (ref 6–24)
CO2: 21 mmol/L (ref 20–29)
Calcium: 9.2 mg/dL (ref 8.7–10.2)
Chloride: 104 mmol/L (ref 96–106)
Creatinine, Ser: 0.64 mg/dL (ref 0.57–1.00)
GFR, EST AFRICAN AMERICAN: 126 mL/min/{1.73_m2} (ref >59)
GFR, EST NON AFRICAN AMERICAN: 109 mL/min/{1.73_m2} (ref >59)
GLUCOSE: 161 mg/dL — AB (ref 65–99)
POTASSIUM: 4.7 mmol/L (ref 3.5–5.2)
Phosphorus: 3.7 mg/dL (ref 2.5–4.5)
Sodium: 138 mmol/L (ref 134–144)

## 2017-04-21 LAB — LIPID PANEL W/O CHOL/HDL RATIO
Cholesterol, Total: 147 mg/dL (ref 100–199)
HDL: 41 mg/dL (ref >39)
LDL Calculated: 70 mg/dL (ref 0–99)
Triglycerides: 182 mg/dL — ABNORMAL HIGH (ref 0–149)
VLDL CHOLESTEROL CAL: 36 mg/dL (ref 5–40)

## 2017-04-24 ENCOUNTER — Ambulatory Visit (INDEPENDENT_AMBULATORY_CARE_PROVIDER_SITE_OTHER): Payer: Medicaid Other | Admitting: "Endocrinology

## 2017-04-24 ENCOUNTER — Telehealth: Payer: Self-pay | Admitting: *Deleted

## 2017-04-24 ENCOUNTER — Encounter: Payer: Self-pay | Admitting: "Endocrinology

## 2017-04-24 VITALS — BP 134/87 | HR 83 | Ht 63.5 in | Wt 152.6 lb

## 2017-04-24 DIAGNOSIS — E782 Mixed hyperlipidemia: Secondary | ICD-10-CM

## 2017-04-24 DIAGNOSIS — I10 Essential (primary) hypertension: Secondary | ICD-10-CM | POA: Diagnosis not present

## 2017-04-24 DIAGNOSIS — E11311 Type 2 diabetes mellitus with unspecified diabetic retinopathy with macular edema: Secondary | ICD-10-CM

## 2017-04-24 DIAGNOSIS — Z794 Long term (current) use of insulin: Secondary | ICD-10-CM | POA: Diagnosis not present

## 2017-04-24 MED ORDER — INSULIN GLARGINE 100 UNIT/ML SOLOSTAR PEN: 30.0000 [IU] | PEN_INJECTOR | Freq: Every day | SUBCUTANEOUS | 2 refills | Status: DC

## 2017-04-24 MED ORDER — SITAGLIPTIN PHOSPHATE 50 MG PO TABS: 50.0000 mg | ORAL_TABLET | Freq: Every day | ORAL | 3 refills | Status: DC

## 2017-04-24 MED ORDER — LINAGLIPTIN 5 MG PO TABS: 5.0000 mg | ORAL_TABLET | Freq: Every day | ORAL | 2 refills | Status: DC

## 2017-04-24 NOTE — Progress Notes (Signed)
Subjective:    Patient ID: Allison [name], female    DOB: December 03, 1970, PCP [name], MD   Past Medical History:  Diagnosis Date  . Cancer (HCC)    cervical   . Dyslipidemia   . Gastroparesis    possible  . Glaucoma   . Hypertension   . Insulin dependent diabetes mellitus ([place])   . Neuropathy   . Retinopathy   . 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 ABDOMINAL HYSTERECTOMY     partial   . EYE SURGERY     multiple   Social History   Socioeconomic History  . Marital status: Divorced    Spouse name: None  . Number of children: None  . Years of education: None  . Highest education level: None  Social Needs  . Financial resource strain: None  . Food insecurity - worry: None  . Food insecurity - inability: None  . Transportation needs - medical: None  . Transportation needs - non-medical: None  Occupational History  . None  Tobacco Use  . Smoking status: Current Some Day Smoker    Packs/day: 0.25    Years: 26.00    Pack years: 6.50    Types: Cigarettes    Last attempt to quit: 05/05/2014    Years since quitting: 2.9  . Smokeless tobacco: Never Used  Substance and Sexual Activity  . Alcohol use: No  . Drug use: No  . Sexual activity: Yes    Birth control/protection: Surgical  Other Topics Concern  . None  Social History Narrative  . None   Outpatient Encounter Medications as of 04/24/2017  Medication Sig  . albuterol (PROAIR HFA) 108 (90 BASE) MCG/ACT inhaler Inhale 2 puffs into the lungs every 4 (four) hours as needed for wheezing or shortness of breath.  . ALPRAZolam (XANAX) 1 MG tablet Take 1 mg by mouth 3 (three) times daily as needed for anxiety.   [name] aspirin EC 81 MG tablet Take 81 mg by mouth daily.  . blood glucose meter kit and supplies KIT Dispense based on patient and insurance preference. Use up to four times daily as directed. (FOR ICD 10 E11.65).  [name] conjugated estrogens  (PREMARIN) vaginal cream Place 9.79 Applicatorfuls vaginally 3 (three) times a week.  . furosemide (LASIX) 20 MG tablet Take 20 mg by mouth 2 (two) times daily as needed.   [name] glucose blood (ACCU-CHEK AVIVA PLUS) test strip USE AS DIRECTED UP TO FOUR TIMES DAILY.  [name] HYDROcodone-acetaminophen (NORCO) 10-325 MG per tablet Take 1 tablet by mouth every 4 (four) hours as needed for severe pain.  . Insulin Glargine (LANTUS SOLOSTAR) 100 UNIT/ML Solostar Pen Inject 30 Units into the skin daily at 10 pm.  . Insulin Pen Needle (B-D ULTRAFINE III SHORT PEN) 31G X 8 MM MISC 1 each by Does not apply route as directed.  . loratadine (CLARITIN) 10 MG tablet Take 10 mg by mouth daily.  . metroNIDAZOLE (FLAGYL) 500 MG tablet Take 1 tablet (500 mg total) by mouth 2 (two) times daily.  . Multiple Vitamin (MULTIVITAMIN) tablet Take 1 tablet by mouth daily.  [name] omeprazole (PRILOSEC) 20 MG capsule Take 20 mg by mouth 2 (two) times daily before a meal.   . polyethylene glycol (MIRALAX / GLYCOLAX) packet Take 17 g by mouth daily.  . potassium chloride SA (K-DUR,KLOR-CON) 20 MEQ tablet Take 20 mEq by mouth 2 (two) times daily.   . [  DISCONTINUED] canagliflozin (INVOKANA) 100 MG TABS tablet Take 1 tablet (100 mg total) by mouth daily.  . [DISCONTINUED] Insulin Glargine (LANTUS SOLOSTAR) 100 UNIT/ML Solostar Pen Inject 20 Units into the skin daily at 10 pm.  . [DISCONTINUED] LANTUS SOLOSTAR 100 UNIT/ML Solostar Pen INJECT 20 UNITS INTO THE SKIN DAILY AT 10 PM.  . linagliptin (TRADJENTA) 5 MG TABS tablet Take 1 tablet (5 mg total) by mouth daily.  . [DISCONTINUED] sitaGLIPtin (JANUVIA) 50 MG tablet Take 1 tablet (50 mg total) by mouth daily.   No facility-administered encounter medications on file as of 04/24/2017.    ALLERGIES: Allergies  Allergen Reactions  . Beta Adrenergic Blockers     Heart palpitations  . Codeine Other (See Comments)    TYLENOL #3  Welts and itching  . Cymbalta [Duloxetine Hcl]     SOB  .  Doxycycline Other (See Comments)    Heart problems  . Lexapro [Escitalopram Oxalate]     Heart palpitations and hives  . Other Palpitations    Allergic to all beta blocker eye drops!!  . Oxycodone Itching   VACCINATION STATUS:  There is no immunization history on file for this patient.  Diabetes  She presents for her follow-up diabetic visit. She has type 2 diabetes mellitus. Onset time: She was diagnosed at approximate age of 63 years. Her disease course has been worsening. There are no hypoglycemic associated symptoms. Pertinent negatives for hypoglycemia include no confusion, headaches, pallor or seizures. There are no diabetic associated symptoms. Pertinent negatives for diabetes include no chest pain, no polydipsia, no polyphagia and no polyuria. There are no hypoglycemic complications. Symptoms are worsening. Diabetic complications include retinopathy. Risk factors for coronary artery disease include diabetes mellitus, dyslipidemia and hypertension. Current diabetic treatment includes insulin injections and oral agent (monotherapy). She is compliant with treatment most of the time. Her weight is stable. She is following a generally unhealthy diet. When asked about meal planning, she reported none. She never participates in exercise. There is no change in her home blood glucose trend. Her breakfast blood glucose range is generally 140-180 mg/dl. Her overall blood glucose range is 140-180 mg/dl. Eye exam is current.  Hyperlipidemia  This is a chronic problem. The current episode started more than 1 year ago. Exacerbating diseases include diabetes. Pertinent negatives include no chest pain, myalgias or shortness of breath. Risk factors for coronary artery disease include diabetes mellitus, dyslipidemia, hypertension and a sedentary lifestyle.  Hypertension  This is a chronic problem. The current episode started more than 1 year ago. Pertinent negatives include no chest pain, headaches,  palpitations or shortness of breath. Risk factors for coronary artery disease include dyslipidemia, diabetes mellitus, obesity and sedentary lifestyle. Hypertensive end-organ damage includes retinopathy.     Review of Systems  Constitutional: Negative for chills, fever and unexpected weight change.  HENT: Negative for trouble swallowing and voice change.   Eyes: Negative for visual disturbance.  Respiratory: Negative for cough, shortness of breath and wheezing.   Cardiovascular: Negative for chest pain, palpitations and leg swelling.  Gastrointestinal: Negative for diarrhea, nausea and vomiting.  Endocrine: Negative for cold intolerance, heat intolerance, polydipsia, polyphagia and polyuria.  Musculoskeletal: Negative for arthralgias and myalgias.  Skin: Negative for color change, pallor, rash and wound.  Neurological: Negative for seizures and headaches.  Psychiatric/Behavioral: Negative for confusion and suicidal ideas.    Objective:    BP 134/87   Pulse 83   Ht 5' 3.5" (1.613 m)   Wt 152 lb 9.6  oz (69.2 kg)   BMI 26.61 kg/m   Wt Readings from Last 3 Encounters:  04/24/17 152 lb 9.6 oz (69.2 kg)  12/29/16 150 lb (68 kg)  10/09/16 149 lb (67.6 kg)    Physical Exam  Constitutional: She is oriented to person, place, and time. She appears well-developed.  HENT:  Head: Normocephalic and atraumatic.  Eyes: EOM are normal.  Neck: Normal range of motion. Neck supple. No tracheal deviation present. No thyromegaly present.  Cardiovascular: Normal rate and regular rhythm.  Pulmonary/Chest: Effort normal and breath sounds normal.  Abdominal: Soft. Bowel sounds are normal. There is no tenderness. There is no guarding.  Musculoskeletal: Normal range of motion. She exhibits no edema.  Neurological: She is alert and oriented to person, place, and time. She has normal reflexes. No cranial nerve deficit. Coordination normal.  Skin: Skin is warm and dry. No rash noted. No erythema. No  pallor.  Psychiatric: She has a normal mood and affect. Judgment normal.   Results for orders placed or performed in visit on 04/20/17  Renal function panel  Result Value Ref Range   Glucose 161 (H) 65 - 99 mg/dL   BUN 16 6 - 24 mg/dL   Creatinine, Ser 0.64 0.57 - 1.00 mg/dL   GFR calc non Af Amer 109 >59 mL/min/1.73   GFR calc Af Amer 126 >59 mL/min/1.73   BUN/Creatinine Ratio 25 (H) 9 - 23   Sodium 138 134 - 144 mmol/L   Potassium 4.7 3.5 - 5.2 mmol/L   Chloride 104 96 - 106 mmol/L   CO2 21 20 - 29 mmol/L   Calcium 9.2 8.7 - 10.2 mg/dL   Phosphorus 3.7 2.5 - 4.5 mg/dL   Albumin 4.3 3.5 - 5.5 g/dL  Lipid Panel w/o Chol/HDL Ratio  Result Value Ref Range   Cholesterol, Total 147 100 - 199 mg/dL   Triglycerides 182 (H) 0 - 149 mg/dL   HDL 41 >39 mg/dL   VLDL Cholesterol Cal 36 5 - 40 mg/dL   LDL Calculated 70 0 - 99 mg/dL  Hgb A1c w/o eAG  Result Value Ref Range   Hgb A1c MFr Bld 8.0 (H) 4.8 - 5.6 %   Diabetic Labs (most recent): Lab Results  Component Value Date   HGBA1C 8.0 (H) 04/20/2017   HGBA1C 7.3 (H) 12/17/2016   HGBA1C 7.4 (H) 08/28/2016     Assessment & Plan:   1. 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 These are all discussed in detail with the patient.  Patient came with loss of control of diabetes with fasting glucose profile averaging 146, her A1c increasing to 8% from 7.3%.   -  she has history of very high A1c up to 14.7% in the past.    Recent labs reviewed.  - I have re-counseled the patient on diet management  by adopting a carbohydrate restricted / protein rich  Diet.  -  Suggestion is made for her to avoid simple carbohydrates   from her diet including Cakes, Sweet Desserts / Pastries, Ice Cream, Soda (diet and regular), Sweet Tea, Candies, Chips, Cookies, Store Bought Juices, Alcohol in Excess of  1-2 drinks a day, Artificial Sweeteners, and "Sugar-free" Products. This will help  patient to have stable blood glucose profile and potentially avoid unintended weight gain.  - Patient is advised to stick to a routine mealtimes to eat 3 meals  a day and avoid unnecessary snacks ( to snack only to correct hypoglycemia).  - I have approached patient with the following individualized plan to manage diabetes and patient agrees.  - I will   increase basal insulin Lantus to 30 units daily at bedtime, associated with strict monitoring of blood glucose daily before breakfast and as needed.  -she may need prandial insulin if her A1c continues to increase.  - I advised her to discontinue Invokana ,  will replace with Januvia 50 mg by mouth every morning with breakfast if her insurance cooperates.  - She does not tolerate metformin.  - Patient specific target  for A1c; LDL, HDL, Triglycerides, and  Waist Circumference were discussed in detail.  2) BP/HTN: Controlled. Continue current medications. 3) Lipids/HPL:  continue statins. 4)  Weight/Diet:  exercise, and carbohydrates information provided.  5) Chronic Care/Health Maintenance:  -Patient is on  Statin medications and encouraged to continue to follow up with Ophthalmology, Podiatrist at least yearly or according to recommendations, and advised to quit smoking. I have recommended yearly flu vaccine and pneumonia vaccination at least every 5 years; moderate intensity exercise for up to 150 minutes weekly; and  sleep for at least 7 hours a day.  - I advised patient to maintain close follow up with [name], MD for primary care needs.   Follow up plan: -Return in about 3 months (around 07/23/2017) for follow up with pre-visit labs, meter, and logs. - Time spent with the  patient: 25 min, of which >50% was spent in reviewing her sugar logs , discussing her hypo- and hyper-glycemic episodes, reviewing her current and  previous labs and insulin doses and developing a plan to avoid hypo- and hyper-glycemia.   Glade Lloyd, MD Phone: [phone]  Fax: [phone]  -  This note was partially dictated with voice recognition software. Similar sounding words can be transcribed inadequately or may not  be corrected upon review.  04/24/2017, 12:56 PM

## 2017-04-24 NOTE — Patient Instructions (Signed)

## 2017-04-24 NOTE — Telephone Encounter (Signed)
Patient called stating medicaid will not pay for the januvia and I made her aware Dr Dorris Fetch will send the tradienta.

## 2017-04-27 ENCOUNTER — Encounter (INDEPENDENT_AMBULATORY_CARE_PROVIDER_SITE_OTHER): Payer: Medicaid Other | Admitting: Ophthalmology

## 2017-04-27 DIAGNOSIS — H35033 Hypertensive retinopathy, bilateral: Secondary | ICD-10-CM

## 2017-04-27 DIAGNOSIS — E10311 Type 1 diabetes mellitus with unspecified diabetic retinopathy with macular edema: Secondary | ICD-10-CM | POA: Diagnosis not present

## 2017-04-27 DIAGNOSIS — E103513 Type 1 diabetes mellitus with proliferative diabetic retinopathy with macular edema, bilateral: Secondary | ICD-10-CM | POA: Diagnosis not present

## 2017-04-27 DIAGNOSIS — H43813 Vitreous degeneration, bilateral: Secondary | ICD-10-CM | POA: Diagnosis not present

## 2017-04-27 DIAGNOSIS — I10 Essential (primary) hypertension: Secondary | ICD-10-CM

## 2017-05-01 ENCOUNTER — Ambulatory Visit: Payer: Medicaid Other | Admitting: "Endocrinology

## 2017-05-27 ENCOUNTER — Encounter (INDEPENDENT_AMBULATORY_CARE_PROVIDER_SITE_OTHER): Payer: Medicaid Other | Admitting: Ophthalmology

## 2017-05-27 DIAGNOSIS — E10311 Type 1 diabetes mellitus with unspecified diabetic retinopathy with macular edema: Secondary | ICD-10-CM | POA: Diagnosis not present

## 2017-05-27 DIAGNOSIS — E103511 Type 1 diabetes mellitus with proliferative diabetic retinopathy with macular edema, right eye: Secondary | ICD-10-CM

## 2017-05-27 DIAGNOSIS — I10 Essential (primary) hypertension: Secondary | ICD-10-CM | POA: Diagnosis not present

## 2017-05-27 DIAGNOSIS — H43813 Vitreous degeneration, bilateral: Secondary | ICD-10-CM

## 2017-05-27 DIAGNOSIS — E103592 Type 1 diabetes mellitus with proliferative diabetic retinopathy without macular edema, left eye: Secondary | ICD-10-CM | POA: Diagnosis not present

## 2017-05-27 DIAGNOSIS — H35033 Hypertensive retinopathy, bilateral: Secondary | ICD-10-CM

## 2017-06-15 ENCOUNTER — Encounter (INDEPENDENT_AMBULATORY_CARE_PROVIDER_SITE_OTHER): Payer: Medicaid Other | Admitting: Ophthalmology

## 2017-06-19 ENCOUNTER — Other Ambulatory Visit: Payer: Self-pay | Admitting: Family Medicine

## 2017-06-19 DIAGNOSIS — G4489 Other headache syndrome: Secondary | ICD-10-CM

## 2017-06-23 ENCOUNTER — Other Ambulatory Visit: Payer: Self-pay | Admitting: "Endocrinology

## 2017-06-24 LAB — COMPREHENSIVE METABOLIC PANEL
A/G RATIO: 1.3 (ref 1.2–2.2)
ALT: 12 IU/L (ref 0–32)
AST: 15 IU/L (ref 0–40)
Albumin: 3.7 g/dL (ref 3.5–5.5)
Alkaline Phosphatase: 75 IU/L (ref 39–117)
BUN/Creatinine Ratio: 28 — ABNORMAL HIGH (ref 9–23)
BUN: 15 mg/dL (ref 6–24)
Bilirubin Total: <0.2 mg/dL (ref 0.0–1.2)
CHLORIDE: 102 mmol/L (ref 96–106)
CO2: 25 mmol/L (ref 20–29)
Calcium: 9.3 mg/dL (ref 8.7–10.2)
Creatinine, Ser: 0.54 mg/dL — ABNORMAL LOW (ref 0.57–1.00)
GFR calc Af Amer: 133 mL/min/{1.73_m2} (ref >59)
GFR calc non Af Amer: 115 mL/min/{1.73_m2} (ref >59)
Globulin, Total: 2.8 g/dL (ref 1.5–4.5)
Glucose: 169 mg/dL — ABNORMAL HIGH (ref 65–99)
POTASSIUM: 4.8 mmol/L (ref 3.5–5.2)
Sodium: 144 mmol/L (ref 134–144)
Total Protein: 6.5 g/dL (ref 6.0–8.5)

## 2017-06-24 LAB — HGB A1C W/O EAG: Hgb A1c MFr Bld: 7.4 % — ABNORMAL HIGH (ref 4.8–5.6)

## 2017-06-24 LAB — SPECIMEN STATUS REPORT

## 2017-06-29 ENCOUNTER — Other Ambulatory Visit (HOSPITAL_COMMUNITY): Payer: Self-pay | Admitting: Family Medicine

## 2017-06-29 DIAGNOSIS — G4489 Other headache syndrome: Secondary | ICD-10-CM

## 2017-06-30 ENCOUNTER — Encounter: Payer: Self-pay | Admitting: "Endocrinology

## 2017-06-30 ENCOUNTER — Ambulatory Visit (INDEPENDENT_AMBULATORY_CARE_PROVIDER_SITE_OTHER): Payer: Medicaid Other | Admitting: "Endocrinology

## 2017-06-30 VITALS — BP 138/84 | HR 80 | Ht 63.5 in | Wt 157.0 lb

## 2017-06-30 DIAGNOSIS — I10 Essential (primary) hypertension: Secondary | ICD-10-CM | POA: Diagnosis not present

## 2017-06-30 DIAGNOSIS — E11311 Type 2 diabetes mellitus with unspecified diabetic retinopathy with macular edema: Secondary | ICD-10-CM | POA: Diagnosis not present

## 2017-06-30 DIAGNOSIS — E1142 Type 2 diabetes mellitus with diabetic polyneuropathy: Secondary | ICD-10-CM

## 2017-06-30 DIAGNOSIS — E782 Mixed hyperlipidemia: Secondary | ICD-10-CM | POA: Diagnosis not present

## 2017-06-30 DIAGNOSIS — Z794 Long term (current) use of insulin: Secondary | ICD-10-CM

## 2017-06-30 NOTE — Patient Instructions (Signed)

## 2017-06-30 NOTE — Progress Notes (Signed)
Subjective:    Patient ID: Allison [name], female    DOB: 11-05-1970, PCP [name], MD   Past Medical History:  Diagnosis Date  . Cancer (HCC)    cervical   . Dyslipidemia   . Gastroparesis    possible  . Glaucoma   . Hypertension   . Insulin dependent diabetes mellitus ([place])   . Neuropathy   . Retinopathy   . 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 ABDOMINAL HYSTERECTOMY     partial   . EYE SURGERY     multiple   Social History   Socioeconomic History  . Marital status: Divorced    Spouse name: None  . Number of children: None  . Years of education: None  . Highest education level: None  Social Needs  . Financial resource strain: None  . Food insecurity - worry: None  . Food insecurity - inability: None  . Transportation needs - medical: None  . Transportation needs - non-medical: None  Occupational History  . None  Tobacco Use  . Smoking status: Current Some Day Smoker    Packs/day: 0.25    Years: 26.00    Pack years: 6.50    Types: Cigarettes    Last attempt to quit: 05/05/2014    Years since quitting: 3.1  . Smokeless tobacco: Never Used  Substance and Sexual Activity  . Alcohol use: No  . Drug use: No  . Sexual activity: Yes    Birth control/protection: Surgical  Other Topics Concern  . None  Social History Narrative  . None   Outpatient Encounter Medications as of 06/30/2017  Medication Sig  . albuterol (PROAIR HFA) 108 (90 BASE) MCG/ACT inhaler Inhale 2 puffs into the lungs every 4 (four) hours as needed for wheezing or shortness of breath.  . ALPHAGAN P 0.1 % SOLN Place 1 drop into both eyes 2 (two) times daily.  [name] ALPRAZolam (XANAX) 0.5 MG tablet Take 0.5 mg by mouth 4 (four) times daily as needed for anxiety.   [name] amphetamine-dextroamphetamine (ADDERALL) 10 MG tablet Take 10 mg by mouth 2 (two) times daily with a meal.  . aspirin EC 81 MG tablet Take 81 mg by mouth daily.  .  blood glucose meter kit and supplies KIT Dispense based on patient and insurance preference. Use up to four times daily as directed. (FOR ICD 10 E11.65).  [name] conjugated estrogens (PREMARIN) vaginal cream Place 1.61 Applicatorfuls vaginally 3 (three) times a week.  . furosemide (LASIX) 20 MG tablet Take 20 mg by mouth 2 (two) times daily as needed.   [name] glucose blood (ACCU-CHEK AVIVA PLUS) test strip USE AS DIRECTED UP TO FOUR TIMES DAILY.  [name] HYDROcodone-acetaminophen (NORCO) 7.5-325 MG tablet Take 1 tablet by mouth every 4 (four) hours as needed for severe pain.  . Insulin Glargine (LANTUS SOLOSTAR) 100 UNIT/ML Solostar Pen Inject 30 Units into the skin daily at 10 pm. (Patient taking differently: Inject 20 Units into the skin daily at 10 pm. )  . Insulin Pen Needle (B-D ULTRAFINE III SHORT PEN) 31G X 8 MM MISC 1 each by Does not apply route as directed.  . loratadine (CLARITIN) 10 MG tablet Take 10 mg by mouth daily.  . Melatonin 5 MG TABS Take 1 tablet by mouth at bedtime.  . moxifloxacin (VIGAMOX) 0.5 % ophthalmic solution Place 1 drop into both eyes See admin instructions. Uses when having eye  injections (every 6 weeks)  . Multiple Vitamin (MULTIVITAMIN) tablet Take 1 tablet by mouth daily.  [name] omeprazole (PRILOSEC) 20 MG capsule Take 20 mg by mouth 2 (two) times daily before a meal.   . polyethylene glycol (MIRALAX / GLYCOLAX) packet Take 17 g by mouth daily.  . potassium chloride SA (K-DUR,KLOR-CON) 20 MEQ tablet Take 20 mEq by mouth 2 (two) times daily.   . sitaGLIPtin (JANUVIA) 50 MG tablet Take 50 mg by mouth every morning.   No facility-administered encounter medications on file as of 06/30/2017.    ALLERGIES: Allergies  Allergen Reactions  . Beta Adrenergic Blockers     Heart palpitations  . Codeine Other (See Comments)    TYLENOL #3  Welts and itching  . Cymbalta [Duloxetine Hcl]     SOB  . Doxycycline Other (See Comments)    Heart problems  . Lexapro [Escitalopram Oxalate]      Heart palpitations and hives  . Other Palpitations    Allergic to all beta blocker eye drops!!  . Oxycodone Itching   VACCINATION STATUS:  There is no immunization history on file for this patient.  Diabetes  She presents for her follow-up diabetic visit. She has type 2 diabetes mellitus. Onset time: She was diagnosed at approximate age of 58 years. Her disease course has been improving. There are no hypoglycemic associated symptoms. Pertinent negatives for hypoglycemia include no confusion, headaches, pallor or seizures. There are no diabetic associated symptoms. Pertinent negatives for diabetes include no chest pain, no polydipsia, no polyphagia and no polyuria. There are no hypoglycemic complications. Symptoms are improving. Diabetic complications include retinopathy. Risk factors for coronary artery disease include diabetes mellitus, dyslipidemia and hypertension. Current diabetic treatment includes insulin injections and oral agent (monotherapy). She is compliant with treatment most of the time. Her weight is increasing steadily. She is following a generally unhealthy diet. When asked about meal planning, she reported none. She never participates in exercise. There is no change in her home blood glucose trend. Her breakfast blood glucose range is generally 140-180 mg/dl. Her overall blood glucose range is 140-180 mg/dl. Eye exam is current.  Hyperlipidemia  This is a chronic problem. The current episode started more than 1 year ago. Exacerbating diseases include diabetes. Pertinent negatives include no chest pain, myalgias or shortness of breath. Risk factors for coronary artery disease include diabetes mellitus, dyslipidemia, hypertension and a sedentary lifestyle.  Hypertension  This is a chronic problem. The current episode started more than 1 year ago. Pertinent negatives include no chest pain, headaches, palpitations or shortness of breath. Risk factors for coronary artery disease include  dyslipidemia, diabetes mellitus, obesity and sedentary lifestyle. Hypertensive end-organ damage includes retinopathy.     Review of Systems  Constitutional: Negative for chills, fever and unexpected weight change.  HENT: Negative for trouble swallowing and voice change.   Eyes: Negative for visual disturbance.  Respiratory: Negative for cough, shortness of breath and wheezing.   Cardiovascular: Negative for chest pain, palpitations and leg swelling.  Gastrointestinal: Negative for diarrhea, nausea and vomiting.  Endocrine: Negative for cold intolerance, heat intolerance, polydipsia, polyphagia and polyuria.  Musculoskeletal: Negative for arthralgias and myalgias.  Skin: Negative for color change, pallor, rash and wound.  Neurological: Negative for seizures and headaches.  Psychiatric/Behavioral: Negative for confusion and suicidal ideas.    Objective:    BP 138/84   Pulse 80   Ht 5' 3.5" (1.613 m)   Wt 157 lb (71.2 kg)   BMI 27.38 kg/m  Wt Readings from Last 3 Encounters:  06/30/17 157 lb (71.2 kg)  04/24/17 152 lb 9.6 oz (69.2 kg)  12/29/16 150 lb (68 kg)    Physical Exam  Constitutional: She is oriented to person, place, and time. She appears well-developed.  HENT:  Head: Normocephalic and atraumatic.  Eyes: EOM are normal.  Neck: Normal range of motion. Neck supple. No tracheal deviation present. No thyromegaly present.  Cardiovascular: Normal rate and regular rhythm.  Pulmonary/Chest: Effort normal and breath sounds normal.  Abdominal: Soft. Bowel sounds are normal. There is no tenderness. There is no guarding.  Musculoskeletal: Normal range of motion. She exhibits no edema.  Neurological: She is alert and oriented to person, place, and time. She has normal reflexes. No cranial nerve deficit. Coordination normal.  Skin: Skin is warm and dry. No rash noted. No erythema. No pallor.  Psychiatric: She has a normal mood and affect. Judgment normal.   Results for orders  placed or performed in visit on 06/23/17  Comprehensive metabolic panel  Result Value Ref Range   Glucose 169 (H) 65 - 99 mg/dL   BUN 15 6 - 24 mg/dL   Creatinine, Ser 0.54 (L) 0.57 - 1.00 mg/dL   GFR calc non Af Amer 115 >59 mL/min/1.73   GFR calc Af Amer 133 >59 mL/min/1.73   BUN/Creatinine Ratio 28 (H) 9 - 23   Sodium 144 134 - 144 mmol/L   Potassium 4.8 3.5 - 5.2 mmol/L   Chloride 102 96 - 106 mmol/L   CO2 25 20 - 29 mmol/L   Calcium 9.3 8.7 - 10.2 mg/dL   Total Protein 6.5 6.0 - 8.5 g/dL   Albumin 3.7 3.5 - 5.5 g/dL   Globulin, Total 2.8 1.5 - 4.5 g/dL   Albumin/Globulin Ratio 1.3 1.2 - 2.2   Bilirubin Total <0.2 0.0 - 1.2 mg/dL   Alkaline Phosphatase 75 39 - 117 IU/L   AST 15 0 - 40 IU/L   ALT 12 0 - 32 IU/L  Hgb A1c w/o eAG  Result Value Ref Range   Hgb A1c MFr Bld 7.4 (H) 4.8 - 5.6 %  Specimen status report  Result Value Ref Range   specimen status report Comment    Diabetic Labs (most recent): Lab Results  Component Value Date   HGBA1C 7.4 (H) 06/23/2017   HGBA1C 8.0 (H) 04/20/2017   HGBA1C 7.3 (H) 12/17/2016     Assessment & Plan:   1. 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 These are all discussed in detail with the patient.  Patient came with improving glucose profile, A1c improving to 7.4% from 8%.    -  she has history of very high A1c up to 14.7% in the past.    Recent labs reviewed.  - I have re-counseled the patient on diet management  by adopting a carbohydrate restricted / protein rich  Diet.  -  Suggestion is made for her to avoid simple carbohydrates  from her diet including Cakes, Sweet Desserts / Pastries, Ice Cream, Soda  (diet and regular), Sweet Tea, Candies, Chips, Cookies, Store Bought Juices, Alcohol in Excess of  1-2 drinks a day, Artificial Sweeteners, and "Sugar-free" Products. This will help patient to have  stable blood glucose profile and potentially avoid unintended weight gain.   - Patient is advised to stick to a routine mealtimes to eat 3 meals  a day and avoid unnecessary snacks ( to snack only to correct hypoglycemia).  - I have approached patient with the following individualized plan to manage diabetes and patient agrees.  - I advised her to continue Lantus 30 units nightly , associated with strict monitoring of blood glucose daily before breakfast and as needed.  -Based on her glycemic profile, she will not need prandial insulin at this time.    - I advised her to continue  Januvia 50 mg by mouth every morning with breakfast. Her blood pressure controlled to target.- She does not tolerate metformin.  - Patient specific target  for A1c; LDL, HDL, Triglycerides, and  Waist Circumference were discussed in detail.  2) BP/HTN: Her blood pressure is controlled to target.  Motion continue current medications. 3) Lipids/HPL:  continue statins. 4)  Weight/Diet:  exercise, and carbohydrates information provided.  5) Chronic Care/Health Maintenance:  -Patient is on  Statin medications and encouraged to continue to follow up with Ophthalmology, Podiatrist at least yearly or according to recommendations, and advised to quit smoking. I have recommended yearly flu vaccine and pneumonia vaccination at least every 5 years; moderate intensity exercise for up to 150 minutes weekly; and  sleep for at least 7 hours a day.  - I advised patient to maintain close follow up with [name], MD for primary care needs. - Time spent with the patient: 25 min, of which >50% was spent in reviewing her blood glucose logs , discussing her hypo- and hyper-glycemic episodes, reviewing her current and  previous labs and  insulin doses and developing a plan to avoid hypo- and hyper-glycemia. Please refer to Patient Instructions for Blood Glucose Monitoring and Insulin/Medications Dosing Guide"  in media tab for additional information.   Follow up plan: -Return in about 3 months (around 09/27/2017) for meter, and logs.   Glade Lloyd, MD Phone: [phone]  Fax: [phone]  -  This note was partially dictated with voice recognition software. Similar sounding words can be transcribed inadequately or may not  be corrected upon review.  06/30/2017, 7:40 PM

## 2017-07-03 ENCOUNTER — Encounter (HOSPITAL_COMMUNITY): Payer: Self-pay | Admitting: Orthopedic Surgery

## 2017-07-06 NOTE — Anesthesia Preprocedure Evaluation (Addendum)
Anesthesia Evaluation  Patient identified by MRN, date of birth, ID band Patient awake    Reviewed: Allergy & Precautions, H&P , NPO status , Patient's Chart, lab work & pertinent test results  Airway Mallampati: II  TM Distance: >3 FB Neck ROM: Full    Dental no notable dental hx. (+) Teeth Intact, Dental Advisory Given   Pulmonary Current Smoker,    Pulmonary exam normal breath sounds clear to auscultation       Cardiovascular Exercise Tolerance: Good hypertension, negative cardio ROS   Rhythm:Regular Rate:Normal     Neuro/Psych negative neurological ROS  negative psych ROS   GI/Hepatic negative GI ROS, Neg liver ROS,   Endo/Other  diabetes, Type 1, Insulin Dependent  Renal/GU negative Renal ROS  negative genitourinary   Musculoskeletal   Abdominal   Peds  Hematology negative hematology ROS (+)   Anesthesia Other Findings   Reproductive/Obstetrics negative OB ROS                            Anesthesia Physical Anesthesia Plan  ASA: III  Anesthesia Plan: General   Post-op Pain Management:    Induction: Intravenous  PONV Risk Score and Plan: 2 and Midazolam and Ondansetron  Airway Management Planned: LMA  Additional Equipment:   Intra-op Plan:   Post-operative Plan: Extubation in OR  Informed Consent: I have reviewed the patients History and Physical, chart, labs and discussed the procedure including the risks, benefits and alternatives for the proposed anesthesia with the patient or authorized representative who has indicated his/her understanding and acceptance.   Dental advisory given  Plan Discussed with: CRNA  Anesthesia Plan Comments:         Anesthesia Quick Evaluation

## 2017-07-07 ENCOUNTER — Ambulatory Visit (HOSPITAL_COMMUNITY)
Admission: RE | Admit: 2017-07-07 | Discharge: 2017-07-07 | Disposition: A | Discharge status: Home / Self Care | Payer: Medicaid Other | Source: Ambulatory Visit | Attending: Family Medicine | Admitting: Family Medicine

## 2017-07-07 ENCOUNTER — Ambulatory Visit (HOSPITAL_COMMUNITY): Payer: Medicaid Other | Admitting: Certified Registered"

## 2017-07-07 ENCOUNTER — Encounter (HOSPITAL_COMMUNITY)
Admission: RE | Disposition: A | Discharge status: Home / Self Care | Payer: Self-pay | Source: Ambulatory Visit | Attending: Family Medicine

## 2017-07-07 ENCOUNTER — Encounter (HOSPITAL_COMMUNITY): Payer: Self-pay

## 2017-07-07 ENCOUNTER — Other Ambulatory Visit: Payer: Self-pay

## 2017-07-07 DIAGNOSIS — R51 Headache: Secondary | ICD-10-CM | POA: Insufficient documentation

## 2017-07-07 DIAGNOSIS — E109 Type 1 diabetes mellitus without complications: Secondary | ICD-10-CM | POA: Insufficient documentation

## 2017-07-07 DIAGNOSIS — F17200 Nicotine dependence, unspecified, uncomplicated: Secondary | ICD-10-CM | POA: Insufficient documentation

## 2017-07-07 DIAGNOSIS — Z794 Long term (current) use of insulin: Secondary | ICD-10-CM | POA: Insufficient documentation

## 2017-07-07 DIAGNOSIS — I10 Essential (primary) hypertension: Secondary | ICD-10-CM | POA: Diagnosis not present

## 2017-07-07 DIAGNOSIS — G4489 Other headache syndrome: Secondary | ICD-10-CM

## 2017-07-07 HISTORY — PX: RADIOLOGY WITH ANESTHESIA: SHX6223

## 2017-07-07 HISTORY — DX: Cerebral cysts: G93.0

## 2017-07-07 LAB — GLUCOSE, CAPILLARY
Glucose-Capillary: 111 mg/dL — ABNORMAL HIGH (ref 65–99)
Glucose-Capillary: 69 mg/dL (ref 65–99)

## 2017-07-07 SURGERY — MRI WITH ANESTHESIA
Anesthesia: General

## 2017-07-07 MED ORDER — LIDOCAINE 2% (20 MG/ML) 5 ML SYRINGE
INTRAMUSCULAR | Status: DC | PRN
  Administered 2017-07-07: 60 mg via INTRAVENOUS

## 2017-07-07 MED ORDER — ONDANSETRON HCL 4 MG/2ML IJ SOLN
INTRAMUSCULAR | Status: AC
  Filled 2017-07-07: qty 2

## 2017-07-07 MED ORDER — LACTATED RINGERS IV SOLN
INTRAVENOUS | Status: DC
  Administered 2017-07-07: 07:00:00 via INTRAVENOUS

## 2017-07-07 MED ORDER — PROPOFOL 10 MG/ML IV BOLUS
INTRAVENOUS | Status: DC | PRN
  Administered 2017-07-07: 160 mg via INTRAVENOUS

## 2017-07-07 MED ORDER — ONDANSETRON HCL 4 MG/2ML IJ SOLN
INTRAMUSCULAR | Status: DC | PRN
  Administered 2017-07-07: 4 mg via INTRAVENOUS

## 2017-07-07 MED ORDER — MIDAZOLAM HCL 5 MG/5ML IJ SOLN
INTRAMUSCULAR | Status: DC | PRN
  Administered 2017-07-07: 2 mg via INTRAVENOUS

## 2017-07-07 NOTE — Transfer of Care (Signed)
Immediate Anesthesia Transfer of Care Note  Patient: Allison [name]  Procedure(s) Performed: MRI WITH ANESTHESIA,BRAIN WITHOUT CONTRAST (N/A )  Patient Location: PACU  Anesthesia Type:General  Level of Consciousness: awake, alert  and oriented  Airway & Oxygen Therapy: Patient Spontanous Breathing and Patient connected to nasal cannula oxygen  Post-op Assessment: Report given to RN and Post -op Vital signs reviewed and stable  Post vital signs: Reviewed and stable  Last Vitals:  Vitals:   07/07/17 0641 07/07/17 0910  BP: (!) 101/56 113/78  Pulse:  75  Resp:  15  Temp:  36.5 C  SpO2:  98%    Last Pain:  Vitals:   07/07/17 0910  TempSrc:   PainSc: (P) 0-No pain         Complications: No apparent anesthesia complications

## 2017-07-07 NOTE — Anesthesia Procedure Notes (Signed)
Procedure Name: LMA Insertion Date/Time: 07/07/2017 8:16 AM Performed by: Imagene Riches, CRNA Pre-anesthesia Checklist: Patient identified, Emergency Drugs available, Suction available and Patient being monitored Patient Re-evaluated:Patient Re-evaluated prior to induction Oxygen Delivery Method: Circle System Utilized Preoxygenation: Pre-oxygenation with 100% oxygen Induction Type: IV induction Ventilation: Mask ventilation without difficulty LMA: LMA inserted LMA Size: 4.0 Number of attempts: 1 Airway Equipment and Method: Bite block Placement Confirmation: positive ETCO2 Tube secured with: Tape Dental Injury: Teeth and Oropharynx as per pre-operative assessment

## 2017-07-07 NOTE — Anesthesia Postprocedure Evaluation (Signed)
Anesthesia Post Note  Patient: Allison [name]  Procedure(s) Performed: MRI WITH ANESTHESIA,BRAIN WITHOUT CONTRAST (N/A )     Patient location during evaluation: PACU Anesthesia Type: General Level of consciousness: awake and alert Pain management: pain level controlled Vital Signs Assessment: post-procedure vital signs reviewed and stable Respiratory status: spontaneous breathing, nonlabored ventilation and respiratory function stable Cardiovascular status: blood pressure returned to baseline and stable Postop Assessment: no apparent nausea or vomiting Anesthetic complications: no    Last Vitals:  Vitals:   07/07/17 0925 07/07/17 0938  BP: 119/77 113/75  Pulse: 74 83  Resp: 14 18  Temp:  36.6 C  SpO2: 96% 99%    Last Pain:  Vitals:   07/07/17 0910  TempSrc:   PainSc: 0-No pain                 Daly Whipkey,W. EDMOND

## 2017-07-08 ENCOUNTER — Encounter (HOSPITAL_COMMUNITY): Payer: Self-pay | Admitting: Radiology

## 2017-07-09 ENCOUNTER — Encounter (INDEPENDENT_AMBULATORY_CARE_PROVIDER_SITE_OTHER): Payer: Medicaid Other | Admitting: Ophthalmology

## 2017-07-09 DIAGNOSIS — E10311 Type 1 diabetes mellitus with unspecified diabetic retinopathy with macular edema: Secondary | ICD-10-CM | POA: Diagnosis not present

## 2017-07-09 DIAGNOSIS — I10 Essential (primary) hypertension: Secondary | ICD-10-CM | POA: Diagnosis not present

## 2017-07-09 DIAGNOSIS — H43813 Vitreous degeneration, bilateral: Secondary | ICD-10-CM

## 2017-07-09 DIAGNOSIS — E103513 Type 1 diabetes mellitus with proliferative diabetic retinopathy with macular edema, bilateral: Secondary | ICD-10-CM

## 2017-07-09 DIAGNOSIS — H35033 Hypertensive retinopathy, bilateral: Secondary | ICD-10-CM

## 2017-07-13 ENCOUNTER — Encounter (INDEPENDENT_AMBULATORY_CARE_PROVIDER_SITE_OTHER): Payer: Medicaid Other | Admitting: Ophthalmology

## 2017-07-23 ENCOUNTER — Other Ambulatory Visit: Payer: Self-pay | Admitting: "Endocrinology

## 2017-07-28 ENCOUNTER — Ambulatory Visit: Payer: Medicaid Other | Admitting: "Endocrinology

## 2017-08-18 ENCOUNTER — Encounter (INDEPENDENT_AMBULATORY_CARE_PROVIDER_SITE_OTHER): Payer: Medicaid Other | Admitting: Ophthalmology

## 2017-08-18 DIAGNOSIS — E11311 Type 2 diabetes mellitus with unspecified diabetic retinopathy with macular edema: Secondary | ICD-10-CM | POA: Diagnosis not present

## 2017-08-18 DIAGNOSIS — E113513 Type 2 diabetes mellitus with proliferative diabetic retinopathy with macular edema, bilateral: Secondary | ICD-10-CM | POA: Diagnosis not present

## 2017-08-18 DIAGNOSIS — I10 Essential (primary) hypertension: Secondary | ICD-10-CM

## 2017-08-18 DIAGNOSIS — H35033 Hypertensive retinopathy, bilateral: Secondary | ICD-10-CM

## 2017-08-18 DIAGNOSIS — H43813 Vitreous degeneration, bilateral: Secondary | ICD-10-CM

## 2017-09-16 NOTE — Progress Notes (Signed)
[facility] Note  09/17/2017     CHIEF COMPLAINT Patient presents for Retina Evaluation   HISTORY OF PRESENT ILLNESS: Allison [name] is a 47 y.o. female who presents to the clinic today for:   HPI    Retina Evaluation    In both eyes.  This started 5.  Associated Symptoms Negative for Blind Spot, Glare, Shoulder/Hip pain, Fatigue, Jaw Claudication, Photophobia, Distortion, Floaters, Redness, Scalp Tenderness, Weight Loss, Fever, Trauma, Pain and Flashes.  Context:  distance vision, mid-range vision and near vision.  Treatments tried include injection.  Response to treatment was moderate improvement.  I, the attending physician,  performed the HPI with the patient and updated documentation appropriately.          Comments    F/U Retina eval, Patient of DR. [name]. Patient states "she is having blurred vision, due to she is due to have Tricsence injection OD". Patient is scheduled every 6wks her next due inj is 09/29/17.Bs this am 147, Pt reports BS fluctuates, last A1C 7.3  ( 8 weeks ago)       Last edited by [name], MD on 09/17/2017 11:10 AM. (History)    Pt states she saw Dr. Katy Fitch yesterday due to blurred VA; Pt states she felt she was loosing VA; Pt states she called the office to leave a message for [place]; Pt states she "knows the swelling has come back";   Referring physician: Hayden Pedro, MD [address], [place] [address]  HISTORICAL INFORMATION:   Selected notes from the MEDICAL RECORD NUMBER Dr. [name] pt being seen for VA changes LEE- 03.26.19 Ocular Hx- PDR w/ edema OU, s/p IVT OU, s/p IVA OU, HTN retinopathy OU, cellophane OD, pseudophakia OU, s/p PRP OD, s/p Yag OS PMH- DM (on Januvia), HTN, glaucoma (on alphagan OU BID), ALLERGIC TO FA DYE    CURRENT MEDICATIONS: Current Outpatient Medications (Ophthalmic Drugs)  Medication Sig  . ALPHAGAN P 0.1 % SOLN Place 1 drop into both eyes 2 (two) times daily.  [name]  moxifloxacin (VIGAMOX) 0.5 % ophthalmic solution Place 1 drop into both eyes See admin instructions. Uses when having eye injections (every 6 weeks)   No current facility-administered medications for this visit.  (Ophthalmic Drugs)   Current Outpatient Medications (Other)  Medication Sig  . albuterol (PROAIR HFA) 108 (90 BASE) MCG/ACT inhaler Inhale 2 puffs into the lungs every 4 (four) hours as needed for wheezing or shortness of breath.  . ALPRAZolam (XANAX) 0.5 MG tablet Take 0.5 mg by mouth 4 (four) times daily as needed for anxiety.   [name] amphetamine-dextroamphetamine (ADDERALL) 10 MG tablet Take 10 mg by mouth 2 (two) times daily with a meal.  . aspirin EC 81 MG tablet Take 81 mg by mouth daily.  . blood glucose meter kit and supplies KIT Dispense based on patient and insurance preference. Use up to four times daily as directed. (FOR ICD 10 E11.65).  [name] conjugated estrogens (PREMARIN) vaginal cream Place 8.26 Applicatorfuls vaginally 3 (three) times a week.  . furosemide (LASIX) 20 MG tablet Take 20 mg by mouth 2 (two) times daily as needed.   [name] glucose blood (ACCU-CHEK AVIVA PLUS) test strip USE AS DIRECTED UP TO FOUR TIMES DAILY.  [name] HYDROcodone-acetaminophen (NORCO) 7.5-325 MG tablet Take 1 tablet by mouth every 4 (four) hours as needed for severe pain.  . Insulin Pen Needle (B-D ULTRAFINE III SHORT PEN) 31G X 8 MM MISC 1 each  by Does not apply route as directed.  [name] JANUVIA 50 MG tablet TAKE ONE TABLET BY MOUTH ONCE DAILY.  [name] LANTUS SOLOSTAR 100 UNIT/ML Solostar Pen INJECT 20 UNITS SUBCUTANEOUSLY AT 10 P.M.  . loratadine (CLARITIN) 10 MG tablet Take 10 mg by mouth daily.  . Melatonin 5 MG TABS Take 1 tablet by mouth at bedtime.  . Multiple Vitamin (MULTIVITAMIN) tablet Take 1 tablet by mouth daily.  [name] omeprazole (PRILOSEC) 20 MG capsule Take 20 mg by mouth 2 (two) times daily before a meal.   . polyethylene glycol (MIRALAX / GLYCOLAX) packet Take 17 g by mouth daily.  . potassium chloride  SA (K-DUR,KLOR-CON) 20 MEQ tablet Take 20 mEq by mouth 2 (two) times daily.    No current facility-administered medications for this visit.  (Other)      REVIEW OF SYSTEMS: ROS    Positive for: Endocrine, Cardiovascular, Eyes   Negative for: Constitutional, Gastrointestinal, Neurological, Skin, Genitourinary, Musculoskeletal, HENT, Respiratory, Psychiatric, Allergic/Imm, Heme/Lymph   Last edited by [name], LPN on 7/74/1423  9:53 AM. (History)       ALLERGIES Allergies  Allergen Reactions  . Cymbalta [Duloxetine Hcl] Shortness Of Breath  . Beta Adrenergic Blockers Palpitations  . Codeine Itching, Dermatitis and Other (See Comments)    TYLENOL #3   Welts and itching  . Lexapro [Escitalopram Oxalate] Hives and Palpitations  . Other Palpitations    Allergic to all beta blocker eye drops!!  . Doxycycline Other (See Comments)    UNSPECIFIED Non-specific Heart problems  . Oxycodone Itching    PAST MEDICAL HISTORY Past Medical History:  Diagnosis Date  . Brain cyst    x2  . Cancer (HCC)    cervical   . Dyslipidemia   . Gastroparesis    possible  . Glaucoma   . Hypertension   . Insulin dependent diabetes mellitus ([place])   . Neuropathy   . Retinopathy   . 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 ABDOMINAL HYSTERECTOMY     partial   . EYE SURGERY     multiple  . RADIOLOGY WITH ANESTHESIA N/A 07/07/2017   Procedure: MRI WITH ANESTHESIA,BRAIN WITHOUT CONTRAST;  Surgeon: Radiologist, Medication, MD;  Location: [facility];  Service: Radiology;  Laterality: N/A;    FAMILY HISTORY Family History  Problem Relation Age of Onset  . Diabetes Mother   . Hypertension Mother   . Cancer Father        brain   . Diabetes Father   . Stroke Father   . Iron deficiency Sister   . Hypertension Brother   . Hyperlipidemia Brother   . Cancer Other        family h/o  . Coronary artery disease Other        family h/o     SOCIAL HISTORY Social History   Tobacco Use  . Smoking status: Current Some Day Smoker    Packs/day: 0.25    Years: 26.00    Pack years: 6.50    Types: Cigarettes    Last attempt to quit: 05/05/2014    Years since quitting: 3.3  . Smokeless tobacco: Never Used  Substance Use Topics  . Alcohol use: No  . Drug use: No         OPHTHALMIC EXAM:  Base Eye Exam    Visual Acuity (Snellen - Linear)      Right Left   Dist cc 20/25 -  1 20/100 -2   Dist ph cc 20/20 -2 NI       Tonometry (Tonopen, 8:33 AM)      Right Left   Pressure 19 20       Pupils      Dark Light Shape React APD   Right 2 1.5 Round Slow None   Left 2 1.5 Round Slow None       Visual Fields (Counting fingers)      Left Right    Full Full       Extraocular Movement      Right Left    Full, Ortho Full, Ortho       Neuro/Psych    Oriented x3:  Yes   Mood/Affect:  Normal       Dilation    Both eyes:  1.0% Mydriacyl @ 8:33 AM        Slit Lamp and Fundus Exam    Slit Lamp Exam      Right Left   Lids/Lashes Normal Normal   Conjunctiva/Sclera White and quiet White and quiet   Cornea Clear Clear   Anterior Chamber Deep and quiet, no cell or flare Deep and quiet, no cell or flare   Iris Round and pooly dilated to 3.44m Round and pooly dilated to 3.216m  Lens Posterior chamber intraocular lens Posterior chamber intraocular lens   Vitreous Vitreous syneresis Vitreous syneresis       Fundus Exam      Right Left   Disc Nasal hyperemia, scattered Peripapillary atrophy Pink and Sharp   C/D Ratio 0.5 0.5   Macula Blunted foveal reflex, focal laser scars, scattered Microaneurysms, DBH, CWS Blunted foveal reflex, +edema, scattered focal laser scars, scattered Microaneurysms, DBH, Exudates   Vessels Vascular attenuation Vascular attenuation   Periphery Attached, 360 PRP Attached, 360 PRP, scattered Mas amongst laser          IMAGING AND PROCEDURES  Imaging and Procedures for  09/17/17  OCT, Retina - OU - Both Eyes       Right Eye Quality was good. Central Foveal Thickness: 237. Progression has improved. Findings include normal foveal contour, no IRF, no SRF, outer retinal atrophy, pigment epithelial detachment, epiretinal membrane.   Left Eye Quality was good. Central Foveal Thickness: 498. Progression has worsened. Findings include intraretinal fluid, abnormal foveal contour, outer retinal atrophy, no SRF.   Notes *Images captured and stored on drive  Diagnosis / Impression:  Interval improvement in DME OD Recurrent DME OS  Clinical management:  See below  Abbreviations: NFP - Normal foveal profile. CME - cystoid macular edema. PED - pigment epithelial detachment. IRF - intraretinal fluid. SRF - subretinal fluid. EZ - ellipsoid zone. ERM - epiretinal membrane. ORA - outer retinal atrophy. ORT - outer retinal tubulation. SRHM - subretinal hyper-reflective material         Intravitreal Injection, Pharmacologic Agent - OS - Left Eye       Time Out 09/17/2017. Confirmed correct patient, procedure, site, and patient consented.   Anesthesia Topical anesthesia was used. Anesthetic medications included Lidocaine 2%, Tetracaine 0.5%.   Procedure Preparation included 5% betadine to ocular surface, eyelid speculum. A supplied needle was used.   Injection: 4 mg triamcinolone acetonide 40 MG/ML   NDC: 008453-6468-03  Lot: [id number]  Expiration Date: 10/24/2018   Route: Intravitreal   Site: Left Eye   Waste: 0.9 mg  Post-op Post injection exam found visual acuity of at least counting fingers. The  patient tolerated the procedure well. There were no complications. The patient received written and verbal post procedure care education.                 ASSESSMENT/PLAN:    ICD-10-CM   1. Proliferative diabetic retinopathy of both eyes with macular edema associated with type 2 diabetes mellitus ([place]) B26.2035   2. Retinal edema H35.81 OCT, Retina  - OU - Both Eyes    Intravitreal Injection, Pharmacologic Agent - OS - Left Eye  3. Essential hypertension I10   4. Hypertensive retinopathy of both eyes H35.033   5. Pseudophakia of both eyes Z96.1     1,2. Proliferative diabetic retinopathy with DME OU - long standing pt of Dr. [name] with extensive history of intravitreal injections OU - s/p IVT OD 3.26.19, s/p IVT OS 2.14.19 - presents with acute decrease in vision OS x3 days - OCT shows recurrent diabetic macular edema, left eye  The natural history, pathology, and characteristics of diabetic macular edema discussed with patient.  A generalized discussion of the major clinical trials concerning treatment of diabetic macular edema (ETDRS, DCT, SCORE, RISE / RIDE, and ongoing DRCR net studies) was completed.  This discussion included mention of the various approaches to treating diabetic macular edema (observation, laser photocoagulation, anti-VEGF injections with lucentis / Avastin / Eylea, steroid injections with Kenalog / Ozurdex, and intraocular surgery with vitrectomy).  The goal hemoglobin A1C of 6-7 was discussed, as well as importance of smoking cessation and hypertension control.  Need for ongoing treatment and monitoring were specifically discussed with reference to chronic nature of diabetic macular edema. - recommend IV kenalog OS today, 4.25.19 - pt wishes to proceed - RBA of procedure discussed, questions answered - informed consent obtained and signed - see procedure note - f/u w/ Dr. [name] -- may need to reschedule upcoming appointment since received injection today  3,4. Hypertensive retinopathy OU - discussed importance of tight BP control - monitor  5. Pseudophakia OU - s/p CE/IOL OU  - OS [place]  - OD [place] - doing well - monitor   Ophthalmic Meds Ordered this visit:  No orders of the defined types were placed in this encounter.      No follow-ups on file.  There are no Patient Instructions on file  for this visit.   Explained the diagnoses, plan, and follow up with the patient and they expressed understanding.  Patient expressed understanding of the importance of proper follow up care.   This document serves as a record of services personally performed by Gardiner Sleeper, MD, PhD. It was created on their behalf by Catha Brow, [place], a certified ophthalmic assistant. The creation of this record is the provider's dictation and/or activities during the visit.  Electronically signed by: Catha Brow, [place]  09/17/17 11:24 AM   Gardiner Sleeper, M.D., Ph.D. Diseases & Surgery of the Retina and Vitreous [facility] 09/17/17   I have reviewed the above documentation for accuracy and completeness, and I agree with the above. Gardiner Sleeper, M.D., Ph.D. 09/17/17 11:24 AM     Abbreviations: M myopia (nearsighted); A astigmatism; H hyperopia (farsighted); P presbyopia; Mrx spectacle prescription;  CTL contact lenses; OD right eye; OS left eye; OU both eyes  XT exotropia; ET esotropia; PEK punctate epithelial keratitis; PEE punctate epithelial erosions; DES dry eye syndrome; MGD meibomian gland dysfunction; ATs artificial tears; PFAT's preservative free artificial tears; [place] nuclear sclerotic cataract; PSC posterior subcapsular cataract; ERM epi-retinal  membrane; PVD posterior vitreous detachment; RD retinal detachment; DM diabetes mellitus; DR diabetic retinopathy; NPDR non-proliferative diabetic retinopathy; PDR proliferative diabetic retinopathy; CSME clinically significant macular edema; DME diabetic macular edema; dbh dot blot hemorrhages; CWS cotton wool spot; POAG primary open angle glaucoma; C/D cup-to-disc ratio; HVF humphrey visual field; GVF goldmann visual field; OCT optical coherence tomography; IOP intraocular pressure; BRVO Branch retinal vein occlusion; CRVO central retinal vein occlusion; CRAO central retinal artery occlusion; BRAO branch retinal artery  occlusion; RT retinal tear; SB scleral buckle; PPV pars plana vitrectomy; VH Vitreous hemorrhage; PRP panretinal laser photocoagulation; IVK intravitreal kenalog; VMT vitreomacular traction; MH Macular hole;  NVD neovascularization of the disc; NVE neovascularization elsewhere; AREDS age related eye disease study; ARMD age related macular degeneration; POAG primary open angle glaucoma; EBMD epithelial/anterior basement membrane dystrophy; ACIOL anterior chamber intraocular lens; IOL intraocular lens; PCIOL posterior chamber intraocular lens; Phaco/IOL phacoemulsification with intraocular lens placement; [place] photorefractive keratectomy; LASIK laser assisted in situ keratomileusis; HTN hypertension; DM diabetes mellitus; COPD chronic obstructive pulmonary disease

## 2017-09-17 ENCOUNTER — Encounter (INDEPENDENT_AMBULATORY_CARE_PROVIDER_SITE_OTHER): Payer: Self-pay | Admitting: Ophthalmology

## 2017-09-17 ENCOUNTER — Ambulatory Visit (INDEPENDENT_AMBULATORY_CARE_PROVIDER_SITE_OTHER): Payer: Medicaid Other | Admitting: Ophthalmology

## 2017-09-17 DIAGNOSIS — H3581 Retinal edema: Secondary | ICD-10-CM | POA: Diagnosis not present

## 2017-09-17 DIAGNOSIS — I10 Essential (primary) hypertension: Secondary | ICD-10-CM

## 2017-09-17 DIAGNOSIS — H35033 Hypertensive retinopathy, bilateral: Secondary | ICD-10-CM | POA: Diagnosis not present

## 2017-09-17 DIAGNOSIS — Z961 Presence of intraocular lens: Secondary | ICD-10-CM | POA: Diagnosis not present

## 2017-09-17 DIAGNOSIS — E113513 Type 2 diabetes mellitus with proliferative diabetic retinopathy with macular edema, bilateral: Secondary | ICD-10-CM | POA: Diagnosis not present

## 2017-09-17 MED ORDER — TRIAMCINOLONE ACETONIDE 40 MG/ML IO SUSP
4.0000 mg | INTRAOCULAR | Status: AC
  Administered 2017-09-17: 4 mg via INTRAVITREAL

## 2017-09-23 ENCOUNTER — Other Ambulatory Visit: Payer: Self-pay | Admitting: "Endocrinology

## 2017-09-24 LAB — COMPREHENSIVE METABOLIC PANEL
ALBUMIN: 4.4 g/dL (ref 3.5–5.5)
ALT: 12 IU/L (ref 0–32)
AST: 12 IU/L (ref 0–40)
Albumin/Globulin Ratio: 1.6 (ref 1.2–2.2)
Alkaline Phosphatase: 84 IU/L (ref 39–117)
BILIRUBIN TOTAL: 0.2 mg/dL (ref 0.0–1.2)
BUN / CREAT RATIO: 19 (ref 9–23)
BUN: 13 mg/dL (ref 6–24)
CALCIUM: 9.6 mg/dL (ref 8.7–10.2)
CO2: 24 mmol/L (ref 20–29)
CREATININE: 0.7 mg/dL (ref 0.57–1.00)
Chloride: 100 mmol/L (ref 96–106)
GFR, EST AFRICAN AMERICAN: 121 mL/min/{1.73_m2} (ref >59)
GFR, EST NON AFRICAN AMERICAN: 105 mL/min/{1.73_m2} (ref >59)
GLUCOSE: 198 mg/dL — AB (ref 65–99)
Globulin, Total: 2.8 g/dL (ref 1.5–4.5)
Potassium: 4.5 mmol/L (ref 3.5–5.2)
Sodium: 139 mmol/L (ref 134–144)
TOTAL PROTEIN: 7.2 g/dL (ref 6.0–8.5)

## 2017-09-24 LAB — SPECIMEN STATUS REPORT

## 2017-09-24 LAB — HGB A1C W/O EAG: Hgb A1c MFr Bld: 8.1 % — ABNORMAL HIGH (ref 4.8–5.6)

## 2017-09-29 ENCOUNTER — Ambulatory Visit: Payer: Medicaid Other | Admitting: "Endocrinology

## 2017-09-29 ENCOUNTER — Encounter: Payer: Self-pay | Admitting: "Endocrinology

## 2017-09-29 VITALS — BP 136/82 | HR 74 | Ht 63.5 in | Wt 154.0 lb

## 2017-09-29 DIAGNOSIS — E782 Mixed hyperlipidemia: Secondary | ICD-10-CM | POA: Diagnosis not present

## 2017-09-29 DIAGNOSIS — I10 Essential (primary) hypertension: Secondary | ICD-10-CM | POA: Diagnosis not present

## 2017-09-29 DIAGNOSIS — Z794 Long term (current) use of insulin: Secondary | ICD-10-CM | POA: Diagnosis not present

## 2017-09-29 DIAGNOSIS — E11311 Type 2 diabetes mellitus with unspecified diabetic retinopathy with macular edema: Secondary | ICD-10-CM

## 2017-09-29 MED ORDER — INSULIN GLARGINE 100 UNIT/ML SOLOSTAR PEN: 40.0000 [IU] | PEN_INJECTOR | Freq: Every day | SUBCUTANEOUS | 2 refills | Status: DC

## 2017-09-29 NOTE — Patient Instructions (Signed)

## 2017-09-29 NOTE — Progress Notes (Signed)
Subjective:    Patient ID: Allison [name], female    DOB: January 16, 1971, PCP [name], MD   Past Medical History:  Diagnosis Date  . Brain cyst    x2  . Cancer (HCC)    cervical   . Dyslipidemia   . Gastroparesis    possible  . Glaucoma   . Hypertension   . Insulin dependent diabetes mellitus ([place])   . Neuropathy   . Retinopathy   . 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 ABDOMINAL HYSTERECTOMY     partial   . EYE SURGERY     multiple  . RADIOLOGY WITH ANESTHESIA N/A 07/07/2017   Procedure: MRI WITH ANESTHESIA,BRAIN WITHOUT CONTRAST;  Surgeon: Radiologist, Medication, MD;  Location: [facility];  Service: Radiology;  Laterality: N/A;   Social History   Socioeconomic History  . Marital status: Divorced    Spouse name: Not on file  . Number of children: Not on file  . Years of education: Not on file  . Highest education level: Not on file  Occupational History  . Not on file  Social Needs  . Financial resource strain: Not on file  . Food insecurity:    Worry: Not on file    Inability: Not on file  . Transportation needs:    Medical: Not on file    Non-medical: Not on file  Tobacco Use  . Smoking status: Current Some Day Smoker    Packs/day: 0.25    Years: 26.00    Pack years: 6.50    Types: Cigarettes    Last attempt to quit: 05/05/2014    Years since quitting: 3.4  . Smokeless tobacco: Never Used  Substance and Sexual Activity  . Alcohol use: No  . Drug use: No  . Sexual activity: Yes    Birth control/protection: Surgical  Lifestyle  . Physical activity:    Days per week: Not on file    Minutes per session: Not on file  . Stress: Not on file  Relationships  . Social connections:    Talks on phone: Not on file    Gets together: Not on file    Attends religious service: Not on file    Active member of club or organization: Not on file    Attends meetings of clubs or organizations: Not on  file    Relationship status: Not on file  Other Topics Concern  . Not on file  Social History Narrative  . Not on file   Outpatient Encounter Medications as of 09/29/2017  Medication Sig  . albuterol (PROAIR HFA) 108 (90 BASE) MCG/ACT inhaler Inhale 2 puffs into the lungs every 4 (four) hours as needed for wheezing or shortness of breath.  . ALPHAGAN P 0.1 % SOLN Place 1 drop into both eyes 2 (two) times daily.  [name] ALPRAZolam (XANAX) 0.5 MG tablet Take 0.5 mg by mouth 4 (four) times daily as needed for anxiety.   [name] amphetamine-dextroamphetamine (ADDERALL) 10 MG tablet Take 10 mg by mouth 2 (two) times daily with a meal.  . aspirin EC 81 MG tablet Take 81 mg by mouth daily.  . blood glucose meter kit and supplies KIT Dispense based on patient and insurance preference. Use up to four times daily as directed. (FOR ICD 10 E11.65).  [name] conjugated estrogens (PREMARIN) vaginal cream Place 8.25 Applicatorfuls vaginally 3 (three) times a week.  . furosemide (LASIX) 20 MG  tablet Take 20 mg by mouth 2 (two) times daily as needed.   [name] glucose blood (ACCU-CHEK AVIVA PLUS) test strip USE AS DIRECTED UP TO FOUR TIMES DAILY.  [name] HYDROcodone-acetaminophen (NORCO) 7.5-325 MG tablet Take 1 tablet by mouth every 4 (four) hours as needed for severe pain.  . Insulin Glargine (LANTUS SOLOSTAR) 100 UNIT/ML Solostar Pen Inject 40 Units into the skin daily at 10 pm.  . Insulin Pen Needle (B-D ULTRAFINE III SHORT PEN) 31G X 8 MM MISC 1 each by Does not apply route as directed.  [name] JANUVIA 50 MG tablet TAKE ONE TABLET BY MOUTH ONCE DAILY.  [name] loratadine (CLARITIN) 10 MG tablet Take 10 mg by mouth daily.  . Melatonin 5 MG TABS Take 1 tablet by mouth at bedtime.  . moxifloxacin (VIGAMOX) 0.5 % ophthalmic solution Place 1 drop into both eyes See admin instructions. Uses when having eye injections (every 6 weeks)  . Multiple Vitamin (MULTIVITAMIN) tablet Take 1 tablet by mouth daily.  [name] omeprazole (PRILOSEC) 20 MG capsule Take  20 mg by mouth 2 (two) times daily before a meal.   . polyethylene glycol (MIRALAX / GLYCOLAX) packet Take 17 g by mouth daily.  . potassium chloride SA (K-DUR,KLOR-CON) 20 MEQ tablet Take 20 mEq by mouth 2 (two) times daily.   . [DISCONTINUED] LANTUS SOLOSTAR 100 UNIT/ML Solostar Pen INJECT 20 UNITS SUBCUTANEOUSLY AT 10 P.M. (Patient taking differently: INJECT 30 UNITS SUBCUTANEOUSLY AT 10 P.M.)   Facility-Administered Encounter Medications as of 09/29/2017  Medication  . triamcinolone acetonide (TRIESENCE) 40 MG/ML subtenons injection 4 mg   ALLERGIES: Allergies  Allergen Reactions  . Cymbalta [Duloxetine Hcl] Shortness Of Breath  . Beta Adrenergic Blockers Palpitations  . Codeine Itching, Dermatitis and Other (See Comments)    TYLENOL #3   Welts and itching  . Lexapro [Escitalopram Oxalate] Hives and Palpitations  . Other Palpitations    Allergic to all beta blocker eye drops!!  . Doxycycline Other (See Comments)    UNSPECIFIED Non-specific Heart problems  . Oxycodone Itching   VACCINATION STATUS:  There is no immunization history on file for this patient.  Diabetes  She presents for her follow-up diabetic visit. She has type 2 diabetes mellitus. Onset time: She was diagnosed at approximate age of 41 years. Her disease course has been worsening. There are no hypoglycemic associated symptoms. Pertinent negatives for hypoglycemia include no confusion, headaches, pallor or seizures. Associated symptoms include blurred vision. Pertinent negatives for diabetes include no chest pain, no polydipsia, no polyphagia and no polyuria. There are no hypoglycemic complications. Symptoms are worsening. Diabetic complications include retinopathy. Risk factors for coronary artery disease include diabetes mellitus, dyslipidemia, hypertension, tobacco exposure and sedentary lifestyle. Current diabetic treatment includes insulin injections and oral agent (monotherapy). She is compliant with treatment most  of the time. Her weight is stable. She is following a generally unhealthy diet. When asked about meal planning, she reported none. She never participates in exercise. There is no change in her home blood glucose trend. Her overall blood glucose range is 180-200 mg/dl. Eye exam is current.  Hyperlipidemia  This is a chronic problem. The current episode started more than 1 year ago. The problem is controlled. Exacerbating diseases include diabetes. Pertinent negatives include no chest pain, myalgias or shortness of breath. Risk factors for coronary artery disease include diabetes mellitus, dyslipidemia, hypertension and a sedentary lifestyle.  Hypertension  This is a chronic problem. The current episode started more than 1 year ago. The problem  is controlled. Associated symptoms include blurred vision. Pertinent negatives include no chest pain, headaches, palpitations or shortness of breath. Risk factors for coronary artery disease include dyslipidemia, diabetes mellitus, obesity, sedentary lifestyle and smoking/tobacco exposure. Hypertensive end-organ damage includes retinopathy.     Review of Systems  Constitutional: Negative for chills, fever and unexpected weight change.  HENT: Negative for trouble swallowing and voice change.   Eyes: Positive for blurred vision. Negative for visual disturbance.  Respiratory: Negative for cough, shortness of breath and wheezing.   Cardiovascular: Negative for chest pain, palpitations and leg swelling.  Gastrointestinal: Negative for diarrhea, nausea and vomiting.  Endocrine: Negative for cold intolerance, heat intolerance, polydipsia, polyphagia and polyuria.  Musculoskeletal: Negative for arthralgias and myalgias.  Skin: Negative for color change, pallor, rash and wound.  Neurological: Negative for seizures and headaches.  Psychiatric/Behavioral: Negative for confusion and suicidal ideas.    Objective:    BP 136/82   Pulse 74   Ht 5' 3.5" (1.613 m)   Wt  154 lb (69.9 kg)   BMI 26.85 kg/m   Wt Readings from Last 3 Encounters:  09/29/17 154 lb (69.9 kg)  07/07/17 157 lb (71.2 kg)  06/30/17 157 lb (71.2 kg)    Physical Exam  Constitutional: She is oriented to person, place, and time. She appears well-developed.  HENT:  Head: Normocephalic and atraumatic.  Eyes: EOM are normal.  Neck: Normal range of motion. Neck supple. No tracheal deviation present. No thyromegaly present.  Cardiovascular: Normal rate.  Pulmonary/Chest: Effort normal.  Abdominal: There is no tenderness. There is no guarding.  Musculoskeletal: Normal range of motion. She exhibits no edema.  Neurological: She is alert and oriented to person, place, and time. She has normal reflexes. No cranial nerve deficit. Coordination normal.  Skin: Skin is warm and dry. No rash noted. No erythema. No pallor.  Psychiatric: She has a normal mood and affect. Judgment normal.   Results for orders placed or performed in visit on 09/23/17  Comprehensive metabolic panel  Result Value Ref Range   Glucose 198 (H) 65 - 99 mg/dL   BUN 13 6 - 24 mg/dL   Creatinine, Ser 0.70 0.57 - 1.00 mg/dL   GFR calc non Af Amer 105 >59 mL/min/1.73   GFR calc Af Amer 121 >59 mL/min/1.73   BUN/Creatinine Ratio 19 9 - 23   Sodium 139 134 - 144 mmol/L   Potassium 4.5 3.5 - 5.2 mmol/L   Chloride 100 96 - 106 mmol/L   CO2 24 20 - 29 mmol/L   Calcium 9.6 8.7 - 10.2 mg/dL   Total Protein 7.2 6.0 - 8.5 g/dL   Albumin 4.4 3.5 - 5.5 g/dL   Globulin, Total 2.8 1.5 - 4.5 g/dL   Albumin/Globulin Ratio 1.6 1.2 - 2.2   Bilirubin Total 0.2 0.0 - 1.2 mg/dL   Alkaline Phosphatase 84 39 - 117 IU/L   AST 12 0 - 40 IU/L   ALT 12 0 - 32 IU/L  Hgb A1c w/o eAG  Result Value Ref Range   Hgb A1c MFr Bld 8.1 (H) 4.8 - 5.6 %  Specimen status report  Result Value Ref Range   specimen status report Comment    Diabetic Labs (most recent): Lab Results  Component Value Date   HGBA1C 8.1 (H) 09/23/2017   HGBA1C 7.4 (H)  06/23/2017   HGBA1C 8.0 (H) 04/20/2017   Lipid Panel     Component Value Date/Time   CHOL 147 04/20/2017 0943   TRIG  182 (H) 04/20/2017 0943   HDL 41 04/20/2017 0943   LDLCALC 70 04/20/2017 0943     Assessment & Plan:   1. 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 These are all discussed in detail with the patient.  Patient came with worsening glucose profile, A1c increasing to 8.1% from 7.4%.  -  she has history of very high A1c up to 14.7% in the past.    Recent labs reviewed.  - I have re-counseled the patient on diet management  by adopting a carbohydrate restricted / protein rich  Diet.  -  Suggestion is made for her to avoid simple carbohydrates  from her diet including Cakes, Sweet Desserts / Pastries, Ice Cream, Soda (diet and regular), Sweet Tea, Candies, Chips, Cookies, Store Bought Juices, Alcohol in Excess of  1-2 drinks a day, Artificial Sweeteners, and "Sugar-free" Products. This will help patient to have stable blood glucose profile and potentially avoid unintended weight gain.   - Patient is advised to stick to a routine mealtimes to eat 3 meals  a day and avoid unnecessary snacks ( to snack only to correct hypoglycemia).  - I have approached patient with the following individualized plan to manage diabetes and patient agrees.  - I advised her to increase Lantus to 40 units nightly, associated with strict monitoring of blood glucose daily before breakfast and as needed.  -If she continues to lose control, she will be considered for prandial insulin at next visit.    - I advised her to continue  Januvia 50 mg by mouth every morning with breakfast. -  She does not tolerate metformin.  - Patient specific target  for A1c; LDL, HDL, Triglycerides, and  Waist Circumference were discussed in detail.  2) BP/HTN: Her blood pressure is controlled to target.  3) Lipids/HPL: Her recent lipid panel showed controlled LDL at 70.  She is not on statins.  4)  Weight/Diet:  exercise, and carbohydrates information provided.  5) Chronic Care/Health Maintenance:  -Patient is on  Statin medications and encouraged to continue to follow up with Ophthalmology, Podiatrist at least yearly or according to recommendations, and advised to quit smoking. I have recommended yearly flu vaccine and pneumonia vaccination at least every 5 years; moderate intensity exercise for up to 150 minutes weekly; and  sleep for at least 7 hours a day.  - I advised patient to maintain close follow up with [name], MD for primary care needs.  - Time spent with the patient: 25 min, of which >50% was spent in reviewing her blood glucose logs , discussing her hypo- and hyper-glycemic episodes, reviewing her current and  previous labs and insulin doses and developing a plan to avoid hypo- and hyper-glycemia. Please refer to Patient Instructions for Blood Glucose Monitoring and Insulin/Medications Dosing Guide"  in media tab for additional information. Stephens Shire participated in the discussions, expressed understanding, and voiced agreement with the above plans.  All questions were answered to her satisfaction. she is encouraged to contact clinic should she have any questions or concerns prior to her return visit.  Follow up plan: -Return in about 3 months (around 12/30/2017) for follow up with pre-visit labs, meter, and logs.   Glade Lloyd,  MD Phone: [phone]  Fax: [phone]  -  This note was partially dictated with voice recognition software. Similar sounding words can be transcribed inadequately or may not  be corrected upon review.  09/29/2017, 8:58 AM

## 2017-09-30 ENCOUNTER — Encounter (INDEPENDENT_AMBULATORY_CARE_PROVIDER_SITE_OTHER): Payer: Medicaid Other | Admitting: Ophthalmology

## 2017-09-30 DIAGNOSIS — E103513 Type 1 diabetes mellitus with proliferative diabetic retinopathy with macular edema, bilateral: Secondary | ICD-10-CM | POA: Diagnosis not present

## 2017-09-30 DIAGNOSIS — I10 Essential (primary) hypertension: Secondary | ICD-10-CM

## 2017-09-30 DIAGNOSIS — H35033 Hypertensive retinopathy, bilateral: Secondary | ICD-10-CM | POA: Diagnosis not present

## 2017-09-30 DIAGNOSIS — H43813 Vitreous degeneration, bilateral: Secondary | ICD-10-CM

## 2017-09-30 DIAGNOSIS — E10311 Type 1 diabetes mellitus with unspecified diabetic retinopathy with macular edema: Secondary | ICD-10-CM | POA: Diagnosis not present

## 2017-11-04 NOTE — Progress Notes (Signed)
[facility] Note  11/05/2017     CHIEF COMPLAINT Patient presents for Retina Follow Up   HISTORY OF PRESENT ILLNESS: Allison [name] is a 47 y.o. female who presents to the clinic today for:   HPI    Retina Follow Up    Patient presents with  Diabetic Retinopathy.  In both eyes.  This started 1 year ago.  Severity is mild.  Since onset it is gradually worsening.  I, the attending physician,  performed the HPI with the patient and updated documentation appropriately.          Comments    F/U DME OU. Patient states appx five months ago she noticed her vision becoming worse OD(blurry)."I can't see images". Bs have been fluctuating since her InVokana was changed to [place]. Bs this am 147 A1C 8.0 (3 wks ago).Pt is using Alphagan gtt's Bid.   Pt is ready for Tx if indicated.         Last edited by Allison Caffey, MD on 11/05/2017  9:35 AM. (History)      Referring physician: [name], MD [place], [place] [address]  HISTORICAL INFORMATION:   Selected notes from the MEDICAL RECORD NUMBER Dr. Zigmund [name] pt being seen for VA changes LEE- 03.26.19 Ocular Hx- PDR w/ edema OU, s/p IVT OU, s/p IVA OU, HTN retinopathy OU, cellophane OD, pseudophakia OU, s/p PRP OD, s/p Yag OS PMH- DM (on Januvia), HTN, glaucoma (on alphagan OU BID), ALLERGIC TO FA DYE    CURRENT MEDICATIONS: Current Outpatient Medications (Ophthalmic Drugs)  Medication Sig  . ALPHAGAN P 0.1 % SOLN Place 1 drop into both eyes 2 (two) times daily.  [name] moxifloxacin (VIGAMOX) 0.5 % ophthalmic solution Place 1 drop into both eyes See admin instructions. Uses when having eye injections (every 6 weeks)   Current Facility-Administered Medications (Ophthalmic Drugs)  Medication Route  . triamcinolone acetonide (TRIESENCE) 40 MG/ML subtenons injection 4 mg Intravitreal  . triamcinolone acetonide (TRIESENCE) 40 MG/ML subtenons injection 4 mg Intravitreal   Current Outpatient  Medications (Other)  Medication Sig  . albuterol (PROAIR HFA) 108 (90 BASE) MCG/ACT inhaler Inhale 2 puffs into the lungs every 4 (four) hours as needed for wheezing or shortness of breath.  . ALPRAZolam (XANAX) 0.5 MG tablet Take 0.5 mg by mouth 4 (four) times daily as needed for anxiety.   [name] amphetamine-dextroamphetamine (ADDERALL) 10 MG tablet Take 10 mg by mouth 2 (two) times daily with a meal.  . aspirin EC 81 MG tablet Take 81 mg by mouth daily.  . blood glucose meter kit and supplies KIT Dispense based on patient and insurance preference. Use up to four times daily as directed. (FOR ICD 10 E11.65).  [name] conjugated estrogens (PREMARIN) vaginal cream Place 3.87 Applicatorfuls vaginally 3 (three) times a week.  . furosemide (LASIX) 20 MG tablet Take 20 mg by mouth 2 (two) times daily as needed.   [name] glucose blood (ACCU-CHEK AVIVA PLUS) test strip USE AS DIRECTED UP TO FOUR TIMES DAILY.  [name] HYDROcodone-acetaminophen (NORCO) 7.5-325 MG tablet Take 1 tablet by mouth every 4 (four) hours as needed for severe pain.  . Insulin Glargine (LANTUS SOLOSTAR) 100 UNIT/ML Solostar Pen Inject 40 Units into the skin daily at 10 pm.  . Insulin Pen Needle (B-D ULTRAFINE III SHORT PEN) 31G X 8 MM MISC 1 each by Does not apply route as directed.  [name] JANUVIA 50 MG tablet TAKE ONE TABLET BY MOUTH ONCE DAILY.  [name]  loratadine (CLARITIN) 10 MG tablet Take 10 mg by mouth daily.  . Melatonin 5 MG TABS Take 1 tablet by mouth at bedtime.  . Multiple Vitamin (MULTIVITAMIN) tablet Take 1 tablet by mouth daily.  [name] omeprazole (PRILOSEC) 20 MG capsule Take 20 mg by mouth 2 (two) times daily before a meal.   . polyethylene glycol (MIRALAX / GLYCOLAX) packet Take 17 g by mouth daily.  . potassium chloride SA (K-DUR,KLOR-CON) 20 MEQ tablet Take 20 mEq by mouth 2 (two) times daily.    No current facility-administered medications for this visit.  (Other)      REVIEW OF SYSTEMS: ROS    Positive for: Genitourinary, Endocrine, Eyes    Negative for: Constitutional, Gastrointestinal, Neurological, Skin, Musculoskeletal, HENT, Cardiovascular, Respiratory, Psychiatric, Allergic/Imm, Heme/Lymph   Last edited by Allison Jordan, LPN on 7/91/5056  9:79 AM. (History)       ALLERGIES Allergies  Allergen Reactions  . Cymbalta [Duloxetine Hcl] Shortness Of Breath  . Beta Adrenergic Blockers Palpitations  . Codeine Itching, Dermatitis and Other (See Comments)    TYLENOL #3   Welts and itching  . Lexapro [Escitalopram Oxalate] Hives and Palpitations  . Other Palpitations    Allergic to all beta blocker eye drops!!  . Doxycycline Other (See Comments)    UNSPECIFIED Non-specific Heart problems  . Oxycodone Itching    PAST MEDICAL HISTORY Past Medical History:  Diagnosis Date  . Brain cyst    x2  . Cancer (HCC)    cervical   . Dyslipidemia   . Gastroparesis    possible  . Glaucoma   . Hypertension   . Insulin dependent diabetes mellitus ([place])   . Neuropathy   . Retinopathy   . 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 ABDOMINAL HYSTERECTOMY     partial   . EYE SURGERY     multiple  . RADIOLOGY WITH ANESTHESIA N/A 07/07/2017   Procedure: MRI WITH ANESTHESIA,BRAIN WITHOUT CONTRAST;  Surgeon: Radiologist, Medication, MD;  Location: [facility];  Service: Radiology;  Laterality: N/A;    FAMILY HISTORY Family History  Problem Relation Age of Onset  . Diabetes Mother   . Hypertension Mother   . Cancer Father        brain   . Diabetes Father   . Stroke Father   . Iron deficiency Sister   . Hypertension Brother   . Hyperlipidemia Brother   . Cancer Other        family h/o  . Coronary artery disease Other        family h/o    SOCIAL HISTORY Social History   Tobacco Use  . Smoking status: Current Some Day Smoker    Packs/day: 0.25    Years: 26.00    Pack years: 6.50    Types: Cigarettes    Last attempt to quit: 05/05/2014    Years since  quitting: 3.5  . Smokeless tobacco: Never Used  Substance Use Topics  . Alcohol use: No  . Drug use: No         OPHTHALMIC EXAM:  Base Eye Exam    Visual Acuity (Snellen - Linear)      Right Left   Dist cc 20/50 20/30 -2   Dist ph cc 20/40 -2 20/30 +1   Correction:  Glasses       Tonometry (Tonopen, 9:01 AM)      Right Left   Pressure 15  18       Pupils      Dark Light Shape React APD   Right 3 2 Round Brisk None   Left 3 2 Round Brisk None       Visual Fields (Counting fingers)      Left Right    Full Full       Extraocular Movement      Right Left    Full, Ortho Full, Ortho       Neuro/Psych    Oriented x3:  Yes   Mood/Affect:  Normal       Dilation    Both eyes:  1.0% Mydriacyl @ 9:01 AM        Slit Lamp and Fundus Exam    Slit Lamp Exam      Right Left   Lids/Lashes Normal Normal   Conjunctiva/Sclera White and quiet White and quiet   Cornea Clear Clear   Anterior Chamber Deep and quiet, no cell or flare Deep and quiet, no cell or flare   Iris Round and poorly dilated to 3.71m Round and poorly dilated to 3.270m  Lens Posterior chamber intraocular lens Posterior chamber intraocular lens   Vitreous Vitreous syneresis Vitreous syneresis, Posterior vitreous detachment       Fundus Exam      Right Left   Disc Nasal hyperemia, scattered Peripapillary atrophy Pink and Sharp, disc hemorrhage at 0300   C/D Ratio 0.5 0.5   Macula Blunted foveal reflex, focal laser scars, scattered Microaneurysms, DBH, CWS, massive central exudation Good foveal reflex, scattered focal laser scars, Microaneurysms   Vessels Vascular attenuation Vascular attenuation   Periphery Attached, 360 PRP, scattered MAs / DHElsattached, 360 PRP, scattered MAs / DBH amongst laser,           IMAGING AND PROCEDURES  Imaging and Procedures for 09/17/17  OCT, Retina - OU - Both Eyes       Right Eye Quality was good. Central Foveal Thickness: 683. Progression has worsened.  Findings include outer retinal atrophy, pigment epithelial detachment, epiretinal membrane, intraretinal fluid, subretinal fluid, abnormal foveal contour.   Left Eye Quality was good. Central Foveal Thickness: 226. Progression has been stable. Findings include outer retinal atrophy, no SRF, normal foveal contour, no IRF.   Notes *Images captured and stored on drive  Diagnosis / Impression:  Interval worsening in DME OD DME improved OS  Clinical management:  See below  Abbreviations: NFP - Normal foveal profile. CME - cystoid macular edema. PED - pigment epithelial detachment. IRF - intraretinal fluid. SRF - subretinal fluid. EZ - ellipsoid zone. ERM - epiretinal membrane. ORA - outer retinal atrophy. ORT - outer retinal tubulation. SRHM - subretinal hyper-reflective material         Intravitreal Injection, Pharmacologic Agent - OD - Right Eye       Time Out 11/05/2017. 10:06 AM. Confirmed correct patient, procedure, site, and patient consented.   Anesthesia Topical anesthesia was used. Anesthetic medications included Lidocaine 2%, Tetracaine 0.5%.   Procedure Preparation included 5% betadine to ocular surface, eyelid speculum.   Injection: 4 mg triamcinolone acetonide 40 MG/ML   NDC: 008101-7510-25  Lot: [id number]    Expiration Date: 04/25/2019   Route: Intravitreal   Site: Right Eye   Waste: 0.9 mg  Post-op Post injection exam found visual acuity of at least counting fingers. The patient tolerated the procedure well. There were no complications. The patient received written and verbal post procedure care education.  ASSESSMENT/PLAN:    ICD-10-CM   1. Proliferative diabetic retinopathy of both eyes with macular edema associated with type 2 diabetes mellitus (HCC) F02.6378 Intravitreal Injection, Pharmacologic Agent - OD - Right Eye    triamcinolone acetonide (TRIESENCE) 40 MG/ML subtenons injection 4 mg  2. Retinal edema H35.81 OCT, Retina - OU -  Both Eyes  3. Essential hypertension I10   4. Hypertensive retinopathy of both eyes H35.033   5. Pseudophakia of both eyes Z96.1     1,2. Proliferative diabetic retinopathy with DME OU - long standing pt of Dr. Zigmund [name] with extensive history of intravitreal injections OU - s/p IVT OD 5.8.19, 3.26.19, s/p IVT OS 2.14.19 (JDM) - S/P IVT OS (04.25.19) -- BZ - presents with acute decrease in vision OD - OCT shows recurrent diabetic macular edema, right eye  - recommend IV Triesence OD today (06.13.19) - pt wishes to proceed - RBA of procedure discussed, questions answered - informed consent obtained and signed - see procedure note - f/u 4-6 weeks  3,4. Hypertensive retinopathy OU - discussed importance of tight BP control - monitor  5. Pseudophakia OU - s/p CE/IOL OU  - OS [place]  - OD [place] - doing well - monitor   Ophthalmic Meds Ordered this visit:  Meds ordered this encounter  Medications  . triamcinolone acetonide (TRIESENCE) 40 MG/ML subtenons injection 4 mg       Return in about 1 month (around 12/03/2017) for F/U PDR OU, DFE, OCT.  There are no Patient Instructions on file for this visit.   Explained the diagnoses, plan, and follow up with the patient and they expressed understanding.  Patient expressed understanding of the importance of proper follow up care.   This document serves as a record of services personally performed by Gardiner Sleeper, MD, PhD. It was created on their behalf by Ernest Mallick, OA, an ophthalmic assistant. The creation of this record is the provider's dictation and/or activities during the visit.    Electronically signed by: Ernest Mallick, OA  06.12.2019 12:28 AM   This document serves as a record of services personally performed by Gardiner Sleeper, MD, PhD. It was created on their behalf by Catha Brow, [place], a certified ophthalmic assistant. The creation of this record is the provider's dictation and/or activities during the  visit.  Electronically signed by: Catha Brow, [place]  06.13.19 12:28 AM   Gardiner Sleeper, M.D., Ph.D. Diseases & Surgery of the Retina and Vitreous [facility]  I have reviewed the above documentation for accuracy and completeness, and I agree with the above. Gardiner Sleeper, M.D., Ph.D. 11/09/17 12:31 AM   Abbreviations: M myopia (nearsighted); A astigmatism; H hyperopia (farsighted); P presbyopia; Mrx spectacle prescription;  CTL contact lenses; OD right eye; OS left eye; OU both eyes  XT exotropia; ET esotropia; PEK punctate epithelial keratitis; PEE punctate epithelial erosions; DES dry eye syndrome; MGD meibomian gland dysfunction; ATs artificial tears; PFAT's preservative free artificial tears; [place] nuclear sclerotic cataract; PSC posterior subcapsular cataract; ERM epi-retinal membrane; PVD posterior vitreous detachment; RD retinal detachment; DM diabetes mellitus; DR diabetic retinopathy; NPDR non-proliferative diabetic retinopathy; PDR proliferative diabetic retinopathy; CSME clinically significant macular edema; DME diabetic macular edema; dbh dot blot hemorrhages; CWS cotton wool spot; POAG primary open angle glaucoma; C/D cup-to-disc ratio; HVF humphrey visual field; GVF goldmann visual field; OCT optical coherence tomography; IOP intraocular pressure; BRVO Branch retinal vein occlusion; CRVO central retinal vein occlusion; CRAO central retinal artery occlusion;  BRAO branch retinal artery occlusion; RT retinal tear; SB scleral buckle; PPV pars plana vitrectomy; VH Vitreous hemorrhage; PRP panretinal laser photocoagulation; IVK intravitreal kenalog; VMT vitreomacular traction; MH Macular hole;  NVD neovascularization of the disc; NVE neovascularization elsewhere; AREDS age related eye disease study; ARMD age related macular degeneration; POAG primary open angle glaucoma; EBMD epithelial/anterior basement membrane dystrophy; ACIOL anterior chamber intraocular lens; IOL  intraocular lens; PCIOL posterior chamber intraocular lens; Phaco/IOL phacoemulsification with intraocular lens placement; [place] photorefractive keratectomy; LASIK laser assisted in situ keratomileusis; HTN hypertension; DM diabetes mellitus; COPD chronic obstructive pulmonary disease

## 2017-11-05 ENCOUNTER — Encounter (INDEPENDENT_AMBULATORY_CARE_PROVIDER_SITE_OTHER): Payer: Self-pay | Admitting: Ophthalmology

## 2017-11-05 ENCOUNTER — Ambulatory Visit (INDEPENDENT_AMBULATORY_CARE_PROVIDER_SITE_OTHER): Payer: Medicaid Other | Admitting: Ophthalmology

## 2017-11-05 DIAGNOSIS — E113513 Type 2 diabetes mellitus with proliferative diabetic retinopathy with macular edema, bilateral: Secondary | ICD-10-CM | POA: Diagnosis not present

## 2017-11-05 DIAGNOSIS — H3581 Retinal edema: Secondary | ICD-10-CM | POA: Diagnosis not present

## 2017-11-05 DIAGNOSIS — I10 Essential (primary) hypertension: Secondary | ICD-10-CM | POA: Diagnosis not present

## 2017-11-05 DIAGNOSIS — Z961 Presence of intraocular lens: Secondary | ICD-10-CM | POA: Diagnosis not present

## 2017-11-05 DIAGNOSIS — H35033 Hypertensive retinopathy, bilateral: Secondary | ICD-10-CM | POA: Diagnosis not present

## 2017-11-05 MED ORDER — TRIAMCINOLONE ACETONIDE 40 MG/ML IO SUSP
4.0000 mg | INTRAOCULAR | Status: AC
  Administered 2017-11-05: 4 mg via INTRAVITREAL

## 2017-11-09 ENCOUNTER — Encounter (INDEPENDENT_AMBULATORY_CARE_PROVIDER_SITE_OTHER): Payer: Self-pay | Admitting: Ophthalmology

## 2017-11-10 ENCOUNTER — Encounter (INDEPENDENT_AMBULATORY_CARE_PROVIDER_SITE_OTHER): Payer: Medicaid Other | Admitting: Ophthalmology

## 2017-11-19 ENCOUNTER — Encounter (INDEPENDENT_AMBULATORY_CARE_PROVIDER_SITE_OTHER): Payer: Medicaid Other | Admitting: Ophthalmology

## 2017-11-23 ENCOUNTER — Encounter (INDEPENDENT_AMBULATORY_CARE_PROVIDER_SITE_OTHER): Payer: Medicaid Other | Admitting: Ophthalmology

## 2017-11-23 DIAGNOSIS — I10 Essential (primary) hypertension: Secondary | ICD-10-CM

## 2017-11-23 DIAGNOSIS — H43813 Vitreous degeneration, bilateral: Secondary | ICD-10-CM

## 2017-11-23 DIAGNOSIS — E103513 Type 1 diabetes mellitus with proliferative diabetic retinopathy with macular edema, bilateral: Secondary | ICD-10-CM | POA: Diagnosis not present

## 2017-11-23 DIAGNOSIS — H35033 Hypertensive retinopathy, bilateral: Secondary | ICD-10-CM | POA: Diagnosis not present

## 2017-11-23 DIAGNOSIS — E10311 Type 1 diabetes mellitus with unspecified diabetic retinopathy with macular edema: Secondary | ICD-10-CM

## 2017-11-24 ENCOUNTER — Other Ambulatory Visit: Payer: Self-pay | Admitting: "Endocrinology

## 2017-12-09 ENCOUNTER — Encounter (INDEPENDENT_AMBULATORY_CARE_PROVIDER_SITE_OTHER): Payer: Medicaid Other | Admitting: Ophthalmology

## 2017-12-09 DIAGNOSIS — E10311 Type 1 diabetes mellitus with unspecified diabetic retinopathy with macular edema: Secondary | ICD-10-CM

## 2017-12-09 DIAGNOSIS — H43813 Vitreous degeneration, bilateral: Secondary | ICD-10-CM

## 2017-12-09 DIAGNOSIS — E103513 Type 1 diabetes mellitus with proliferative diabetic retinopathy with macular edema, bilateral: Secondary | ICD-10-CM | POA: Diagnosis not present

## 2017-12-09 DIAGNOSIS — I10 Essential (primary) hypertension: Secondary | ICD-10-CM

## 2017-12-09 DIAGNOSIS — H35033 Hypertensive retinopathy, bilateral: Secondary | ICD-10-CM

## 2017-12-17 ENCOUNTER — Encounter (INDEPENDENT_AMBULATORY_CARE_PROVIDER_SITE_OTHER): Payer: Medicaid Other | Admitting: Ophthalmology

## 2017-12-28 ENCOUNTER — Other Ambulatory Visit: Payer: Self-pay | Admitting: "Endocrinology

## 2017-12-29 LAB — COMPREHENSIVE METABOLIC PANEL
ALBUMIN: 4.1 g/dL (ref 3.5–5.5)
ALT: 14 IU/L (ref 0–32)
AST: 9 IU/L (ref 0–40)
Albumin/Globulin Ratio: 1.8 (ref 1.2–2.2)
Alkaline Phosphatase: 72 IU/L (ref 39–117)
BUN / CREAT RATIO: 20 (ref 9–23)
BUN: 13 mg/dL (ref 6–24)
Bilirubin Total: 0.4 mg/dL (ref 0.0–1.2)
CO2: 24 mmol/L (ref 20–29)
CREATININE: 0.66 mg/dL (ref 0.57–1.00)
Calcium: 9.4 mg/dL (ref 8.7–10.2)
Chloride: 102 mmol/L (ref 96–106)
GFR calc non Af Amer: 107 mL/min/{1.73_m2} (ref >59)
GFR, EST AFRICAN AMERICAN: 123 mL/min/{1.73_m2} (ref >59)
GLUCOSE: 156 mg/dL — AB (ref 65–99)
Globulin, Total: 2.3 g/dL (ref 1.5–4.5)
Potassium: 4.9 mmol/L (ref 3.5–5.2)
Sodium: 140 mmol/L (ref 134–144)
TOTAL PROTEIN: 6.4 g/dL (ref 6.0–8.5)

## 2017-12-29 LAB — HGB A1C W/O EAG: Hgb A1c MFr Bld: 8.1 % — ABNORMAL HIGH (ref 4.8–5.6)

## 2017-12-29 LAB — SPECIMEN STATUS REPORT

## 2017-12-31 ENCOUNTER — Ambulatory Visit (INDEPENDENT_AMBULATORY_CARE_PROVIDER_SITE_OTHER): Payer: Medicaid Other | Admitting: "Endocrinology

## 2017-12-31 ENCOUNTER — Encounter: Payer: Self-pay | Admitting: "Endocrinology

## 2017-12-31 ENCOUNTER — Other Ambulatory Visit: Payer: Self-pay

## 2017-12-31 VITALS — BP 137/86 | HR 90 | Ht 63.5 in | Wt 159.0 lb

## 2017-12-31 DIAGNOSIS — E11311 Type 2 diabetes mellitus with unspecified diabetic retinopathy with macular edema: Secondary | ICD-10-CM

## 2017-12-31 DIAGNOSIS — Z794 Long term (current) use of insulin: Secondary | ICD-10-CM

## 2017-12-31 DIAGNOSIS — E782 Mixed hyperlipidemia: Secondary | ICD-10-CM

## 2017-12-31 DIAGNOSIS — I10 Essential (primary) hypertension: Secondary | ICD-10-CM | POA: Diagnosis not present

## 2017-12-31 MED ORDER — INSULIN PEN NEEDLE 31G X 8 MM MISC: 1.0000 | 3 refills | Status: AC

## 2017-12-31 MED ORDER — FLUCONAZOLE 150 MG PO TABS: 150.0000 mg | ORAL_TABLET | Freq: Once | ORAL | 1 refills | Status: AC

## 2017-12-31 MED ORDER — LIRAGLUTIDE 18 MG/3ML ~~LOC~~ SOPN: 0.6000 mg | PEN_INJECTOR | Freq: Every day | SUBCUTANEOUS | 2 refills | Status: DC

## 2017-12-31 MED ORDER — INSULIN GLARGINE 100 UNIT/ML SOLOSTAR PEN: 50.0000 [IU] | PEN_INJECTOR | Freq: Every day | SUBCUTANEOUS | 2 refills | Status: DC

## 2017-12-31 NOTE — Patient Instructions (Signed)

## 2017-12-31 NOTE — Progress Notes (Signed)
Endocrinology follow-up note  Subjective:    Patient ID: Allison [name], female    DOB: 1970/07/25, PCP [name], MD   Past Medical History:  Diagnosis Date  . Brain cyst    x2  . Cancer (HCC)    cervical   . Dyslipidemia   . Gastroparesis    possible  . Glaucoma   . Hypertension   . Insulin dependent diabetes mellitus ([place])   . Neuropathy   . Retinopathy   . 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 ABDOMINAL HYSTERECTOMY     partial   . EYE SURGERY     multiple  . RADIOLOGY WITH ANESTHESIA N/A 07/07/2017   Procedure: MRI WITH ANESTHESIA,BRAIN WITHOUT CONTRAST;  Surgeon: Radiologist, Medication, MD;  Location: [place];  Service: Radiology;  Laterality: N/A;   Social History   Socioeconomic History  . Marital status: Divorced    Spouse name: Not on file  . Number of children: Not on file  . Years of education: Not on file  . Highest education level: Not on file  Occupational History  . Not on file  Social Needs  . Financial resource strain: Not on file  . Food insecurity:    Worry: Not on file    Inability: Not on file  . Transportation needs:    Medical: Not on file    Non-medical: Not on file  Tobacco Use  . Smoking status: Current Some Day Smoker    Packs/day: 0.25    Years: 26.00    Pack years: 6.50    Types: Cigarettes    Last attempt to quit: 05/05/2014    Years since quitting: 3.6  . Smokeless tobacco: Never Used  Substance and Sexual Activity  . Alcohol use: No  . Drug use: No  . Sexual activity: Yes    Birth control/protection: Surgical  Lifestyle  . Physical activity:    Days per week: Not on file    Minutes per session: Not on file  . Stress: Not on file  Relationships  . Social connections:    Talks on phone: Not on file    Gets together: Not on file    Attends religious service: Not on file    Active member of club or organization: Not on file    Attends meetings of  clubs or organizations: Not on file    Relationship status: Not on file  Other Topics Concern  . Not on file  Social History Narrative  . Not on file   Outpatient Encounter Medications as of 12/31/2017  Medication Sig  . albuterol (PROAIR HFA) 108 (90 BASE) MCG/ACT inhaler Inhale 2 puffs into the lungs every 4 (four) hours as needed for wheezing or shortness of breath.  . ALPHAGAN P 0.1 % SOLN Place 1 drop into both eyes 2 (two) times daily.  [name] ALPRAZolam (XANAX) 0.5 MG tablet Take 0.5 mg by mouth 4 (four) times daily as needed for anxiety.   [name] amphetamine-dextroamphetamine (ADDERALL) 10 MG tablet Take 10 mg by mouth 2 (two) times daily with a meal.  . aspirin EC 81 MG tablet Take 81 mg by mouth daily.  . blood glucose meter kit and supplies KIT Dispense based on patient and insurance preference. Use up to four times daily as directed. (FOR ICD 10 E11.65).  [name] conjugated estrogens (PREMARIN) vaginal cream Place 4.74 Applicatorfuls vaginally 3 (three) times a week.  . furosemide (LASIX)  20 MG tablet Take 20 mg by mouth 2 (two) times daily as needed.   [name] glucose blood (ACCU-CHEK AVIVA PLUS) test strip USE AS DIRECTED UP TO FOUR TIMES DAILY.  [name] HYDROcodone-acetaminophen (NORCO) 7.5-325 MG tablet Take 1 tablet by mouth every 4 (four) hours as needed for severe pain.  . Insulin Glargine (LANTUS SOLOSTAR) 100 UNIT/ML Solostar Pen Inject 50 Units into the skin daily at 10 pm.  . Insulin Pen Needle (B-D ULTRAFINE III SHORT PEN) 31G X 8 MM MISC 1 each by Does not apply route as directed.  . liraglutide (VICTOZA) 18 MG/3ML SOPN Inject 0.1 mLs (0.6 mg total) into the skin daily.  [name] loratadine (CLARITIN) 10 MG tablet Take 10 mg by mouth daily.  . Melatonin 5 MG TABS Take 1 tablet by mouth at bedtime.  . moxifloxacin (VIGAMOX) 0.5 % ophthalmic solution Place 1 drop into both eyes See admin instructions. Uses when having eye injections (every 6 weeks)  . Multiple Vitamin (MULTIVITAMIN) tablet Take 1 tablet  by mouth daily.  [name] omeprazole (PRILOSEC) 20 MG capsule Take 20 mg by mouth 2 (two) times daily before a meal.   . polyethylene glycol (MIRALAX / GLYCOLAX) packet Take 17 g by mouth daily.  . potassium chloride SA (K-DUR,KLOR-CON) 20 MEQ tablet Take 20 mEq by mouth 2 (two) times daily.   . [DISCONTINUED] Insulin Glargine (LANTUS SOLOSTAR) 100 UNIT/ML Solostar Pen Inject 40 Units into the skin daily at 10 pm.  . [DISCONTINUED] Insulin Pen Needle (B-D ULTRAFINE III SHORT PEN) 31G X 8 MM MISC 1 each by Does not apply route as directed.  . [DISCONTINUED] JANUVIA 50 MG tablet TAKE ONE TABLET BY MOUTH ONCE DAILY.   Facility-Administered Encounter Medications as of 12/31/2017  Medication  . triamcinolone acetonide (TRIESENCE) 40 MG/ML subtenons injection 4 mg  . triamcinolone acetonide (TRIESENCE) 40 MG/ML subtenons injection 4 mg   ALLERGIES: Allergies  Allergen Reactions  . Cymbalta [Duloxetine Hcl] Shortness Of Breath  . Beta Adrenergic Blockers Palpitations  . Codeine Itching, Dermatitis and Other (See Comments)    TYLENOL #3   Welts and itching  . Lexapro [Escitalopram Oxalate] Hives and Palpitations  . Other Palpitations    Allergic to all beta blocker eye drops!!  . Doxycycline Other (See Comments)    UNSPECIFIED Non-specific Heart problems  . Oxycodone Itching   VACCINATION STATUS:  There is no immunization history on file for this patient.  Diabetes  She presents for her follow-up diabetic visit. She has type 2 diabetes mellitus. Onset time: She was diagnosed at approximate age of 62 years. Her disease course has been stable. There are no hypoglycemic associated symptoms. Pertinent negatives for hypoglycemia include no confusion, headaches, pallor or seizures. Associated symptoms include blurred vision. Pertinent negatives for diabetes include no chest pain, no polydipsia, no polyphagia and no polyuria. There are no hypoglycemic complications. Symptoms are stable. Diabetic  complications include retinopathy. Risk factors for coronary artery disease include diabetes mellitus, dyslipidemia, hypertension, tobacco exposure and sedentary lifestyle. Current diabetic treatment includes insulin injections and oral agent (monotherapy). She is compliant with treatment most of the time. Her weight is fluctuating minimally. She is following a generally unhealthy diet. When asked about meal planning, she reported none. She never participates in exercise. There is no change in her home blood glucose trend. Her overall blood glucose range is >200 mg/dl. (She returns with A1c of 8.1% unchanged from last visit.  Her average blood glucose is between 196 and 210 over the  last 14 days.) Eye exam is current.  Hyperlipidemia  This is a chronic problem. The current episode started more than 1 year ago. The problem is controlled. Exacerbating diseases include diabetes. Pertinent negatives include no chest pain, myalgias or shortness of breath. Risk factors for coronary artery disease include diabetes mellitus, dyslipidemia, hypertension and a sedentary lifestyle.  Hypertension  This is a chronic problem. The current episode started more than 1 year ago. The problem is controlled. Associated symptoms include blurred vision. Pertinent negatives include no chest pain, headaches, palpitations or shortness of breath. Risk factors for coronary artery disease include dyslipidemia, diabetes mellitus, obesity, sedentary lifestyle and smoking/tobacco exposure. Hypertensive end-organ damage includes retinopathy.     Review of Systems  Constitutional: Negative for chills, fever and unexpected weight change.  HENT: Negative for trouble swallowing and voice change.   Eyes: Positive for blurred vision. Negative for visual disturbance.  Respiratory: Negative for cough, shortness of breath and wheezing.   Cardiovascular: Negative for chest pain, palpitations and leg swelling.  Gastrointestinal: Negative for  diarrhea, nausea and vomiting.  Endocrine: Negative for cold intolerance, heat intolerance, polydipsia, polyphagia and polyuria.  Musculoskeletal: Negative for arthralgias and myalgias.  Skin: Negative for color change, pallor, rash and wound.  Neurological: Negative for seizures and headaches.  Psychiatric/Behavioral: Negative for confusion and suicidal ideas.    Objective:    BP 137/86   Pulse 90   Ht 5' 3.5" (1.613 m)   Wt 159 lb (72.1 kg)   BMI 27.72 kg/m   Wt Readings from Last 3 Encounters:  12/31/17 159 lb (72.1 kg)  09/29/17 154 lb (69.9 kg)  07/07/17 157 lb (71.2 kg)    Physical Exam  Constitutional: She is oriented to person, place, and time. She appears well-developed.  HENT:  Head: Normocephalic and atraumatic.  Eyes: EOM are normal.  Neck: Normal range of motion. Neck supple. No tracheal deviation present. No thyromegaly present.  Cardiovascular: Normal rate.  Pulmonary/Chest: Effort normal.  Abdominal: There is no tenderness. There is no guarding.  Musculoskeletal: Normal range of motion. She exhibits no edema.  Neurological: She is alert and oriented to person, place, and time. No cranial nerve deficit. Coordination normal.  Skin: Skin is warm and dry. No rash noted. No erythema. No pallor.  Psychiatric: She has a normal mood and affect. Judgment normal.   Results for orders placed or performed in visit on 12/28/17  Comprehensive metabolic panel  Result Value Ref Range   Glucose 156 (H) 65 - 99 mg/dL   BUN 13 6 - 24 mg/dL   Creatinine, Ser 0.66 0.57 - 1.00 mg/dL   GFR calc non Af Amer 107 >59 mL/min/1.73   GFR calc Af Amer 123 >59 mL/min/1.73   BUN/Creatinine Ratio 20 9 - 23   Sodium 140 134 - 144 mmol/L   Potassium 4.9 3.5 - 5.2 mmol/L   Chloride 102 96 - 106 mmol/L   CO2 24 20 - 29 mmol/L   Calcium 9.4 8.7 - 10.2 mg/dL   Total Protein 6.4 6.0 - 8.5 g/dL   Albumin 4.1 3.5 - 5.5 g/dL   Globulin, Total 2.3 1.5 - 4.5 g/dL   Albumin/Globulin Ratio 1.8  1.2 - 2.2   Bilirubin Total 0.4 0.0 - 1.2 mg/dL   Alkaline Phosphatase 72 39 - 117 IU/L   AST 9 0 - 40 IU/L   ALT 14 0 - 32 IU/L  Hgb A1c w/o eAG  Result Value Ref Range   Hgb A1c MFr  Bld 8.1 (H) 4.8 - 5.6 %  Specimen status report  Result Value Ref Range   specimen status report Comment    Diabetic Labs (most recent): Lab Results  Component Value Date   HGBA1C 8.1 (H) 12/28/2017   HGBA1C 8.1 (H) 09/23/2017   HGBA1C 7.4 (H) 06/23/2017   Lipid Panel     Component Value Date/Time   CHOL 147 04/20/2017 0943   TRIG 182 (H) 04/20/2017 0943   HDL 41 04/20/2017 0943   LDLCALC 70 04/20/2017 0943     Assessment & Plan:   1. 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 These are all discussed in detail with the patient.  -She returns with A1c of 8.1% unchanged from her last visit, she has history of high A1c of 14.7% in the past.      Recent labs reviewed, showing normal renal function.  - I have re-counseled the patient on diet management  by adopting a carbohydrate restricted / protein rich  Diet.  -  Suggestion is made for her to avoid simple carbohydrates  from her diet including Cakes, Sweet Desserts / Pastries, Ice Cream, Soda (diet and regular), Sweet Tea, Candies, Chips, Cookies, Store Bought Juices, Alcohol in Excess of  1-2 drinks a day, Artificial Sweeteners, and "Sugar-free" Products. This will help patient to have stable blood glucose profile and potentially avoid unintended weight gain.    - Patient is advised to stick to a routine mealtimes to eat 3 meals  a day and avoid unnecessary snacks ( to snack only to correct hypoglycemia).  - I have approached  patient with the following individualized plan to manage diabetes and patient agrees.  -She states Januvia did not curb her appetite and rather making her eat more and would like to have Invokana prescription.  However, in light of recent concerning publications about SGLT2 inhibitors, I suggested to her that she would be better off without Invokana.  -I advised her to increase Lantus to 50 units nightly, start strict monitoring of blood glucose 4 times a day-before meals and at bedtime and return in 10 days for reevaluation.   -She is open to use Victoza, started to 0.6 mg subcutaneously daily to advance as tolerated.  -If she continues to lose control, she will be considered for prandial insulin on subsequent visits.     - She does not tolerate metformin.  - Patient specific target  for A1c; LDL, HDL, Triglycerides, and  Waist Circumference were discussed in detail.  2) BP/HTN: Her blood pressure is controlled to target.   3) Lipids/HPL: Her recent lipid panel showed controlled LDL at 70.  She is not on statins.  4)  Weight/Diet:  exercise, and carbohydrates information provided.  5) Chronic Care/Health Maintenance:  -Patient is  encouraged to continue to follow up with Ophthalmology, Podiatrist at least yearly or according to recommendations, and advised to quit smoking. I have recommended yearly flu vaccine and pneumonia vaccination at least every 5 years; moderate intensity exercise for up to 150 minutes weekly; and  sleep for at least 7 hours a day.  - I advised patient to maintain close follow up with [name], MD for primary care needs. - Time spent with the  patient: 25 min, of which >50% was spent in reviewing her blood glucose logs , discussing her hypo- and hyper-glycemic episodes, reviewing her current and  previous labs and insulin doses and developing a plan to avoid hypo- and hyper-glycemia. Please refer to Patient Instructions for Blood Glucose Monitoring and  Insulin/Medications Dosing Guide"  in media tab for additional information. Stephens Shire participated in the discussions, expressed understanding, and voiced agreement with the above plans.  All questions were answered to her satisfaction. she is encouraged to contact clinic should she have any questions or concerns prior to her return visit.   Follow up plan: -Return in about 10 days (around 01/10/2018) for Follow up with Meter and Logs Only - no Labs.   Glade Lloyd, MD Phone: [phone]  Fax: [phone]  -  This note was partially dictated with voice recognition software. Similar sounding words can be transcribed inadequately or may not  be corrected upon review.  12/31/2017, 9:36 AM

## 2018-01-01 ENCOUNTER — Other Ambulatory Visit: Payer: Self-pay | Admitting: "Endocrinology

## 2018-01-04 ENCOUNTER — Encounter (INDEPENDENT_AMBULATORY_CARE_PROVIDER_SITE_OTHER): Payer: Medicaid Other | Admitting: Ophthalmology

## 2018-01-04 DIAGNOSIS — H35033 Hypertensive retinopathy, bilateral: Secondary | ICD-10-CM

## 2018-01-04 DIAGNOSIS — E10311 Type 1 diabetes mellitus with unspecified diabetic retinopathy with macular edema: Secondary | ICD-10-CM | POA: Diagnosis not present

## 2018-01-04 DIAGNOSIS — H43813 Vitreous degeneration, bilateral: Secondary | ICD-10-CM

## 2018-01-04 DIAGNOSIS — E103513 Type 1 diabetes mellitus with proliferative diabetic retinopathy with macular edema, bilateral: Secondary | ICD-10-CM

## 2018-01-04 DIAGNOSIS — I10 Essential (primary) hypertension: Secondary | ICD-10-CM

## 2018-01-07 ENCOUNTER — Encounter (INDEPENDENT_AMBULATORY_CARE_PROVIDER_SITE_OTHER): Payer: Medicaid Other | Admitting: Ophthalmology

## 2018-01-10 ENCOUNTER — Emergency Department (HOSPITAL_COMMUNITY): Payer: Medicaid Other

## 2018-01-10 ENCOUNTER — Other Ambulatory Visit: Payer: Self-pay

## 2018-01-10 ENCOUNTER — Encounter (HOSPITAL_COMMUNITY): Payer: Self-pay | Admitting: Emergency Medicine

## 2018-01-10 ENCOUNTER — Emergency Department (HOSPITAL_COMMUNITY)
Admission: EM | Admit: 2018-01-10 | Discharge: 2018-01-10 | Disposition: A | Discharge status: Home / Self Care | Payer: Medicaid Other | Attending: Emergency Medicine | Admitting: Emergency Medicine

## 2018-01-10 DIAGNOSIS — Z794 Long term (current) use of insulin: Secondary | ICD-10-CM | POA: Insufficient documentation

## 2018-01-10 DIAGNOSIS — Y829 Unspecified medical devices associated with adverse incidents: Secondary | ICD-10-CM | POA: Insufficient documentation

## 2018-01-10 DIAGNOSIS — T383X5A Adverse effect of insulin and oral hypoglycemic [antidiabetic] drugs, initial encounter: Secondary | ICD-10-CM | POA: Diagnosis not present

## 2018-01-10 DIAGNOSIS — E785 Hyperlipidemia, unspecified: Secondary | ICD-10-CM | POA: Diagnosis not present

## 2018-01-10 DIAGNOSIS — F17210 Nicotine dependence, cigarettes, uncomplicated: Secondary | ICD-10-CM | POA: Insufficient documentation

## 2018-01-10 DIAGNOSIS — T50905A Adverse effect of unspecified drugs, medicaments and biological substances, initial encounter: Secondary | ICD-10-CM

## 2018-01-10 DIAGNOSIS — R1011 Right upper quadrant pain: Secondary | ICD-10-CM | POA: Diagnosis not present

## 2018-01-10 DIAGNOSIS — T887XXA Unspecified adverse effect of drug or medicament, initial encounter: Secondary | ICD-10-CM | POA: Diagnosis not present

## 2018-01-10 DIAGNOSIS — I10 Essential (primary) hypertension: Secondary | ICD-10-CM | POA: Diagnosis not present

## 2018-01-10 DIAGNOSIS — Z79899 Other long term (current) drug therapy: Secondary | ICD-10-CM | POA: Diagnosis not present

## 2018-01-10 DIAGNOSIS — E11311 Type 2 diabetes mellitus with unspecified diabetic retinopathy with macular edema: Secondary | ICD-10-CM | POA: Insufficient documentation

## 2018-01-10 DIAGNOSIS — R112 Nausea with vomiting, unspecified: Secondary | ICD-10-CM | POA: Diagnosis not present

## 2018-01-10 DIAGNOSIS — Z7982 Long term (current) use of aspirin: Secondary | ICD-10-CM | POA: Diagnosis not present

## 2018-01-10 LAB — CBC WITH DIFFERENTIAL/PLATELET
Basophils Absolute: 0 K/uL (ref 0.0–0.1)
Basophils Relative: 0 %
Eosinophils Absolute: 0.2 K/uL (ref 0.0–0.7)
Eosinophils Relative: 1 %
HCT: 42.7 % (ref 36.0–46.0)
Hemoglobin: 14.9 g/dL (ref 12.0–15.0)
Lymphocytes Relative: 16 %
Lymphs Abs: 1.9 K/uL (ref 0.7–4.0)
MCH: 30.8 pg (ref 26.0–34.0)
MCHC: 34.9 g/dL (ref 30.0–36.0)
MCV: 88.4 fL (ref 78.0–100.0)
Monocytes Absolute: 0.6 K/uL (ref 0.1–1.0)
Monocytes Relative: 5 %
Neutro Abs: 9.6 K/uL — ABNORMAL HIGH (ref 1.7–7.7)
Neutrophils Relative %: 78 %
Platelets: 312 K/uL (ref 150–400)
RBC: 4.83 MIL/uL (ref 3.87–5.11)
RDW: 12.2 % (ref 11.5–15.5)
WBC: 12.3 K/uL — ABNORMAL HIGH (ref 4.0–10.5)

## 2018-01-10 LAB — COMPREHENSIVE METABOLIC PANEL WITH GFR
ALT: 16 U/L (ref 0–44)
AST: 16 U/L (ref 15–41)
Albumin: 4.6 g/dL (ref 3.5–5.0)
Alkaline Phosphatase: 71 U/L (ref 38–126)
Anion gap: 8 (ref 5–15)
BUN: 14 mg/dL (ref 6–20)
CO2: 29 mmol/L (ref 22–32)
Calcium: 9.5 mg/dL (ref 8.9–10.3)
Chloride: 103 mmol/L (ref 98–111)
Creatinine, Ser: 0.62 mg/dL (ref 0.44–1.00)
GFR calc Af Amer: >60 mL/min
GFR calc non Af Amer: >60 mL/min
Glucose, Bld: 160 mg/dL — ABNORMAL HIGH (ref 70–99)
Potassium: 3.6 mmol/L (ref 3.5–5.1)
Sodium: 140 mmol/L (ref 135–145)
Total Bilirubin: 0.9 mg/dL (ref 0.3–1.2)
Total Protein: 8.2 g/dL — ABNORMAL HIGH (ref 6.5–8.1)

## 2018-01-10 LAB — TROPONIN I
TROPONIN I: 0.04 ng/mL — AB (ref <0.03)
Troponin I: 0.04 ng/mL (ref <0.03)

## 2018-01-10 LAB — LIPASE, BLOOD: LIPASE: 27 U/L (ref 11–51)

## 2018-01-10 LAB — CBG MONITORING, ED: GLUCOSE-CAPILLARY: 156 mg/dL — AB (ref 70–99)

## 2018-01-10 MED ORDER — GI COCKTAIL ~~LOC~~
30.0000 mL | Freq: Once | ORAL | Status: AC
  Administered 2018-01-10: 30 mL via ORAL
  Filled 2018-01-10: qty 30

## 2018-01-10 MED ORDER — METOCLOPRAMIDE HCL 5 MG/ML IJ SOLN
10.0000 mg | Freq: Once | INTRAMUSCULAR | Status: AC
  Administered 2018-01-10: 10 mg via INTRAVENOUS
  Filled 2018-01-10: qty 2

## 2018-01-10 MED ORDER — ONDANSETRON HCL 4 MG/2ML IJ SOLN
4.0000 mg | Freq: Once | INTRAMUSCULAR | Status: AC
Start: 2018-01-10 — End: 2018-01-10
  Administered 2018-01-10: 4 mg via INTRAVENOUS
  Filled 2018-01-10: qty 2

## 2018-01-10 MED ORDER — ONDANSETRON 4 MG PO TBDP: 4.0000 mg | ORAL_TABLET | Freq: Three times a day (TID) | ORAL | 0 refills | Status: DC | PRN

## 2018-01-10 MED ORDER — METOCLOPRAMIDE HCL 10 MG PO TABS: 10.0000 mg | ORAL_TABLET | Freq: Four times a day (QID) | ORAL | 0 refills | Status: DC | PRN

## 2018-01-10 MED ORDER — IOPAMIDOL (ISOVUE-300) INJECTION 61%
100.0000 mL | Freq: Once | INTRAVENOUS | Status: AC | PRN
  Administered 2018-01-10: 100 mL via INTRAVENOUS

## 2018-01-10 MED ORDER — SODIUM CHLORIDE 0.9 % IV BOLUS
1000.0000 mL | Freq: Once | INTRAVENOUS | Status: AC
  Administered 2018-01-10: 1000 mL via INTRAVENOUS

## 2018-01-10 NOTE — ED Notes (Signed)
Patient transported to CT 

## 2018-01-10 NOTE — ED Notes (Signed)
CRITICAL VALUE ALERT  Critical Value: Troponin 0.04 Date & Time Notied:01/10/18 Provider Notified:[place] Orders Received/Actions taken:None yet

## 2018-01-10 NOTE — ED Provider Notes (Signed)
[facility] Provider Note   CSN: [id number] Arrival date & time: 01/10/18  0355     History   Chief Complaint Chief Complaint  Patient presents with  . Allergic Reaction    HPI Allison [name] is a 47 y.o. female.  Patient here with what she thinks is allergic reaction to her new diabetic medication Victoza.  She took this for the first time at 3 PM yesterday.  About 10 minutes later she developed nausea and watery mouth.  But she felt well enough to the eat a taco.  Shortly after she developed nausea and vomiting that has been persistent since.  Vomiting is nonbloody and nonbilious.  No diarrhea.  No fever.  Has some upper abdominal tenderness.  No difficulty breathing or difficulty swallowing.  No chest pain or shortness of breath.  Planes of sour taste in her mouth.  Her significant other ate the same tacos and did not get sick.  She felt well prior to 3 PM after taking the Victoza.  Patient with history of gastroparesis as well per documentation as well as esophageal reflux disease.  She denies any alcohol or NSAID use.  The history is provided by the patient.  Allergic Reaction    Past Medical History:  Diagnosis Date  . Brain cyst    x2  . Cancer (HCC)    cervical   . Dyslipidemia   . Gastroparesis    possible  . Glaucoma   . Hypertension   . Insulin dependent diabetes mellitus ([place])   . Neuropathy   . Retinopathy   . 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 Vasomotor flushing 10/04/2014  . 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 Diabetic neuropathy ([place]) 10/08/2012  . Mixed hyperlipidemia 10/08/2012  . Essential hypertension 10/08/2012  . Atypical chest pain 10/08/2012  . Palpitations 10/08/2012    Past Surgical History:  Procedure Laterality Date  . ABDOMINAL HYSTERECTOMY     partial   . EYE SURGERY     multiple  . RADIOLOGY WITH ANESTHESIA N/A 07/07/2017   Procedure: MRI WITH ANESTHESIA,BRAIN WITHOUT CONTRAST;  Surgeon: Radiologist, Medication, MD;  Location: [facility];  Service: Radiology;  Laterality: N/A;     OB History    Gravida  1   Para  1   Term      Preterm      AB      Living        SAB      TAB      Ectopic      Multiple      Live Births               Home Medications    Prior to Admission medications   Medication Sig Start Date End Date Taking? Authorizing Provider  ACCU-CHEK AVIVA PLUS test strip USE AS DIRECTED UP TO FOUR TIMES DAILY. 01/04/18  Yes Nida, Marella Chimes, MD  albuterol (PROAIR HFA) 108 (90 BASE) MCG/ACT inhaler Inhale 2 puffs into the lungs every 4 (four) hours as needed for wheezing or shortness of breath.   Yes [provider]  ALPHAGAN P 0.1 % SOLN Place 1 drop into both eyes 2 (two) times daily. 05/02/17  Yes [provider]  ALPRAZolam Duanne Moron) 0.5 MG tablet Take 0.5 mg by mouth  4 (four) times daily as needed for anxiety.    Yes [provider]  amphetamine-dextroamphetamine (ADDERALL) 10 MG tablet Take 10 mg by mouth 2 (two) times daily with a meal.   Yes [provider]  aspirin EC 81 MG tablet Take 81 mg by mouth daily.   Yes [provider]  blood glucose meter kit and supplies KIT Dispense based on patient and insurance preference. Use up to four times daily as directed. (FOR ICD 10 E11.65). 07/16/15  Yes Nida, Marella Chimes, MD  conjugated estrogens (PREMARIN) vaginal cream Place 5.85 Applicatorfuls vaginally 3 (three) times a week. 10/10/16  Yes Jonnie Kind, MD  furosemide (LASIX) 20 MG tablet Take 20 mg by mouth 2 (two) times daily as needed.    Yes [provider]  HYDROcodone-acetaminophen (NORCO) 7.5-325 MG tablet Take 1 tablet by mouth every 4 (four) hours as needed for severe pain.   Yes [provider]  Insulin Glargine (LANTUS  SOLOSTAR) 100 UNIT/ML Solostar Pen Inject 50 Units into the skin daily at 10 pm. 12/31/17  Yes Nida, Marella Chimes, MD  Insulin Pen Needle (B-D ULTRAFINE III SHORT PEN) 31G X 8 MM MISC 1 each by Does not apply route as directed. 12/31/17  Yes Nida, Marella Chimes, MD  liraglutide (VICTOZA) 18 MG/3ML SOPN Inject 0.1 mLs (0.6 mg total) into the skin daily. 12/31/17  Yes Nida, Marella Chimes, MD  loratadine (CLARITIN) 10 MG tablet Take 10 mg by mouth daily.   Yes [provider]  Melatonin 5 MG TABS Take 1 tablet by mouth at bedtime.   Yes [provider]  moxifloxacin (VIGAMOX) 0.5 % ophthalmic solution Place 1 drop into both eyes See admin instructions. Uses when having eye injections (every 6 weeks)   Yes [provider]  Multiple Vitamin (MULTIVITAMIN) tablet Take 1 tablet by mouth daily.   Yes [provider]  omeprazole (PRILOSEC) 20 MG capsule Take 20 mg by mouth 2 (two) times daily before a meal.    Yes [provider]  polyethylene glycol (MIRALAX / GLYCOLAX) packet Take 17 g by mouth daily.   Yes [provider]  potassium chloride SA (K-DUR,KLOR-CON) 20 MEQ tablet Take 20 mEq by mouth 2 (two) times daily.    Yes [provider]    Family History Family History  Problem Relation Age of Onset  . Diabetes Mother   . Hypertension Mother   . Cancer Father        brain   . Diabetes Father   . Stroke Father   . Iron deficiency Sister   . Hypertension Brother   . Hyperlipidemia Brother   . Cancer Other        family h/o  . Coronary artery disease Other        family h/o    Social History Social History   Tobacco Use  . Smoking status: Current Some Day Smoker    Packs/day: 0.25    Years: 26.00    Pack years: 6.50    Types: Cigarettes    Last attempt to quit: 05/05/2014    Years since quitting: 3.6  . Smokeless tobacco: Never Used  Substance Use Topics  . Alcohol use: No  . Drug use: No     Allergies     Cymbalta [duloxetine hcl]; Beta adrenergic blockers; Codeine; Lexapro [escitalopram oxalate]; Other; Doxycycline; and Oxycodone   Review of Systems Review of Systems  Constitutional: Positive for fever. Negative for activity change and  appetite change.  HENT: Negative for congestion and rhinorrhea.   Eyes: Negative for visual disturbance.  Respiratory: Negative for cough, chest tightness and shortness of breath.   Cardiovascular: Negative for chest pain.  Gastrointestinal: Positive for abdominal pain, nausea and vomiting.  Genitourinary: Negative for dysuria and hematuria.  Musculoskeletal: Negative for arthralgias and myalgias.  Neurological: Negative for dizziness, weakness, light-headedness and headaches.    all other systems are negative except as noted in the HPI and PMH.    Physical Exam Updated Vital Signs BP (!) 151/92 (BP Location: Right Arm)   Pulse 88   Temp 98 F (36.7 C) (Oral)   Resp 15   Ht 5' 3"  (1.6 m)   Wt 70.8 kg   SpO2 95%   BMI 27.63 kg/m   Physical Exam  Constitutional: She is oriented to person, place, and time. She appears well-developed and well-nourished. No distress.  HENT:  Head: Normocephalic and atraumatic.  Mouth/Throat: Oropharynx is clear and moist. No oropharyngeal exudate.  No tongue or lip swelling  Eyes: Pupils are equal, round, and reactive to light. Conjunctivae and EOM are normal.  Neck: Normal range of motion. Neck supple.  No meningismus.  Cardiovascular: Normal rate, regular rhythm, normal heart sounds and intact distal pulses.  No murmur heard. Pulmonary/Chest: Effort normal and breath sounds normal. No respiratory distress.  Abdominal: Soft. There is tenderness. There is guarding. There is no rebound.  TTP RUQ and epigastric tenderness with guarding  Musculoskeletal: Normal range of motion. She exhibits no edema or tenderness.  Neurological: She is alert and oriented to person, place, and time. No cranial nerve deficit. She  exhibits normal muscle tone. Coordination normal.   5/5 strength throughout. CN 2-12 intact.Equal grip strength.   Skin: Skin is warm.  Psychiatric: She has a normal mood and affect. Her behavior is normal.  Nursing note and vitals reviewed.    ED Treatments / Results  Labs (all labs ordered are listed, but only abnormal results are displayed) Labs Reviewed  CBC WITH DIFFERENTIAL/PLATELET - Abnormal; Notable for the following components:      Result Value   WBC 12.3 (*)    Neutro Abs 9.6 (*)    All other components within normal limits  COMPREHENSIVE METABOLIC PANEL - Abnormal; Notable for the following components:   Glucose, Bld 160 (*)    Total Protein 8.2 (*)    All other components within normal limits  TROPONIN I - Abnormal; Notable for the following components:   Troponin I 0.04 (*)    All other components within normal limits  TROPONIN I - Abnormal; Notable for the following components:   Troponin I 0.04 (*)    All other components within normal limits  CBG MONITORING, ED - Abnormal; Notable for the following components:   Glucose-Capillary 156 (*)    All other components within normal limits  LIPASE, BLOOD    EKG EKG Interpretation  Date/Time:  Sunday January 10 2018 04:32:02 EDT Ventricular Rate:  80 PR Interval:    QRS Duration: 86 QT Interval:  371 QTC Calculation: 428 R Axis:   -7 Text Interpretation:  Sinus rhythm Probable left atrial enlargement No significant change was found Confirmed by Ezequiel Essex [id number]) on 01/10/2018 4:37:47 AM   Radiology Ct Abdomen Pelvis W Contrast  Result Date: 01/10/2018 CLINICAL DATA:  Right upper quadrant pain and vomiting. Possible allergic reaction to medication. EXAM: CT ABDOMEN AND PELVIS WITH CONTRAST TECHNIQUE: Multidetector CT imaging of the abdomen and pelvis was  performed using the standard protocol following bolus administration of intravenous contrast. CONTRAST:  121m ISOVUE-300 IOPAMIDOL (ISOVUE-300)  INJECTION 61% COMPARISON:  06/03/2006 mild dependent changes in the lung bases. FINDINGS: Lower chest: No acute abnormality. Hepatobiliary: No focal liver abnormality is seen. No gallstones, gallbladder wall thickening, or biliary dilatation. Pancreas: Unremarkable. No pancreatic ductal dilatation or surrounding inflammatory changes. Spleen: Normal in size without focal abnormality. Adrenals/Urinary Tract: Adrenal glands are unremarkable. Kidneys are normal, without renal calculi, focal lesion, or hydronephrosis. Bladder is unremarkable. Stomach/Bowel: Stomach is within normal limits. Appendix appears normal. No evidence of bowel wall thickening, distention, or inflammatory changes. Vascular/Lymphatic: Aortic atherosclerosis. No enlarged abdominal or pelvic lymph nodes. Reproductive: Status post hysterectomy. No adnexal masses. Other: No abdominal wall hernia or abnormality. No abdominopelvic ascites. Musculoskeletal: No acute or significant osseous findings. IMPRESSION: No acute findings within the abdomen or pelvis. Aortic Atherosclerosis (ICD10-I70.0). Electronically Signed   By: WLucienne CapersM.D.   On: 01/10/2018 06:38   Dg Abdomen Acute W/chest  Result Date: 01/10/2018 CLINICAL DATA:  Vomiting since last night. Possible allergic reaction to medication. EXAM: DG ABDOMEN ACUTE W/ 1V CHEST COMPARISON:  Chest 02/14/2015 FINDINGS: Normal heart size and pulmonary vascularity. No focal airspace disease or consolidation in the lungs. No blunting of costophrenic angles. No pneumothorax. Mediastinal contours appear intact. Scattered gas and stool in the colon. No small or large bowel distention. No free intra-abdominal air. No abnormal air-fluid levels. No radiopaque stones. Visualized bones appear intact. IMPRESSION: No evidence of active pulmonary disease. Normal nonobstructive bowel gas pattern. Electronically Signed   By: WLucienne CapersM.D.   On: 01/10/2018 05:36    Procedures Procedures (including  critical care time)  Medications Ordered in ED Medications  sodium chloride 0.9 % bolus 1,000 mL (0 mLs Intravenous Stopped 01/10/18 0604)  ondansetron (ZOFRAN) injection 4 mg (4 mg Intravenous Given 01/10/18 0503)  metoCLOPramide (REGLAN) injection 10 mg (10 mg Intravenous Given 01/10/18 0503)  gi cocktail (Maalox,Lidocaine,Donnatal) (30 mLs Oral Given 01/10/18 0503)  iopamidol (ISOVUE-300) 61 % injection 100 mL (100 mLs Intravenous Contrast Given 01/10/18 0624)     Initial Impression / Assessment and Plan / ED Course  I have reviewed the triage vital signs and the nursing notes.  Pertinent labs & imaging results that were available during my care of the patient were reviewed by me and considered in my medical decision making (see chart for details).    Nausea and vomiting shortly after taking Victoza for the first time.  Unclear whether this represents allergic reaction.  Does not meet criteria for anaphylaxis as she has only one body system involvement.  She will be treated symptomatically and labs will be checked. EKG nsr. TTP RUQ and epigastrium.  LFTs and lipase normal. WBC 12.  Feeling improved after IVF, reglan, zofran, GI Cocktail.   Minimal troponin elevation of 0.04 noted.  Patient denies any chest pain.  EKG nonischemic.  CT obtained given her RUQ tenderness and lack of [place] this time. Gallbladder appears normal. And RUQ tenderness has resolved on recheck.  Repeat troponin also 0.04.  Patient feels improved and is tolerating p.o.  She denies chest pain.  Low suspicion for ACS but do recommend 6-hour troponin to make sure it is not elevating.  Patient declines and wishes to go home when she feels improved.  Discussed with her that MI appears unlikely as her troponins are flat but would recommend a 6-hour troponin.  She is unwilling to stay for this and understands  the risks of leaving [facility]. She understands that a heart attack and ACS have not been ruled out and  can be life threatening. She appears to have capacity to make this decision.  Recommend d/w victoza and let Dr. Dorris Fetch know. Add reglan as gastroparesis may be contributing. Feels improved but will leave AMA as above. She knows she is free to return at any time. Final Clinical Impressions(s) / ED Diagnoses   Final diagnoses:  Nausea and vomiting, intractability of vomiting not specified, unspecified vomiting type  Adverse effect of drug, initial encounter    ED Discharge Orders    None       Shandale Malak, Annie Main, MD 01/10/18 [phone]

## 2018-01-10 NOTE — ED Triage Notes (Signed)
Pt thinks she is having an allergic reaction to new diabetic medicine (Victoza). Pt states she has had forceful vomiting "at least 20 times" since 2200 last night.

## 2018-01-10 NOTE — Discharge Instructions (Addendum)
You declined checking a third heart enzyme. Stop taking victoza. This may or may not be causing your vomiting. Let Dr. Dorris Fetch know you are doing this. Take the nausea medications as prescribed. Followup with your doctor. Return to the ED if you develop new or worsening symptoms.

## 2018-01-10 NOTE — ED Notes (Signed)
Pt given Diet Coke per request. Tolerating well at this time.

## 2018-01-14 ENCOUNTER — Encounter: Payer: Self-pay | Admitting: "Endocrinology

## 2018-01-14 ENCOUNTER — Ambulatory Visit: Payer: Medicaid Other | Admitting: "Endocrinology

## 2018-01-14 VITALS — BP 147/85 | HR 74 | Ht 63.5 in | Wt 160.0 lb

## 2018-01-14 DIAGNOSIS — E1142 Type 2 diabetes mellitus with diabetic polyneuropathy: Secondary | ICD-10-CM

## 2018-01-14 DIAGNOSIS — E11311 Type 2 diabetes mellitus with unspecified diabetic retinopathy with macular edema: Secondary | ICD-10-CM

## 2018-01-14 DIAGNOSIS — Z794 Long term (current) use of insulin: Secondary | ICD-10-CM

## 2018-01-14 DIAGNOSIS — E782 Mixed hyperlipidemia: Secondary | ICD-10-CM | POA: Diagnosis not present

## 2018-01-14 DIAGNOSIS — I10 Essential (primary) hypertension: Secondary | ICD-10-CM | POA: Diagnosis not present

## 2018-01-14 MED ORDER — INSULIN GLARGINE 100 UNIT/ML SOLOSTAR PEN: 54.0000 [IU] | PEN_INJECTOR | Freq: Every day | SUBCUTANEOUS | 2 refills | Status: DC

## 2018-01-14 MED ORDER — SITAGLIPTIN PHOSPHATE 50 MG PO TABS: 50.0000 mg | ORAL_TABLET | Freq: Every day | ORAL | 3 refills | Status: DC

## 2018-01-14 NOTE — Patient Instructions (Signed)

## 2018-01-14 NOTE — Progress Notes (Signed)
Endocrinology follow-up note  Subjective:    Patient ID: Allison [name], female    DOB: 06-Dec-1970, PCP [name], MD   Past Medical History:  Diagnosis Date  . Brain cyst    x2  . Cancer (HCC)    cervical   . Dyslipidemia   . Gastroparesis    possible  . Glaucoma   . Hypertension   . Insulin dependent diabetes mellitus ([place])   . Neuropathy   . Retinopathy   . 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 ABDOMINAL HYSTERECTOMY     partial   . EYE SURGERY     multiple  . RADIOLOGY WITH ANESTHESIA N/A 07/07/2017   Procedure: MRI WITH ANESTHESIA,BRAIN WITHOUT CONTRAST;  Surgeon: Radiologist, Medication, MD;  Location: [facility];  Service: Radiology;  Laterality: N/A;   Social History   Socioeconomic History  . Marital status: Divorced    Spouse name: Not on file  . Number of children: Not on file  . Years of education: Not on file  . Highest education level: Not on file  Occupational History  . Not on file  Social Needs  . Financial resource strain: Not on file  . Food insecurity:    Worry: Not on file    Inability: Not on file  . Transportation needs:    Medical: Not on file    Non-medical: Not on file  Tobacco Use  . Smoking status: Current Some Day Smoker    Packs/day: 0.25    Years: 26.00    Pack years: 6.50    Types: Cigarettes    Last attempt to quit: 05/05/2014    Years since quitting: 3.6  . Smokeless tobacco: Never Used  Substance and Sexual Activity  . Alcohol use: No  . Drug use: No  . Sexual activity: Yes    Birth control/protection: Surgical  Lifestyle  . Physical activity:    Days per week: Not on file    Minutes per session: Not on file  . Stress: Not on file  Relationships  . Social connections:    Talks on phone: Not on file    Gets together: Not on file    Attends religious service: Not on file    Active member of club or organization: Not on file    Attends meetings of  clubs or organizations: Not on file    Relationship status: Not on file  Other Topics Concern  . Not on file  Social History Narrative  . Not on file   Outpatient Encounter Medications as of 01/14/2018  Medication Sig  . ACCU-CHEK AVIVA PLUS test strip USE AS DIRECTED UP TO FOUR TIMES DAILY.  [name] albuterol (PROAIR HFA) 108 (90 BASE) MCG/ACT inhaler Inhale 2 puffs into the lungs every 4 (four) hours as needed for wheezing or shortness of breath.  . ALPHAGAN P 0.1 % SOLN Place 1 drop into both eyes 2 (two) times daily.  [name] ALPRAZolam (XANAX) 0.5 MG tablet Take 0.5 mg by mouth 4 (four) times daily as needed for anxiety.   [name] amphetamine-dextroamphetamine (ADDERALL) 10 MG tablet Take 10 mg by mouth 2 (two) times daily with a meal.  . aspirin EC 81 MG tablet Take 81 mg by mouth daily.  . blood glucose meter kit and supplies KIT Dispense based on patient and insurance preference. Use up to four times daily as directed. (FOR ICD 10 E11.65).  [name] conjugated estrogens (PREMARIN)  vaginal cream Place 6.96 Applicatorfuls vaginally 3 (three) times a week.  . furosemide (LASIX) 20 MG tablet Take 20 mg by mouth 2 (two) times daily as needed.   [name] HYDROcodone-acetaminophen (NORCO) 7.5-325 MG tablet Take 1 tablet by mouth every 4 (four) hours as needed for severe pain.  . Insulin Glargine (LANTUS SOLOSTAR) 100 UNIT/ML Solostar Pen Inject 54 Units into the skin at bedtime.  . Insulin Pen Needle (B-D ULTRAFINE III SHORT PEN) 31G X 8 MM MISC 1 each by Does not apply route as directed.  . loratadine (CLARITIN) 10 MG tablet Take 10 mg by mouth daily.  . Melatonin 5 MG TABS Take 1 tablet by mouth at bedtime.  . metoCLOPramide (REGLAN) 10 MG tablet Take 1 tablet (10 mg total) by mouth every 6 (six) hours as needed for nausea or vomiting.  . moxifloxacin (VIGAMOX) 0.5 % ophthalmic solution Place 1 drop into both eyes See admin instructions. Uses when having eye injections (every 6 weeks)  . Multiple Vitamin (MULTIVITAMIN)  tablet Take 1 tablet by mouth daily.  [name] omeprazole (PRILOSEC) 20 MG capsule Take 20 mg by mouth 2 (two) times daily before a meal.   . ondansetron (ZOFRAN ODT) 4 MG disintegrating tablet Take 1 tablet (4 mg total) by mouth every 8 (eight) hours as needed for nausea or vomiting.  . polyethylene glycol (MIRALAX / GLYCOLAX) packet Take 17 g by mouth daily.  . potassium chloride SA (K-DUR,KLOR-CON) 20 MEQ tablet Take 20 mEq by mouth 2 (two) times daily.   . sitaGLIPtin (JANUVIA) 50 MG tablet Take 1 tablet (50 mg total) by mouth daily.  . [DISCONTINUED] Insulin Glargine (LANTUS SOLOSTAR) 100 UNIT/ML Solostar Pen Inject 50 Units into the skin daily at 10 pm.  . [DISCONTINUED] liraglutide (VICTOZA) 18 MG/3ML SOPN Inject 0.1 mLs (0.6 mg total) into the skin daily.   Facility-Administered Encounter Medications as of 01/14/2018  Medication  . triamcinolone acetonide (TRIESENCE) 40 MG/ML subtenons injection 4 mg  . triamcinolone acetonide (TRIESENCE) 40 MG/ML subtenons injection 4 mg   ALLERGIES: Allergies  Allergen Reactions  . Cymbalta [Duloxetine Hcl] Shortness Of Breath  . Beta Adrenergic Blockers Palpitations  . Codeine Itching, Dermatitis and Other (See Comments)    TYLENOL #3   Welts and itching  . Lexapro [Escitalopram Oxalate] Hives and Palpitations  . Other Palpitations    Allergic to all beta blocker eye drops!!  . Doxycycline Other (See Comments)    UNSPECIFIED Non-specific Heart problems  . Oxycodone Itching   VACCINATION STATUS:  There is no immunization history on file for this patient.  Diabetes  She presents for her follow-up diabetic visit. She has type 2 diabetes mellitus. Onset time: She was diagnosed at approximate age of 70 years. Her disease course has been worsening. There are no hypoglycemic associated symptoms. Pertinent negatives for hypoglycemia include no confusion, headaches, pallor or seizures. Associated symptoms include blurred vision. Pertinent negatives for  diabetes include no chest pain, no polydipsia, no polyphagia and no polyuria. There are no hypoglycemic complications. Symptoms are worsening. Diabetic complications include retinopathy. Risk factors for coronary artery disease include diabetes mellitus, dyslipidemia, hypertension, tobacco exposure and sedentary lifestyle. Current diabetic treatment includes insulin injections and oral agent (monotherapy). She is compliant with treatment most of the time. Her weight is fluctuating minimally. She is following a generally unhealthy diet. When asked about meal planning, she reported none. She never participates in exercise. There is no change in her home blood glucose trend. Her breakfast blood glucose  range is generally 140-180 mg/dl. Her lunch blood glucose range is generally 140-180 mg/dl. Her dinner blood glucose range is generally 140-180 mg/dl. Her overall blood glucose range is 140-180 mg/dl. (Her recent A1c was 8.1%.  She returns with improved glycemic profile on higher dose of basal insulin.  She did not tolerate Victoza.) Eye exam is current.  Hyperlipidemia  This is a chronic problem. The current episode started more than 1 year ago. The problem is controlled. Exacerbating diseases include diabetes. Pertinent negatives include no chest pain, myalgias or shortness of breath. Risk factors for coronary artery disease include diabetes mellitus, dyslipidemia, hypertension and a sedentary lifestyle.  Hypertension  This is a chronic problem. The current episode started more than 1 year ago. The problem is controlled. Associated symptoms include blurred vision. Pertinent negatives include no chest pain, headaches, palpitations or shortness of breath. Risk factors for coronary artery disease include dyslipidemia, diabetes mellitus, obesity, sedentary lifestyle and smoking/tobacco exposure. Hypertensive end-organ damage includes retinopathy.     Review of Systems  Constitutional: Negative for chills, fever  and unexpected weight change.  HENT: Negative for trouble swallowing and voice change.   Eyes: Positive for blurred vision. Negative for visual disturbance.  Respiratory: Negative for cough, shortness of breath and wheezing.   Cardiovascular: Negative for chest pain, palpitations and leg swelling.  Gastrointestinal: Negative for diarrhea, nausea and vomiting.  Endocrine: Negative for cold intolerance, heat intolerance, polydipsia, polyphagia and polyuria.  Musculoskeletal: Negative for arthralgias and myalgias.  Skin: Negative for color change, pallor, rash and wound.  Neurological: Negative for seizures and headaches.  Psychiatric/Behavioral: Negative for confusion and suicidal ideas.    Objective:    BP (!) 147/85   Pulse 74   Ht 5' 3.5" (1.613 m)   Wt 160 lb (72.6 kg)   BMI 27.90 kg/m   Wt Readings from Last 3 Encounters:  01/14/18 160 lb (72.6 kg)  01/10/18 156 lb (70.8 kg)  12/31/17 159 lb (72.1 kg)    Physical Exam  Constitutional: She is oriented to person, place, and time. She appears well-developed.  HENT:  Head: Normocephalic and atraumatic.  Eyes: EOM are normal.  Neck: Normal range of motion. Neck supple. No tracheal deviation present. No thyromegaly present.  Cardiovascular: Normal rate.  Pulmonary/Chest: Effort normal.  Abdominal: There is no tenderness. There is no guarding.  Musculoskeletal: Normal range of motion. She exhibits no edema.  Neurological: She is alert and oriented to person, place, and time. No cranial nerve deficit. Coordination normal.  Skin: Skin is warm and dry. No rash noted. No erythema. No pallor.  Psychiatric: She has a normal mood and affect. Judgment normal.   Results for orders placed or performed during the hospital encounter of 01/10/18  CBC with Differential/Platelet  Result Value Ref Range   WBC 12.3 (H) 4.0 - 10.5 K/uL   RBC 4.83 3.87 - 5.11 MIL/uL   Hemoglobin 14.9 12.0 - 15.0 g/dL   HCT 42.7 36.0 - 46.0 %   MCV 88.4 78.0 -  100.0 fL   MCH 30.8 26.0 - 34.0 pg   MCHC 34.9 30.0 - 36.0 g/dL   RDW 12.2 11.5 - 15.5 %   Platelets 312 150 - 400 K/uL   Neutrophils Relative % 78 %   Neutro Abs 9.6 (H) 1.7 - 7.7 K/uL   Lymphocytes Relative 16 %   Lymphs Abs 1.9 0.7 - 4.0 K/uL   Monocytes Relative 5 %   Monocytes Absolute 0.6 0.1 - 1.0 K/uL  Eosinophils Relative 1 %   Eosinophils Absolute 0.2 0.0 - 0.7 K/uL   Basophils Relative 0 %   Basophils Absolute 0.0 0.0 - 0.1 K/uL  Comprehensive metabolic panel  Result Value Ref Range   Sodium 140 135 - 145 mmol/L   Potassium 3.6 3.5 - 5.1 mmol/L   Chloride 103 98 - 111 mmol/L   CO2 29 22 - 32 mmol/L   Glucose, Bld 160 (H) 70 - 99 mg/dL   BUN 14 6 - 20 mg/dL   Creatinine, Ser 0.62 0.44 - 1.00 mg/dL   Calcium 9.5 8.9 - 10.3 mg/dL   Total Protein 8.2 (H) 6.5 - 8.1 g/dL   Albumin 4.6 3.5 - 5.0 g/dL   AST 16 15 - 41 U/L   ALT 16 0 - 44 U/L   Alkaline Phosphatase 71 38 - 126 U/L   Total Bilirubin 0.9 0.3 - 1.2 mg/dL   GFR calc non Af Amer >60 >60 mL/min   GFR calc Af Amer >60 >60 mL/min   Anion gap 8 5 - 15  Lipase, blood  Result Value Ref Range   Lipase 27 11 - 51 U/L  Troponin I  Result Value Ref Range   Troponin I 0.04 (HH) <0.03 ng/mL  Troponin I  Result Value Ref Range   Troponin I 0.04 (HH) <0.03 ng/mL  CBG monitoring, ED  Result Value Ref Range   Glucose-Capillary 156 (H) 70 - 99 mg/dL   Diabetic Labs (most recent): Lab Results  Component Value Date   HGBA1C 8.1 (H) 12/28/2017   HGBA1C 8.1 (H) 09/23/2017   HGBA1C 7.4 (H) 06/23/2017   Lipid Panel     Component Value Date/Time   CHOL 147 04/20/2017 0943   TRIG 182 (H) 04/20/2017 0943   HDL 41 04/20/2017 0943   LDLCALC 70 04/20/2017 0943     Assessment & Plan:   1. 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 These are all discussed in detail with the patient.  -She returns with blood glucose of 156 over the last 7 days, did not tolerate Victoza.  Her recent labs showed A1c of 8.1%, she has history of high A1c of 14.7% in the past.      Recent labs reviewed, showing normal renal function.  - I have re-counseled the patient on diet management  by adopting a carbohydrate restricted / protein rich  Diet.  -  Suggestion is made for her to avoid simple carbohydrates  from her diet including Cakes, Sweet Desserts / Pastries, Ice Cream, Soda (diet and regular), Sweet Tea, Candies, Chips, Cookies, Store Bought Juices, Alcohol in Excess of  1-2 drinks a day, Artificial Sweeteners, and "Sugar-free" Products. This will help patient to have stable blood glucose profile and potentially avoid unintended weight gain.   - Patient is advised to stick to a routine mealtimes to eat 3 meals  a day and avoid unnecessary snacks ( to snack only to correct hypoglycemia).  - I have approached patient with the following individualized plan to manage diabetes and patient agrees.  -She states that Victoza caused nausea/vomiting which led to ER visit.  It was discontinued subsequently.  She has resumed Januvia 50 mg p.o. daily and tolerating  it.   Based on her presentation with near target glycemic profile she would not require prandial insulin at this time.    -I advised her to increase Lantus to 54 units nightly, continue to monitor blood glucose 2 times a day- daily before breakfast and at bedtime.    -If she continues to lose control, she will be considered for prandial insulin on subsequent visits.     - She does not tolerate metformin.  - Patient specific target  for A1c; LDL, HDL, Triglycerides, and  Waist Circumference were discussed in detail.  2) BP/HTN: Her blood pressure is  controlled to target.   3) Lipids/HPL: Her recent lipid panel showed controlled LDL at 70.  She is not on statins.  4)  Weight/Diet:  exercise, and carbohydrates information provided.  5) Chronic Care/Health Maintenance:  -Patient is  encouraged to continue to follow up with Ophthalmology, Podiatrist at least yearly or according to recommendations, and advised to quit smoking. I have recommended yearly flu vaccine and pneumonia vaccination at least every 5 years; moderate intensity exercise for up to 150 minutes weekly; and  sleep for at least 7 hours a day.  - I advised patient to maintain close follow up with [name], MD for primary care needs.  - Time spent with the patient: 25 min, of which >50% was spent in reviewing her blood glucose logs , discussing her hypo- and hyper-glycemic episodes, reviewing her current and  previous labs and insulin doses and developing a plan to avoid hypo- and hyper-glycemia. Please refer to Patient Instructions for Blood Glucose Monitoring and Insulin/Medications Dosing Guide"  in media tab for additional information. Stephens Shire participated in the discussions, expressed understanding, and voiced agreement with the above plans.  All questions were answered to her satisfaction. she is encouraged to contact clinic should she have any questions or concerns prior to her return visit.    Follow up plan: -Return in about 3 months (around 04/16/2018) for Follow up with Pre-visit Labs, Meter, and Logs.   Glade Lloyd, MD Phone: [phone]  Fax: [phone]  -  This note was partially dictated with voice recognition software. Similar sounding words can be transcribed inadequately or may not  be corrected upon review.  01/14/2018, 12:16 PM

## 2018-01-20 ENCOUNTER — Encounter (INDEPENDENT_AMBULATORY_CARE_PROVIDER_SITE_OTHER): Payer: Medicaid Other | Admitting: Ophthalmology

## 2018-01-21 ENCOUNTER — Emergency Department (HOSPITAL_COMMUNITY)
Admission: EM | Admit: 2018-01-21 | Discharge: 2018-01-21 | Disposition: A | Discharge status: Home / Self Care | Payer: Medicaid Other | Attending: Emergency Medicine | Admitting: Emergency Medicine

## 2018-01-21 ENCOUNTER — Other Ambulatory Visit: Payer: Self-pay

## 2018-01-21 ENCOUNTER — Encounter (HOSPITAL_COMMUNITY): Payer: Self-pay | Admitting: Emergency Medicine

## 2018-01-21 DIAGNOSIS — R222 Localized swelling, mass and lump, trunk: Secondary | ICD-10-CM | POA: Diagnosis present

## 2018-01-21 DIAGNOSIS — Z794 Long term (current) use of insulin: Secondary | ICD-10-CM | POA: Insufficient documentation

## 2018-01-21 DIAGNOSIS — L0231 Cutaneous abscess of buttock: Secondary | ICD-10-CM | POA: Diagnosis not present

## 2018-01-21 DIAGNOSIS — Z8541 Personal history of malignant neoplasm of cervix uteri: Secondary | ICD-10-CM | POA: Diagnosis not present

## 2018-01-21 DIAGNOSIS — E119 Type 2 diabetes mellitus without complications: Secondary | ICD-10-CM | POA: Diagnosis not present

## 2018-01-21 DIAGNOSIS — I10 Essential (primary) hypertension: Secondary | ICD-10-CM | POA: Diagnosis not present

## 2018-01-21 DIAGNOSIS — F17210 Nicotine dependence, cigarettes, uncomplicated: Secondary | ICD-10-CM | POA: Insufficient documentation

## 2018-01-21 DIAGNOSIS — Z79899 Other long term (current) drug therapy: Secondary | ICD-10-CM | POA: Insufficient documentation

## 2018-01-21 MED ORDER — POVIDONE-IODINE 10 % EX SOLN
CUTANEOUS | Status: DC | PRN
  Administered 2018-01-21: 1 via TOPICAL
  Filled 2018-01-21: qty 15

## 2018-01-21 MED ORDER — LIDOCAINE HCL (PF) 1 % IJ SOLN
5.0000 mL | Freq: Once | INTRAMUSCULAR | Status: AC
  Administered 2018-01-21: 5 mL
  Filled 2018-01-21: qty 6

## 2018-01-21 MED ORDER — SULFAMETHOXAZOLE-TRIMETHOPRIM 800-160 MG PO TABS: 1.0000 | ORAL_TABLET | Freq: Two times a day (BID) | ORAL | 0 refills | Status: AC

## 2018-01-21 MED ORDER — PENTAFLUOROPROP-TETRAFLUOROETH EX AERO
INHALATION_SPRAY | CUTANEOUS | Status: DC | PRN
Start: 2018-01-21 — End: 2018-01-21
  Filled 2018-01-21: qty 103.5

## 2018-01-21 NOTE — ED Triage Notes (Signed)
Abscess in sacral area x 2 days

## 2018-01-21 NOTE — Discharge Instructions (Signed)
As discussed, continue doing a warm water epsom salt soak for 10 minutes 3-4 times daily to keep this draining.  Take your entire course of the antibiotics.

## 2018-01-22 NOTE — ED Provider Notes (Signed)
[facility] EMERGENCY DEPARTMENT Provider Note   CSN: [id number] Arrival date & time: 01/21/18  0825     History   Chief Complaint Chief Complaint  Patient presents with  . Abscess    HPI Allison [name] is a 47 y.o. female.  The history is provided by the patient.  Abscess  Location:  Pelvis Pelvic abscess location:  R buttock Abscess quality: fluctuance, induration and painful   Abscess quality: not draining   Red streaking: no   Duration:  2 days Progression:  Worsening Pain details:    Quality:  Sharp   Severity:  Moderate   Timing:  Constant   Progression:  Worsening Chronicity:  New Context: diabetes   Context: not skin injury   Relieved by:  Nothing Worsened by:  Warm water soaks Ineffective treatments:  Warm compresses Associated symptoms: no fever, no nausea and no vomiting   Risk factors: prior abscess   Risk factors: no hx of MRSA     Past Medical History:  Diagnosis Date  . Brain cyst    x2  . Cancer (HCC)    cervical   . Dyslipidemia   . Gastroparesis    possible  . Glaucoma   . Hypertension   . Insulin dependent diabetes mellitus ([place])   . Neuropathy   . Retinopathy   . 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 Vasomotor flushing 10/04/2014  . 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 Diabetic neuropathy ([place]) 10/08/2012  . Mixed hyperlipidemia 10/08/2012  . Essential hypertension 10/08/2012  . Atypical chest pain 10/08/2012  . Palpitations 10/08/2012    Past Surgical History:  Procedure Laterality Date  . ABDOMINAL HYSTERECTOMY     partial   . EYE SURGERY     multiple  . RADIOLOGY WITH ANESTHESIA N/A 07/07/2017   Procedure: MRI WITH ANESTHESIA,BRAIN WITHOUT CONTRAST;  Surgeon: Radiologist, Medication, MD;  Location: [place];  Service: Radiology;  Laterality: N/A;     OB  History    Gravida  1   Para  1   Term      Preterm      AB      Living        SAB      TAB      Ectopic      Multiple      Live Births               Home Medications    Prior to Admission medications   Medication Sig Start Date End Date Taking? Authorizing Provider  ACCU-CHEK AVIVA PLUS test strip USE AS DIRECTED UP TO FOUR TIMES DAILY. 01/04/18   Cassandria Anger, MD  albuterol (PROAIR HFA) 108 (90 BASE) MCG/ACT inhaler Inhale 2 puffs into the lungs every 4 (four) hours as needed for wheezing or shortness of breath.    [provider]  ALPHAGAN P 0.1 % SOLN Place 1 drop into both eyes 2 (two) times daily. 05/02/17   [provider]  ALPRAZolam Duanne Moron) 0.5 MG tablet Take 0.5 mg by mouth 4 (four) times daily as needed for anxiety.     [provider]  amphetamine-dextroamphetamine (ADDERALL) 10 MG tablet Take 10 mg by mouth 2 (two) times daily with a meal.    [provider]  aspirin EC 81  MG tablet Take 81 mg by mouth daily.    [provider]  blood glucose meter kit and supplies KIT Dispense based on patient and insurance preference. Use up to four times daily as directed. (FOR ICD 10 E11.65). 07/16/15   Cassandria Anger, MD  conjugated estrogens (PREMARIN) vaginal cream Place 5.46 Applicatorfuls vaginally 3 (three) times a week. 10/10/16   Jonnie Kind, MD  furosemide (LASIX) 20 MG tablet Take 20 mg by mouth 2 (two) times daily as needed.     [provider]  HYDROcodone-acetaminophen (NORCO) 7.5-325 MG tablet Take 1 tablet by mouth every 4 (four) hours as needed for severe pain.    [provider]  Insulin Glargine (LANTUS SOLOSTAR) 100 UNIT/ML Solostar Pen Inject 54 Units into the skin at bedtime. 01/14/18   Cassandria Anger, MD  Insulin Pen Needle (B-D ULTRAFINE III SHORT PEN) 31G X 8 MM MISC 1 each by Does not apply route as directed. 12/31/17   Cassandria Anger, MD  loratadine  (CLARITIN) 10 MG tablet Take 10 mg by mouth daily.    [provider]  Melatonin 5 MG TABS Take 1 tablet by mouth at bedtime.    [provider]  metoCLOPramide (REGLAN) 10 MG tablet Take 1 tablet (10 mg total) by mouth every 6 (six) hours as needed for nausea or vomiting. 01/10/18   Rancour, Annie Main, MD  moxifloxacin (VIGAMOX) 0.5 % ophthalmic solution Place 1 drop into both eyes See admin instructions. Uses when having eye injections (every 6 weeks)    [provider]  Multiple Vitamin (MULTIVITAMIN) tablet Take 1 tablet by mouth daily.    [provider]  omeprazole (PRILOSEC) 20 MG capsule Take 20 mg by mouth 2 (two) times daily before a meal.     [provider]  ondansetron (ZOFRAN ODT) 4 MG disintegrating tablet Take 1 tablet (4 mg total) by mouth every 8 (eight) hours as needed for nausea or vomiting. 01/10/18   Rancour, Annie Main, MD  polyethylene glycol (MIRALAX / GLYCOLAX) packet Take 17 g by mouth daily.    [provider]  potassium chloride SA (K-DUR,KLOR-CON) 20 MEQ tablet Take 20 mEq by mouth 2 (two) times daily.     [provider]  sitaGLIPtin (JANUVIA) 50 MG tablet Take 1 tablet (50 mg total) by mouth daily. 01/14/18   Cassandria Anger, MD  sulfamethoxazole-trimethoprim (BACTRIM DS,SEPTRA DS) 800-160 MG tablet Take 1 tablet by mouth 2 (two) times daily for 10 days. 01/21/18 01/31/18  Evalee Jefferson, PA-C    Family History Family History  Problem Relation Age of Onset  . Diabetes Mother   . Hypertension Mother   . Cancer Father        brain   . Diabetes Father   . Stroke Father   . Iron deficiency Sister   . Hypertension Brother   . Hyperlipidemia Brother   . Cancer Other        family h/o  . Coronary artery disease Other        family h/o    Social History Social History   Tobacco Use  . Smoking status: Current Some Day Smoker    Packs/day: 0.25    Years: 26.00    Pack years: 6.50    Types: Cigarettes      Last attempt to quit: 05/05/2014    Years since quitting: 3.7  . Smokeless tobacco: Never Used  Substance Use Topics  . Alcohol use: No  .  Drug use: No     Allergies   Cymbalta [duloxetine hcl]; Beta adrenergic blockers; Codeine; Lexapro [escitalopram oxalate]; Other; Victoza [liraglutide]; Doxycycline; and Oxycodone   Review of Systems Review of Systems  Constitutional: Negative for chills and fever.  Respiratory: Negative for shortness of breath and wheezing.   Gastrointestinal: Negative for nausea and vomiting.  Skin: Positive for color change and wound.  Neurological: Negative for numbness.     Physical Exam Updated Vital Signs BP 131/63   Pulse 73   Temp 98.2 F (36.8 C) (Oral)   Resp 12   Ht 5' 3"  (1.6 m)   Wt 72.5 kg   SpO2 100%   BMI 28.31 kg/m   Physical Exam  Constitutional: She appears well-developed and well-nourished. No distress.  HENT:  Head: Normocephalic.  Neck: Neck supple.  Cardiovascular: Normal rate.  Pulmonary/Chest: Effort normal.  Musculoskeletal: Normal range of motion. She exhibits no edema.  Skin: There is erythema.  Fluctuant 3 cm raised abscess with a 3 cm surrounding indurated erythema right medial buttock, superior to the anus with no rectal communication. No drainage. Tenting.     ED Treatments / Results  Labs (all labs ordered are listed, but only abnormal results are displayed) Labs Reviewed - No data to display  EKG None  Radiology No results found.  Procedures Procedures (including critical care time)  INCISION AND DRAINAGE Performed by: Evalee Jefferson Consent: Verbal consent obtained. Risks and benefits: risks, benefits and alternatives were discussed Type: abscess  Body area: right buttock  Anesthesia: local infiltration  Incision was made with a scalpel.  Local anesthetic: lidocaine 1% without epinephrine  Anesthetic total: 5 ml  Complexity: complex Blunt dissection to break up  loculations  Drainage: purulent  Drainage amount: moderate  Packing material: no packing  Patient tolerance: Patient tolerated the procedure well with no immediate complications.     Medications Ordered in ED Medications  lidocaine (PF) (XYLOCAINE) 1 % injection 5 mL (5 mLs Other Given 01/21/18 1000)     Initial Impression / Assessment and Plan / ED Course  I have reviewed the triage vital signs and the nursing notes.  Pertinent labs & imaging results that were available during my care of the patient were reviewed by me and considered in my medical decision making (see chart for details).     Home tx including warm soaks discussed, abx started, strict return or check by pcp for any worsened sx discussed. Wound culture obtained.  Final Clinical Impressions(s) / ED Diagnoses   Final diagnoses:  Abscess of buttock, right    ED Discharge Orders         Ordered    sulfamethoxazole-trimethoprim (BACTRIM DS,SEPTRA DS) 800-160 MG tablet  2 times daily     01/21/18 1102           Evalee Jefferson, Hershal Coria 01/22/18 2204    Milton Ferguson, MD 01/23/18 [phone]

## 2018-02-04 ENCOUNTER — Other Ambulatory Visit: Payer: Self-pay | Admitting: "Endocrinology

## 2018-02-04 ENCOUNTER — Telehealth: Payer: Self-pay

## 2018-02-04 MED ORDER — FUROSEMIDE 20 MG PO TABS: 20.0000 mg | ORAL_TABLET | Freq: Every day | ORAL | 1 refills | Status: DC

## 2018-02-04 NOTE — Telephone Encounter (Signed)
She will need lasix. I will send a rx to her pharmacy.

## 2018-02-04 NOTE — Telephone Encounter (Signed)
On 01-14-18 pt states that she weighed 160lbs. Today she weighs 168lbs. She states that Dr Dorris Fetch told her that to let him know if she started to have rapid weight gain.

## 2018-02-04 NOTE — Telephone Encounter (Signed)
Pt.notified

## 2018-02-15 ENCOUNTER — Encounter (INDEPENDENT_AMBULATORY_CARE_PROVIDER_SITE_OTHER): Payer: Medicaid Other | Admitting: Ophthalmology

## 2018-02-15 DIAGNOSIS — E103513 Type 1 diabetes mellitus with proliferative diabetic retinopathy with macular edema, bilateral: Secondary | ICD-10-CM | POA: Diagnosis not present

## 2018-02-15 DIAGNOSIS — H35033 Hypertensive retinopathy, bilateral: Secondary | ICD-10-CM

## 2018-02-15 DIAGNOSIS — I10 Essential (primary) hypertension: Secondary | ICD-10-CM | POA: Diagnosis not present

## 2018-02-15 DIAGNOSIS — E10311 Type 1 diabetes mellitus with unspecified diabetic retinopathy with macular edema: Secondary | ICD-10-CM | POA: Diagnosis not present

## 2018-02-15 DIAGNOSIS — H43813 Vitreous degeneration, bilateral: Secondary | ICD-10-CM

## 2018-03-29 ENCOUNTER — Encounter (INDEPENDENT_AMBULATORY_CARE_PROVIDER_SITE_OTHER): Payer: Medicaid Other | Admitting: Ophthalmology

## 2018-03-30 ENCOUNTER — Encounter (INDEPENDENT_AMBULATORY_CARE_PROVIDER_SITE_OTHER): Payer: Medicaid Other | Admitting: Ophthalmology

## 2018-03-30 DIAGNOSIS — I10 Essential (primary) hypertension: Secondary | ICD-10-CM

## 2018-03-30 DIAGNOSIS — E10311 Type 1 diabetes mellitus with unspecified diabetic retinopathy with macular edema: Secondary | ICD-10-CM

## 2018-03-30 DIAGNOSIS — E103513 Type 1 diabetes mellitus with proliferative diabetic retinopathy with macular edema, bilateral: Secondary | ICD-10-CM

## 2018-03-30 DIAGNOSIS — H35033 Hypertensive retinopathy, bilateral: Secondary | ICD-10-CM | POA: Diagnosis not present

## 2018-03-30 DIAGNOSIS — H43813 Vitreous degeneration, bilateral: Secondary | ICD-10-CM

## 2018-03-31 ENCOUNTER — Encounter (INDEPENDENT_AMBULATORY_CARE_PROVIDER_SITE_OTHER): Payer: Medicaid Other | Admitting: Ophthalmology

## 2018-03-31 DIAGNOSIS — S0502XA Injury of conjunctiva and corneal abrasion without foreign body, left eye, initial encounter: Secondary | ICD-10-CM

## 2018-04-01 ENCOUNTER — Encounter (INDEPENDENT_AMBULATORY_CARE_PROVIDER_SITE_OTHER): Payer: Medicaid Other | Admitting: Ophthalmology

## 2018-04-01 DIAGNOSIS — S0501XA Injury of conjunctiva and corneal abrasion without foreign body, right eye, initial encounter: Secondary | ICD-10-CM

## 2018-04-13 ENCOUNTER — Other Ambulatory Visit: Payer: Self-pay | Admitting: "Endocrinology

## 2018-04-13 DIAGNOSIS — E1165 Type 2 diabetes mellitus with hyperglycemia: Secondary | ICD-10-CM

## 2018-04-14 LAB — HEMOGLOBIN A1C: Hemoglobin A1C: 7.8

## 2018-04-14 LAB — BASIC METABOLIC PANEL
BUN: 10 (ref 4–21)
CREATININE: 0.8 (ref 0.5–1.1)

## 2018-04-16 ENCOUNTER — Encounter: Payer: Self-pay | Admitting: "Endocrinology

## 2018-04-16 ENCOUNTER — Ambulatory Visit: Payer: Medicaid Other | Admitting: "Endocrinology

## 2018-04-16 VITALS — BP 135/88 | HR 82 | Ht 63.5 in | Wt 161.0 lb

## 2018-04-16 DIAGNOSIS — I10 Essential (primary) hypertension: Secondary | ICD-10-CM | POA: Diagnosis not present

## 2018-04-16 DIAGNOSIS — Z794 Long term (current) use of insulin: Secondary | ICD-10-CM

## 2018-04-16 DIAGNOSIS — E11311 Type 2 diabetes mellitus with unspecified diabetic retinopathy with macular edema: Secondary | ICD-10-CM

## 2018-04-16 DIAGNOSIS — E782 Mixed hyperlipidemia: Secondary | ICD-10-CM | POA: Diagnosis not present

## 2018-04-16 MED ORDER — METFORMIN HCL ER 500 MG PO TB24: 500.0000 mg | ORAL_TABLET | Freq: Every day | ORAL | 3 refills | Status: DC

## 2018-04-16 MED ORDER — INSULIN GLARGINE 100 UNIT/ML SOLOSTAR PEN: 50.0000 [IU] | PEN_INJECTOR | Freq: Every day | SUBCUTANEOUS | 2 refills | Status: DC

## 2018-04-16 NOTE — Patient Instructions (Signed)

## 2018-04-16 NOTE — Progress Notes (Signed)
Endocrinology follow-up note  Subjective:    Patient ID: Allison [name], female    DOB: 07-May-1971, PCP [name], MD   Past Medical History:  Diagnosis Date  . Brain cyst    x2  . Cancer (HCC)    cervical   . Dyslipidemia   . Gastroparesis    possible  . Glaucoma   . Hypertension   . Insulin dependent diabetes mellitus ([place])   . Neuropathy   . Retinopathy   . 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 ABDOMINAL HYSTERECTOMY     partial   . EYE SURGERY     multiple  . RADIOLOGY WITH ANESTHESIA N/A 07/07/2017   Procedure: MRI WITH ANESTHESIA,BRAIN WITHOUT CONTRAST;  Surgeon: Radiologist, Medication, MD;  Location: [facility];  Service: Radiology;  Laterality: N/A;   Social History   Socioeconomic History  . Marital status: Divorced    Spouse name: Not on file  . Number of children: Not on file  . Years of education: Not on file  . Highest education level: Not on file  Occupational History  . Not on file  Social Needs  . Financial resource strain: Not on file  . Food insecurity:    Worry: Not on file    Inability: Not on file  . Transportation needs:    Medical: Not on file    Non-medical: Not on file  Tobacco Use  . Smoking status: Current Some Day Smoker    Packs/day: 0.25    Years: 26.00    Pack years: 6.50    Types: Cigarettes    Last attempt to quit: 05/05/2014    Years since quitting: 3.9  . Smokeless tobacco: Never Used  Substance and Sexual Activity  . Alcohol use: No  . Drug use: No  . Sexual activity: Yes    Birth control/protection: Surgical  Lifestyle  . Physical activity:    Days per week: Not on file    Minutes per session: Not on file  . Stress: Not on file  Relationships  . Social connections:    Talks on phone: Not on file    Gets together: Not on file    Attends religious service: Not on file    Active member of club or organization: Not on file    Attends meetings of  clubs or organizations: Not on file    Relationship status: Not on file  Other Topics Concern  . Not on file  Social History Narrative  . Not on file   Outpatient Encounter Medications as of 04/16/2018  Medication Sig  . ACCU-CHEK AVIVA PLUS test strip USE AS DIRECTED UP TO FOUR TIMES DAILY.  [name] albuterol (PROAIR HFA) 108 (90 BASE) MCG/ACT inhaler Inhale 2 puffs into the lungs every 4 (four) hours as needed for wheezing or shortness of breath.  . ALPHAGAN P 0.1 % SOLN Place 1 drop into both eyes 2 (two) times daily.  [name] ALPRAZolam (XANAX) 0.5 MG tablet Take 0.5 mg by mouth 4 (four) times daily as needed for anxiety.   [name] amphetamine-dextroamphetamine (ADDERALL) 10 MG tablet Take 10 mg by mouth 2 (two) times daily with a meal.  . aspirin EC 81 MG tablet Take 81 mg by mouth daily.  . blood glucose meter kit and supplies KIT Dispense based on patient and insurance preference. Use up to four times daily as directed. (FOR ICD 10 E11.65).  [name] conjugated estrogens (PREMARIN)  vaginal cream Place 7.78 Applicatorfuls vaginally 3 (three) times a week.  . furosemide (LASIX) 20 MG tablet Take 20 mg by mouth 2 (two) times daily as needed.   . furosemide (LASIX) 20 MG tablet Take 1 tablet (20 mg total) by mouth daily.  [name] HYDROcodone-acetaminophen (NORCO) 7.5-325 MG tablet Take 1 tablet by mouth every 4 (four) hours as needed for severe pain.  . Insulin Glargine (LANTUS SOLOSTAR) 100 UNIT/ML Solostar Pen Inject 50 Units into the skin at bedtime.  . Insulin Pen Needle (B-D ULTRAFINE III SHORT PEN) 31G X 8 MM MISC 1 each by Does not apply route as directed.  . loratadine (CLARITIN) 10 MG tablet Take 10 mg by mouth daily.  . Melatonin 5 MG TABS Take 1 tablet by mouth at bedtime.  . metFORMIN (GLUCOPHAGE XR) 500 MG 24 hr tablet Take 1 tablet (500 mg total) by mouth daily with breakfast.  . metoCLOPramide (REGLAN) 10 MG tablet Take 1 tablet (10 mg total) by mouth every 6 (six) hours as needed for nausea or vomiting.   . moxifloxacin (VIGAMOX) 0.5 % ophthalmic solution Place 1 drop into both eyes See admin instructions. Uses when having eye injections (every 6 weeks)  . Multiple Vitamin (MULTIVITAMIN) tablet Take 1 tablet by mouth daily.  [name] omeprazole (PRILOSEC) 20 MG capsule Take 20 mg by mouth 2 (two) times daily before a meal.   . ondansetron (ZOFRAN ODT) 4 MG disintegrating tablet Take 1 tablet (4 mg total) by mouth every 8 (eight) hours as needed for nausea or vomiting.  . polyethylene glycol (MIRALAX / GLYCOLAX) packet Take 17 g by mouth daily.  . potassium chloride SA (K-DUR,KLOR-CON) 20 MEQ tablet Take 20 mEq by mouth 2 (two) times daily.   . sitaGLIPtin (JANUVIA) 50 MG tablet Take 1 tablet (50 mg total) by mouth daily.  . [DISCONTINUED] Insulin Glargine (LANTUS SOLOSTAR) 100 UNIT/ML Solostar Pen Inject 54 Units into the skin at bedtime.   Facility-Administered Encounter Medications as of 04/16/2018  Medication  . triamcinolone acetonide (TRIESENCE) 40 MG/ML subtenons injection 4 mg  . triamcinolone acetonide (TRIESENCE) 40 MG/ML subtenons injection 4 mg   ALLERGIES: Allergies  Allergen Reactions  . Cymbalta [Duloxetine Hcl] Shortness Of Breath  . Beta Adrenergic Blockers Palpitations  . Codeine Itching, Dermatitis and Other (See Comments)    TYLENOL #3   Welts and itching  . Lexapro [Escitalopram Oxalate] Hives and Palpitations  . Other Palpitations    Allergic to all beta blocker eye drops!!  . Victoza [Liraglutide] Nausea And Vomiting  . Doxycycline Other (See Comments)    UNSPECIFIED Non-specific Heart problems  . Oxycodone Itching   VACCINATION STATUS:  There is no immunization history on file for this patient.  Diabetes  She presents for her follow-up diabetic visit. She has type 2 diabetes mellitus. Onset time: She was diagnosed at approximate age of 42 years. Her disease course has been improving. There are no hypoglycemic associated symptoms. Pertinent negatives for  hypoglycemia include no confusion, headaches, pallor or seizures. Associated symptoms include blurred vision. Pertinent negatives for diabetes include no chest pain, no polydipsia, no polyphagia and no polyuria. There are no hypoglycemic complications. Symptoms are improving. Diabetic complications include retinopathy. Risk factors for coronary artery disease include diabetes mellitus, dyslipidemia, hypertension, tobacco exposure and sedentary lifestyle. Current diabetic treatment includes insulin injections and oral agent (monotherapy). She is compliant with treatment most of the time. Her weight is fluctuating minimally. She is following a generally unhealthy diet. When asked about  meal planning, she reported none. She never participates in exercise. There is no change in her home blood glucose trend. Her breakfast blood glucose range is generally 140-180 mg/dl. Her bedtime blood glucose range is generally 180-200 mg/dl. Her overall blood glucose range is 180-200 mg/dl. Eye exam is current.  Hyperlipidemia  This is a chronic problem. The current episode started more than 1 year ago. The problem is controlled. Exacerbating diseases include diabetes. Pertinent negatives include no chest pain, myalgias or shortness of breath. Risk factors for coronary artery disease include diabetes mellitus, dyslipidemia, hypertension and a sedentary lifestyle.  Hypertension  This is a chronic problem. The current episode started more than 1 year ago. The problem is controlled. Associated symptoms include blurred vision. Pertinent negatives include no chest pain, headaches, palpitations or shortness of breath. Risk factors for coronary artery disease include dyslipidemia, diabetes mellitus, obesity, sedentary lifestyle and smoking/tobacco exposure. Hypertensive end-organ damage includes retinopathy.     Review of Systems  Constitutional: Negative for chills, fever and unexpected weight change.  HENT: Negative for trouble  swallowing and voice change.   Eyes: Positive for blurred vision. Negative for visual disturbance.  Respiratory: Negative for cough, shortness of breath and wheezing.   Cardiovascular: Negative for chest pain, palpitations and leg swelling.  Gastrointestinal: Negative for diarrhea, nausea and vomiting.  Endocrine: Negative for cold intolerance, heat intolerance, polydipsia, polyphagia and polyuria.  Musculoskeletal: Negative for arthralgias and myalgias.  Skin: Negative for color change, pallor, rash and wound.  Neurological: Negative for seizures and headaches.  Psychiatric/Behavioral: Negative for confusion and suicidal ideas.    Objective:    BP 135/88   Pulse 82   Ht 5' 3.5" (1.613 m)   Wt 161 lb (73 kg)   BMI 28.07 kg/m   Wt Readings from Last 3 Encounters:  04/16/18 161 lb (73 kg)  01/21/18 159 lb 13.3 oz (72.5 kg)  01/14/18 160 lb (72.6 kg)    Physical Exam  Constitutional: She is oriented to person, place, and time. She appears well-developed.  HENT:  Head: Normocephalic and atraumatic.  Eyes: EOM are normal.  Neck: Normal range of motion. Neck supple. No tracheal deviation present. No thyromegaly present.  Cardiovascular: Normal rate.  Pulmonary/Chest: Effort normal.  Abdominal: There is no tenderness. There is no guarding.  Musculoskeletal: Normal range of motion. She exhibits no edema.  Neurological: She is alert and oriented to person, place, and time. No cranial nerve deficit. Coordination normal.  Skin: Skin is warm and dry. No rash noted. No erythema. No pallor.  Psychiatric: She has a normal mood and affect. Judgment normal.   Results for orders placed or performed during the hospital encounter of 01/10/18  CBC with Differential/Platelet  Result Value Ref Range   WBC 12.3 (H) 4.0 - 10.5 K/uL   RBC 4.83 3.87 - 5.11 MIL/uL   Hemoglobin 14.9 12.0 - 15.0 g/dL   HCT 42.7 36.0 - 46.0 %   MCV 88.4 78.0 - 100.0 fL   MCH 30.8 26.0 - 34.0 pg   MCHC 34.9 30.0 -  36.0 g/dL   RDW 12.2 11.5 - 15.5 %   Platelets 312 150 - 400 K/uL   Neutrophils Relative % 78 %   Neutro Abs 9.6 (H) 1.7 - 7.7 K/uL   Lymphocytes Relative 16 %   Lymphs Abs 1.9 0.7 - 4.0 K/uL   Monocytes Relative 5 %   Monocytes Absolute 0.6 0.1 - 1.0 K/uL   Eosinophils Relative 1 %   Eosinophils  Absolute 0.2 0.0 - 0.7 K/uL   Basophils Relative 0 %   Basophils Absolute 0.0 0.0 - 0.1 K/uL  Comprehensive metabolic panel  Result Value Ref Range   Sodium 140 135 - 145 mmol/L   Potassium 3.6 3.5 - 5.1 mmol/L   Chloride 103 98 - 111 mmol/L   CO2 29 22 - 32 mmol/L   Glucose, Bld 160 (H) 70 - 99 mg/dL   BUN 14 6 - 20 mg/dL   Creatinine, Ser 0.62 0.44 - 1.00 mg/dL   Calcium 9.5 8.9 - 10.3 mg/dL   Total Protein 8.2 (H) 6.5 - 8.1 g/dL   Albumin 4.6 3.5 - 5.0 g/dL   AST 16 15 - 41 U/L   ALT 16 0 - 44 U/L   Alkaline Phosphatase 71 38 - 126 U/L   Total Bilirubin 0.9 0.3 - 1.2 mg/dL   GFR calc non Af Amer >60 >60 mL/min   GFR calc Af Amer >60 >60 mL/min   Anion gap 8 5 - 15  Lipase, blood  Result Value Ref Range   Lipase 27 11 - 51 U/L  Troponin I  Result Value Ref Range   Troponin I 0.04 (HH) <0.03 ng/mL  Troponin I  Result Value Ref Range   Troponin I 0.04 (HH) <0.03 ng/mL  CBG monitoring, ED  Result Value Ref Range   Glucose-Capillary 156 (H) 70 - 99 mg/dL   Diabetic Labs (most recent): Lab Results  Component Value Date   HGBA1C 8.1 (H) 12/28/2017   HGBA1C 8.1 (H) 09/23/2017   HGBA1C 7.4 (H) 06/23/2017   Lipid Panel     Component Value Date/Time   CHOL 147 04/20/2017 0943   TRIG 182 (H) 04/20/2017 0943   HDL 41 04/20/2017 0943   LDLCALC 70 04/20/2017 0943     Assessment & Plan:   1. 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 These are all discussed in detail with the patient.  -She returns with blood glucose average of 199 for the last 30 days, her A1c is improving to 7.8% from 8.1%.  He reports some rare and random mild hypoglycemia at fasting.     Recent labs reviewed, showing normal renal function.  - I have re-counseled the patient on diet management  by adopting a carbohydrate restricted / protein rich  Diet.  -  Suggestion is made for her to avoid simple carbohydrates  from her diet including Cakes, Sweet Desserts / Pastries, Ice Cream, Soda (diet and regular), Sweet Tea, Candies, Chips, Cookies, Store Bought Juices, Alcohol in Excess of  1-2 drinks a day, Artificial Sweeteners, and "Sugar-free" Products. This will help patient to have stable blood glucose profile and potentially avoid unintended weight gain.  - Patient is advised to stick to a routine mealtimes to eat 3 meals  a day and avoid unnecessary snacks ( to snack only to correct hypoglycemia).  - I have approached patient with the following individualized plan to manage diabetes and patient agrees.  -She to need to tolerate Januvia.  She is advised to continue Januvia 50 mg p.o. every morning with breakfast.  -She is interested to retry metformin.  I discussed and restarted metformin 500 mg ER p.o. twice daily  after breakfast and supper.  -I discussed and lowered her Lantus to 50 units nightly to avoid fasting hypoglycemia, she will continue to monitor blood glucose at least 4 times before meals and at bedtime and return in 2 -3 weeks for reevaluation.  -She will be considered for prandial insulin if she continues to lose control of glycemia, or if she does not tolerate metformin.  She did not tolerate Victoza on previous attempt.  - Patient specific target  for A1c; LDL, HDL, Triglycerides, and  Waist Circumference were discussed in detail.  2) BP/HTN: Her blood  pressure is controlled to target.  3) Lipids/HPL: Her recent lipid panel showed controlled LDL at 70.  She is not on statins.  4)  Weight/Diet:  exercise, and carbohydrates information provided.  5) Chronic Care/Health Maintenance:  -Patient is  encouraged to continue to follow up with Ophthalmology, Podiatrist at least yearly or according to recommendations, and advised to quit smoking. I have recommended yearly flu vaccine and pneumonia vaccination at least every 5 years; moderate intensity exercise for up to 150 minutes weekly; and  sleep for at least 7 hours a day.  - I advised patient to maintain close follow up with [name], MD for primary care needs.   - Time spent with the patient: 25 min, of which >50% was spent in reviewing her blood glucose logs , discussing her hypo- and hyper-glycemic episodes, reviewing her current and  previous labs and insulin doses and developing a plan to avoid hypo- and hyper-glycemia. Please refer to Patient Instructions for Blood Glucose Monitoring and Insulin/Medications Dosing Guide"  in media tab for additional information. Stephens Shire participated in the discussions, expressed understanding, and voiced agreement with the above plans.  All questions were answered to her satisfaction. she is encouraged to contact clinic should she have any questions or concerns prior to her return visit.   Follow up plan: -Return in about 3 weeks (around 05/07/2018) for Follow up with Meter and Logs Only - no Labs.   Glade Lloyd, MD Phone: [phone]  Fax: [phone]  -  This note was partially dictated with voice recognition software. Similar sounding words can be transcribed inadequately or may not  be corrected upon review.  04/16/2018, 12:10 PM

## 2018-04-28 ENCOUNTER — Encounter (INDEPENDENT_AMBULATORY_CARE_PROVIDER_SITE_OTHER): Payer: Medicaid Other | Admitting: Ophthalmology

## 2018-04-28 DIAGNOSIS — H35033 Hypertensive retinopathy, bilateral: Secondary | ICD-10-CM

## 2018-04-28 DIAGNOSIS — I10 Essential (primary) hypertension: Secondary | ICD-10-CM | POA: Diagnosis not present

## 2018-04-28 DIAGNOSIS — E10311 Type 1 diabetes mellitus with unspecified diabetic retinopathy with macular edema: Secondary | ICD-10-CM

## 2018-04-28 DIAGNOSIS — E103513 Type 1 diabetes mellitus with proliferative diabetic retinopathy with macular edema, bilateral: Secondary | ICD-10-CM

## 2018-04-28 DIAGNOSIS — H43813 Vitreous degeneration, bilateral: Secondary | ICD-10-CM

## 2018-05-07 ENCOUNTER — Ambulatory Visit (INDEPENDENT_AMBULATORY_CARE_PROVIDER_SITE_OTHER): Payer: Medicaid Other | Admitting: "Endocrinology

## 2018-05-07 ENCOUNTER — Encounter: Payer: Self-pay | Admitting: "Endocrinology

## 2018-05-07 VITALS — BP 120/78 | HR 73 | Ht 63.5 in | Wt 158.0 lb

## 2018-05-07 DIAGNOSIS — Z794 Long term (current) use of insulin: Secondary | ICD-10-CM | POA: Diagnosis not present

## 2018-05-07 DIAGNOSIS — I10 Essential (primary) hypertension: Secondary | ICD-10-CM

## 2018-05-07 DIAGNOSIS — E782 Mixed hyperlipidemia: Secondary | ICD-10-CM | POA: Diagnosis not present

## 2018-05-07 DIAGNOSIS — E11311 Type 2 diabetes mellitus with unspecified diabetic retinopathy with macular edema: Secondary | ICD-10-CM

## 2018-05-07 NOTE — Patient Instructions (Signed)

## 2018-05-07 NOTE — Progress Notes (Signed)
Endocrinology follow-up note  Subjective:    Patient ID: Allison [name], female    DOB: [birth year], PCP [name], MD   Past Medical History:  Diagnosis Date  . Brain cyst    x2  . Cancer (HCC)    cervical   . Dyslipidemia   . Gastroparesis    possible  . Glaucoma   . Hypertension   . Insulin dependent diabetes mellitus ([place])   . Neuropathy   . Retinopathy   . 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 ABDOMINAL HYSTERECTOMY     partial   . EYE SURGERY     multiple  . RADIOLOGY WITH ANESTHESIA N/A 07/07/2017   Procedure: MRI WITH ANESTHESIA,BRAIN WITHOUT CONTRAST;  Surgeon: Radiologist, Medication, MD;  Location: [facility];  Service: Radiology;  Laterality: N/A;   Social History   Socioeconomic History  . Marital status: Divorced    Spouse name: Not on file  . Number of children: Not on file  . Years of education: Not on file  . Highest education level: Not on file  Occupational History  . Not on file  Social Needs  . Financial resource strain: Not on file  . Food insecurity:    Worry: Not on file    Inability: Not on file  . Transportation needs:    Medical: Not on file    Non-medical: Not on file  Tobacco Use  . Smoking status: Current Some Day Smoker    Packs/day: 0.25    Years: 26.00    Pack years: 6.50    Types: Cigarettes    Last attempt to quit: 05/05/2014    Years since quitting: 4.0  . Smokeless tobacco: Never Used  Substance and Sexual Activity  . Alcohol use: No  . Drug use: No  . Sexual activity: Yes    Birth control/protection: Surgical  Lifestyle  . Physical activity:    Days per week: Not on file    Minutes per session: Not on file  . Stress: Not on file  Relationships  . Social connections:    Talks on phone: Not on file    Gets together: Not on file    Attends religious service: Not on file    Active member of club or organization: Not on file    Attends meetings of  clubs or organizations: Not on file    Relationship status: Not on file  Other Topics Concern  . Not on file  Social History Narrative  . Not on file   Outpatient Encounter Medications as of 05/07/2018  Medication Sig  . ACCU-CHEK AVIVA PLUS test strip USE AS DIRECTED UP TO FOUR TIMES DAILY.  [name] albuterol (PROAIR HFA) 108 (90 BASE) MCG/ACT inhaler Inhale 2 puffs into the lungs every 4 (four) hours as needed for wheezing or shortness of breath.  . ALPHAGAN P 0.1 % SOLN Place 1 drop into both eyes 2 (two) times daily.  [name] ALPRAZolam (XANAX) 0.5 MG tablet Take 0.5 mg by mouth 4 (four) times daily as needed for anxiety.   [name] amphetamine-dextroamphetamine (ADDERALL) 10 MG tablet Take 10 mg by mouth 2 (two) times daily with a meal.  . aspirin EC 81 MG tablet Take 81 mg by mouth daily.  . blood glucose meter kit and supplies KIT Dispense based on patient and insurance preference. Use up to four times daily as directed. (FOR ICD 10 E11.65).  [name] conjugated estrogens (PREMARIN)  vaginal cream Place 4.49 Applicatorfuls vaginally 3 (three) times a week.  . furosemide (LASIX) 20 MG tablet Take 20 mg by mouth 2 (two) times daily as needed.   [name] HYDROcodone-acetaminophen (NORCO) 7.5-325 MG tablet Take 1 tablet by mouth every 4 (four) hours as needed for severe pain.  . Insulin Glargine (LANTUS SOLOSTAR) 100 UNIT/ML Solostar Pen Inject 50 Units into the skin at bedtime.  . Insulin Pen Needle (B-D ULTRAFINE III SHORT PEN) 31G X 8 MM MISC 1 each by Does not apply route as directed.  . loratadine (CLARITIN) 10 MG tablet Take 10 mg by mouth daily.  . Melatonin 5 MG TABS Take 1 tablet by mouth at bedtime.  . metFORMIN (GLUCOPHAGE XR) 500 MG 24 hr tablet Take 1 tablet (500 mg total) by mouth daily with breakfast.  . metoCLOPramide (REGLAN) 10 MG tablet Take 1 tablet (10 mg total) by mouth every 6 (six) hours as needed for nausea or vomiting.  . moxifloxacin (VIGAMOX) 0.5 % ophthalmic solution Place 1 drop into both  eyes See admin instructions. Uses when having eye injections (every 6 weeks)  . Multiple Vitamin (MULTIVITAMIN) tablet Take 1 tablet by mouth daily.  [name] omeprazole (PRILOSEC) 20 MG capsule Take 20 mg by mouth 2 (two) times daily before a meal.   . ondansetron (ZOFRAN ODT) 4 MG disintegrating tablet Take 1 tablet (4 mg total) by mouth every 8 (eight) hours as needed for nausea or vomiting.  . polyethylene glycol (MIRALAX / GLYCOLAX) packet Take 17 g by mouth daily.  . potassium chloride SA (K-DUR,KLOR-CON) 20 MEQ tablet Take 20 mEq by mouth 2 (two) times daily.   . sitaGLIPtin (JANUVIA) 50 MG tablet Take 1 tablet (50 mg total) by mouth daily.  . [DISCONTINUED] furosemide (LASIX) 20 MG tablet Take 1 tablet (20 mg total) by mouth daily.   Facility-Administered Encounter Medications as of 05/07/2018  Medication  . triamcinolone acetonide (TRIESENCE) 40 MG/ML subtenons injection 4 mg  . triamcinolone acetonide (TRIESENCE) 40 MG/ML subtenons injection 4 mg   ALLERGIES: Allergies  Allergen Reactions  . Cymbalta [Duloxetine Hcl] Shortness Of Breath  . Beta Adrenergic Blockers Palpitations  . Codeine Itching, Dermatitis and Other (See Comments)    TYLENOL #3   Welts and itching  . Lexapro [Escitalopram Oxalate] Hives and Palpitations  . Other Palpitations    Allergic to all beta blocker eye drops!!  . Victoza [Liraglutide] Nausea And Vomiting  . Doxycycline Other (See Comments)    UNSPECIFIED Non-specific Heart problems  . Oxycodone Itching   VACCINATION STATUS:  There is no immunization history on file for this patient.  Diabetes  She presents for her follow-up diabetic visit. She has type 2 diabetes mellitus. Onset time: She was diagnosed at approximate age of 62 years. Her disease course has been improving. There are no hypoglycemic associated symptoms. Pertinent negatives for hypoglycemia include no confusion, headaches, pallor or seizures. Pertinent negatives for diabetes include no  blurred vision, no chest pain, no polydipsia, no polyphagia and no polyuria. There are no hypoglycemic complications. Symptoms are improving. Diabetic complications include retinopathy. Risk factors for coronary artery disease include diabetes mellitus, dyslipidemia, hypertension, tobacco exposure and sedentary lifestyle. Current diabetic treatment includes insulin injections and oral agent (monotherapy). She is compliant with treatment most of the time. Her weight is fluctuating minimally. She is following a generally unhealthy diet. When asked about meal planning, she reported none. She never participates in exercise. There is no change in her home blood glucose trend.  Her breakfast blood glucose range is generally 140-180 mg/dl. Her bedtime blood glucose range is generally 140-180 mg/dl. Her overall blood glucose range is 140-180 mg/dl. (She returns with 7-day average of 160, 14-day average of 168.) Eye exam is current.  Hyperlipidemia  This is a chronic problem. The current episode started more than 1 year ago. The problem is controlled. Exacerbating diseases include diabetes. Pertinent negatives include no chest pain, myalgias or shortness of breath. Risk factors for coronary artery disease include diabetes mellitus, dyslipidemia, hypertension and a sedentary lifestyle.  Hypertension  This is a chronic problem. The current episode started more than 1 year ago. The problem is controlled. Pertinent negatives include no blurred vision, chest pain, headaches, palpitations or shortness of breath. Risk factors for coronary artery disease include dyslipidemia, diabetes mellitus, obesity, sedentary lifestyle and smoking/tobacco exposure. Hypertensive end-organ damage includes retinopathy.     Review of Systems  Constitutional: Negative for chills, fever and unexpected weight change.  HENT: Negative for trouble swallowing and voice change.   Eyes: Negative for blurred vision and visual disturbance.   Respiratory: Negative for cough, shortness of breath and wheezing.   Cardiovascular: Negative for chest pain, palpitations and leg swelling.  Gastrointestinal: Negative for diarrhea, nausea and vomiting.  Endocrine: Negative for cold intolerance, heat intolerance, polydipsia, polyphagia and polyuria.  Musculoskeletal: Negative for arthralgias and myalgias.  Skin: Negative for color change, pallor, rash and wound.  Neurological: Negative for seizures and headaches.  Psychiatric/Behavioral: Negative for confusion and suicidal ideas.    Objective:    BP 120/78   Pulse 73   Ht 5' 3.5" (1.613 m)   Wt 158 lb (71.7 kg)   BMI 27.55 kg/m   Wt Readings from Last 3 Encounters:  05/07/18 158 lb (71.7 kg)  04/16/18 161 lb (73 kg)  01/21/18 159 lb 13.3 oz (72.5 kg)    Physical Exam  Constitutional: She is oriented to person, place, and time. She appears well-developed.  HENT:  Head: Normocephalic and atraumatic.  Eyes: EOM are normal.  Neck: Normal range of motion. Neck supple. No tracheal deviation present. No thyromegaly present.  Cardiovascular: Normal rate.  Pulmonary/Chest: Effort normal.  Abdominal: There is no abdominal tenderness. There is no guarding.  Musculoskeletal: Normal range of motion.        General: No edema.  Neurological: She is alert and oriented to person, place, and time. No cranial nerve deficit. Coordination normal.  Skin: Skin is warm and dry. No rash noted. No erythema. No pallor.  Psychiatric: She has a normal mood and affect. Judgment normal.   Foot exam is normal on May 07, 2018.  Results for orders placed or performed in visit on 11/23/14  Basic metabolic panel  Result Value Ref Range   BUN 10 4 - 21   Creatinine 0.8 0.5 - 1.1  Hemoglobin A1c  Result Value Ref Range   Hemoglobin A1C 7.8    Diabetic Labs (most recent): Lab Results  Component Value Date   HGBA1C 7.8 04/14/2018   HGBA1C 8.1 (H) 12/28/2017   HGBA1C 8.1 (H) 09/23/2017   Lipid  Panel     Component Value Date/Time   CHOL 147 04/20/2017 0943   TRIG 182 (H) 04/20/2017 0943   HDL 41 04/20/2017 0943   LDLCALC 70 04/20/2017 0943     Assessment & Plan:   1. 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 These are all discussed in detail with the patient.  -She returns with blood glucose average of 183 over the last 30 days, 168 over the last 14 days.  Her most recent A1c was improving to 7.8% from 8.1%.  -She has no documented or reported hypoglycemia.    Recent labs reviewed, showing normal renal function.  - I have re-counseled the patient on diet management  by adopting a carbohydrate restricted / protein rich  Diet.  -  Suggestion is made for her to avoid simple carbohydrates  from her diet including Cakes, Sweet Desserts / Pastries, Ice Cream, Soda (diet and regular), Sweet Tea, Candies, Chips, Cookies, Store Bought Juices, Alcohol in Excess of  1-2 drinks a day, Artificial Sweeteners, and "Sugar-free" Products. This will help patient to have stable blood glucose profile and potentially avoid unintended weight gain.  - Patient is advised to stick to a routine mealtimes to eat 3 meals  a day and avoid unnecessary snacks ( to snack only to correct hypoglycemia).  - I have approached patient with the following individualized plan to manage diabetes and patient agrees.  -She continues to tolerate Januvia.  She is advised to continue Januvia 50 mg p.o. every morning with breakfast.    -He is also tolerating extended release metformin.  She is advised to continue metformin 500 mg ER p.o. twice daily after breakfast and supper.  -She is advised to continue  Lantus at 50 units nightly to avoid fasting hypoglycemia, she will continue to monitor blood glucose twice a day- daily before breakfast and at bedtime.   -She does not need prandial insulin for now.  She did not tolerate Victoza on previous attempt.  - Patient specific target  for A1c; LDL, HDL, Triglycerides, and  Waist Circumference were discussed in detail.  2) BP/HTN: Her blood pressure is controlled to target.  [name] 3) Lipids/HPL: Her recent lipid panel showed controlled LDL at 70.  She is not on statins.  4)  Weight/Diet:  exercise, and carbohydrates information provided.  5) Chronic Care/Health Maintenance:  -Patient is  encouraged to continue to follow up with Ophthalmology, Podiatrist at least yearly or according to recommendations, and advised to quit smoking. I have recommended yearly flu vaccine and pneumonia vaccination at least every 5 years; moderate intensity exercise for up to 150 minutes weekly; and  sleep for at least 7 hours a day.  - I advised patient to maintain close follow up with [name], MD for primary care needs.   - Time spent with the patient: 25 min, of which >50% was spent in reviewing her blood glucose logs , discussing her hypo- and hyper-glycemic episodes, reviewing her current and  previous labs and insulin doses and developing a plan to avoid hypo- and hyper-glycemia. Please refer to Patient Instructions for Blood Glucose Monitoring and Insulin/Medications Dosing Guide"  in media tab for additional information. Stephens Shire participated in the discussions, expressed understanding, and voiced agreement with the above plans.  All questions were answered to her satisfaction. she is encouraged to contact clinic should she have any questions or concerns prior to her return visit.  Follow up plan: -Return in about 4 months (around 09/06/2018) for Meter, and Logs, Meter, and Logs, Follow up with Pre-visit Labs, Meter, and Logs.   Glade Lloyd, MD Phone:  [phone]  Fax: [phone]  -  This note was partially dictated with voice recognition software. Similar sounding words can be transcribed inadequately or may not  be corrected upon review.  05/07/2018, 9:48 AM

## 2018-05-28 ENCOUNTER — Other Ambulatory Visit: Payer: Self-pay | Admitting: "Endocrinology

## 2018-05-31 ENCOUNTER — Encounter (INDEPENDENT_AMBULATORY_CARE_PROVIDER_SITE_OTHER): Payer: Medicaid Other | Admitting: Ophthalmology

## 2018-05-31 DIAGNOSIS — I10 Essential (primary) hypertension: Secondary | ICD-10-CM | POA: Diagnosis not present

## 2018-05-31 DIAGNOSIS — H35033 Hypertensive retinopathy, bilateral: Secondary | ICD-10-CM

## 2018-05-31 DIAGNOSIS — E10311 Type 1 diabetes mellitus with unspecified diabetic retinopathy with macular edema: Secondary | ICD-10-CM | POA: Diagnosis not present

## 2018-05-31 DIAGNOSIS — E103513 Type 1 diabetes mellitus with proliferative diabetic retinopathy with macular edema, bilateral: Secondary | ICD-10-CM | POA: Diagnosis not present

## 2018-05-31 DIAGNOSIS — H43813 Vitreous degeneration, bilateral: Secondary | ICD-10-CM

## 2018-06-22 ENCOUNTER — Emergency Department (HOSPITAL_COMMUNITY): Payer: Medicaid Other

## 2018-06-22 ENCOUNTER — Emergency Department (HOSPITAL_COMMUNITY)
Admission: EM | Admit: 2018-06-22 | Discharge: 2018-06-22 | Disposition: A | Discharge status: Home / Self Care | Payer: Medicaid Other | Attending: Emergency Medicine | Admitting: Emergency Medicine

## 2018-06-22 ENCOUNTER — Other Ambulatory Visit: Payer: Self-pay

## 2018-06-22 ENCOUNTER — Encounter (HOSPITAL_COMMUNITY): Payer: Self-pay | Admitting: Emergency Medicine

## 2018-06-22 DIAGNOSIS — Y93I9 Activity, other involving external motion: Secondary | ICD-10-CM | POA: Insufficient documentation

## 2018-06-22 DIAGNOSIS — E119 Type 2 diabetes mellitus without complications: Secondary | ICD-10-CM | POA: Insufficient documentation

## 2018-06-22 DIAGNOSIS — Y998 Other external cause status: Secondary | ICD-10-CM | POA: Diagnosis not present

## 2018-06-22 DIAGNOSIS — Y92410 Unspecified street and highway as the place of occurrence of the external cause: Secondary | ICD-10-CM | POA: Diagnosis not present

## 2018-06-22 DIAGNOSIS — S20212A Contusion of left front wall of thorax, initial encounter: Secondary | ICD-10-CM | POA: Insufficient documentation

## 2018-06-22 DIAGNOSIS — S299XXA Unspecified injury of thorax, initial encounter: Secondary | ICD-10-CM | POA: Diagnosis present

## 2018-06-22 LAB — CBC WITH DIFFERENTIAL/PLATELET
ABS IMMATURE GRANULOCYTES: 0.02 10*3/uL (ref 0.00–0.07)
Basophils Absolute: 0 10*3/uL (ref 0.0–0.1)
Basophils Relative: 0 %
Eosinophils Absolute: 0.2 10*3/uL (ref 0.0–0.5)
Eosinophils Relative: 2 %
HCT: 40.9 % (ref 36.0–46.0)
HEMOGLOBIN: 13.6 g/dL (ref 12.0–15.0)
Immature Granulocytes: 0 %
LYMPHS ABS: 2.2 10*3/uL (ref 0.7–4.0)
Lymphocytes Relative: 23 %
MCH: 29.4 pg (ref 26.0–34.0)
MCHC: 33.3 g/dL (ref 30.0–36.0)
MCV: 88.5 fL (ref 80.0–100.0)
Monocytes Absolute: 0.4 10*3/uL (ref 0.1–1.0)
Monocytes Relative: 4 %
Neutro Abs: 6.7 10*3/uL (ref 1.7–7.7)
Neutrophils Relative %: 71 %
Platelets: 281 10*3/uL (ref 150–400)
RBC: 4.62 MIL/uL (ref 3.87–5.11)
RDW: 11.6 % (ref 11.5–15.5)
WBC: 9.6 10*3/uL (ref 4.0–10.5)
nRBC: 0 % (ref 0.0–0.2)

## 2018-06-22 LAB — BASIC METABOLIC PANEL
Anion gap: 7 (ref 5–15)
BUN: 14 mg/dL (ref 6–20)
CO2: 26 mmol/L (ref 22–32)
Calcium: 9 mg/dL (ref 8.9–10.3)
Chloride: 105 mmol/L (ref 98–111)
Creatinine, Ser: 0.58 mg/dL (ref 0.44–1.00)
GFR calc Af Amer: >60 mL/min (ref >60)
GFR calc non Af Amer: >60 mL/min (ref >60)
Glucose, Bld: 180 mg/dL — ABNORMAL HIGH (ref 70–99)
Potassium: 4.1 mmol/L (ref 3.5–5.1)
Sodium: 138 mmol/L (ref 135–145)

## 2018-06-22 MED ORDER — FENTANYL CITRATE (PF) 100 MCG/2ML IJ SOLN
50.0000 ug | INTRAMUSCULAR | Status: DC | PRN
  Administered 2018-06-22: 50 ug via INTRAVENOUS
  Filled 2018-06-22: qty 2

## 2018-06-22 MED ORDER — IOPAMIDOL (ISOVUE-300) INJECTION 61%
100.0000 mL | Freq: Once | INTRAVENOUS | Status: AC | PRN
  Administered 2018-06-22: 100 mL via INTRAVENOUS

## 2018-06-22 NOTE — Discharge Instructions (Signed)
Use Tylenol and Motrin along with ice as needed for pain control. See clinician if you develop shortness of breath, fevers, productive cough or new concerns.  Use spirometer as directed.  If you were given medicines take as directed.  If you are on coumadin or contraceptives realize their levels and effectiveness is altered by many different medicines.  If you have any reaction (rash, tongues swelling, other) to the medicines stop taking and see a physician.    If your blood pressure was elevated in the ER make sure you follow up for management with a primary doctor or return for chest pain, shortness of breath or stroke symptoms.  Please follow up as directed and return to the ER or see a physician for new or worsening symptoms.  Thank you. Vitals:   06/22/18 1030 06/22/18 1105 06/22/18 1130 06/22/18 1200  BP: (!) 128/94 121/78 121/89 118/68  Pulse: 78 80 85 79  Resp: 18 18 18 18   Temp:      TempSrc:      SpO2: 97% 100% 98% 97%  Weight:      Height:
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
## 2018-06-29 ENCOUNTER — Encounter (INDEPENDENT_AMBULATORY_CARE_PROVIDER_SITE_OTHER): Payer: Medicaid Other | Admitting: Ophthalmology

## 2018-06-29 DIAGNOSIS — H35033 Hypertensive retinopathy, bilateral: Secondary | ICD-10-CM | POA: Diagnosis not present

## 2018-06-29 DIAGNOSIS — E10311 Type 1 diabetes mellitus with unspecified diabetic retinopathy with macular edema: Secondary | ICD-10-CM

## 2018-06-29 DIAGNOSIS — E103513 Type 1 diabetes mellitus with proliferative diabetic retinopathy with macular edema, bilateral: Secondary | ICD-10-CM

## 2018-06-29 DIAGNOSIS — I10 Essential (primary) hypertension: Secondary | ICD-10-CM | POA: Diagnosis not present

## 2018-06-29 DIAGNOSIS — H43813 Vitreous degeneration, bilateral: Secondary | ICD-10-CM

## 2018-07-26 ENCOUNTER — Encounter (INDEPENDENT_AMBULATORY_CARE_PROVIDER_SITE_OTHER): Payer: Medicaid Other | Admitting: Ophthalmology

## 2018-08-27 ENCOUNTER — Other Ambulatory Visit: Payer: Self-pay | Admitting: "Endocrinology

## 2018-09-09 ENCOUNTER — Ambulatory Visit: Payer: Medicaid Other | Admitting: "Endocrinology

## 2018-09-13 ENCOUNTER — Other Ambulatory Visit: Payer: Self-pay | Admitting: "Endocrinology

## 2018-09-14 LAB — COMPREHENSIVE METABOLIC PANEL
ALT: 23 IU/L (ref 0–32)
AST: 15 IU/L (ref 0–40)
Albumin/Globulin Ratio: 1.7 (ref 1.2–2.2)
Albumin: 4.5 g/dL (ref 3.8–4.8)
Alkaline Phosphatase: 112 IU/L (ref 39–117)
BUN/Creatinine Ratio: 29 — ABNORMAL HIGH (ref 9–23)
BUN: 20 mg/dL (ref 6–24)
Bilirubin Total: 0.2 mg/dL (ref 0.0–1.2)
CO2: 23 mmol/L (ref 20–29)
Calcium: 9.7 mg/dL (ref 8.7–10.2)
Chloride: 96 mmol/L (ref 96–106)
Creatinine, Ser: 0.68 mg/dL (ref 0.57–1.00)
GFR calc Af Amer: 121 mL/min/{1.73_m2} (ref >59)
GFR calc non Af Amer: 105 mL/min/{1.73_m2} (ref >59)
Globulin, Total: 2.6 g/dL (ref 1.5–4.5)
Glucose: 293 mg/dL — ABNORMAL HIGH (ref 65–99)
Potassium: 5 mmol/L (ref 3.5–5.2)
Sodium: 137 mmol/L (ref 134–144)
Total Protein: 7.1 g/dL (ref 6.0–8.5)

## 2018-09-14 LAB — HGB A1C W/O EAG: Hgb A1c MFr Bld: 8.7 % — ABNORMAL HIGH (ref 4.8–5.6)

## 2018-09-17 ENCOUNTER — Other Ambulatory Visit: Payer: Self-pay

## 2018-09-17 ENCOUNTER — Ambulatory Visit (INDEPENDENT_AMBULATORY_CARE_PROVIDER_SITE_OTHER): Payer: Medicaid Other | Admitting: "Endocrinology

## 2018-09-17 ENCOUNTER — Encounter: Payer: Self-pay | Admitting: "Endocrinology

## 2018-09-17 DIAGNOSIS — E782 Mixed hyperlipidemia: Secondary | ICD-10-CM

## 2018-09-17 DIAGNOSIS — I10 Essential (primary) hypertension: Secondary | ICD-10-CM | POA: Diagnosis not present

## 2018-09-17 DIAGNOSIS — Z794 Long term (current) use of insulin: Secondary | ICD-10-CM

## 2018-09-17 DIAGNOSIS — E11311 Type 2 diabetes mellitus with unspecified diabetic retinopathy with macular edema: Secondary | ICD-10-CM | POA: Diagnosis not present

## 2018-09-17 NOTE — Progress Notes (Signed)
Endocrinology Telehealth Visit Follow up Note -During COVID -19 Pandemic  This visit type was conducted due to national recommendations for restrictions regarding the COVID-19 Pandemic  in an effort to limit this patient's exposure and mitigate transmission of the corona virus.  Due to his co-morbid illnesses, this patient is at  moderate to high risk for complications without adequate follow up.  This format is felt to be most appropriate for this patient at this time.  All issues noted in this document were discussed and addressed. The format was not optimal for physical exam.  Patient has verbally consented to this visit, and please refer to the patient's chart for details of arrangement for this telehealth for [facility] .    Subjective:    Patient ID: Allison [name], female    DOB: 1971-05-26, PCP [name], MD   Past Medical History:  Diagnosis Date  . Brain cyst    x2  . Cancer (HCC)    cervical   . Dyslipidemia   . Gastroparesis    possible  . Glaucoma   . Hypertension   . Insulin dependent diabetes mellitus ([place])   . Neuropathy   . Retinopathy   . 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 ABDOMINAL HYSTERECTOMY     partial   . EYE SURGERY     multiple  . RADIOLOGY WITH ANESTHESIA N/A 07/07/2017   Procedure: MRI WITH ANESTHESIA,BRAIN WITHOUT CONTRAST;  Surgeon: Radiologist, Medication, MD;  Location: [place];  Service: Radiology;  Laterality: N/A;   Social History   Socioeconomic History  . Marital status: Divorced    Spouse name: Not on file  . Number of children: Not on file  . Years of education: Not on file  . Highest education level: Not on file  Occupational History  . Not on file  Social Needs  . Financial resource strain: Not on file  . Food insecurity:    Worry: Not on file    Inability: Not on file  . Transportation needs:    Medical: Not on  file    Non-medical: Not on file  Tobacco Use  . Smoking status: Current Some Day Smoker    Packs/day: 0.25    Years: 26.00    Pack years: 6.50    Types: Cigarettes    Last attempt to quit: 05/05/2014    Years since quitting: 4.3  . Smokeless tobacco: Never Used  Substance and Sexual Activity  . Alcohol use: No  . Drug use: No  . Sexual activity: Yes    Birth control/protection: Surgical  Lifestyle  . Physical activity:    Days per week: Not on file    Minutes per session: Not on file  . Stress: Not on file  Relationships  . Social connections:    Talks on phone: Not on file    Gets together: Not on file    Attends religious service: Not on file    Active member of club or organization: Not on file    Attends meetings of clubs or organizations: Not on file    Relationship status: Not on file  Other Topics  Concern  . Not on file  Social History Narrative  . Not on file   Outpatient Encounter Medications as of 09/17/2018  Medication Sig  . ACCU-CHEK AVIVA PLUS test strip USE AS DIRECTED UP TO FOUR TIMES DAILY. (Patient not taking: Reported on 06/22/2018)  . albuterol (PROAIR HFA) 108 (90 BASE) MCG/ACT inhaler Inhale 2 puffs into the lungs every 4 (four) hours as needed for wheezing or shortness of breath.  . ALPHAGAN P 0.1 % SOLN Place 1 drop into both eyes 2 (two) times daily.  [name] ALPRAZolam (XANAX) 0.5 MG tablet Take 0.5 mg by mouth 4 (four) times daily as needed for anxiety.   [name] amphetamine-dextroamphetamine (ADDERALL) 10 MG tablet Take 20 mg by mouth 2 (two) times daily with a meal.   . aspirin EC 81 MG tablet Take 81 mg by mouth daily.  . blood glucose meter kit and supplies KIT Dispense based on patient and insurance preference. Use up to four times daily as directed. (FOR ICD 10 E11.65). (Patient not taking: Reported on 06/22/2018)  . conjugated estrogens (PREMARIN) vaginal cream Place 4.97 Applicatorfuls vaginally 3 (three) times a week. (Patient not taking: Reported on  06/22/2018)  . furosemide (LASIX) 20 MG tablet Take 20 mg by mouth daily.   [name] HYDROcodone-acetaminophen (NORCO) 7.5-325 MG tablet Take 1 tablet by mouth every 4 (four) hours as needed for severe pain.  . Insulin Glargine (LANTUS SOLOSTAR) 100 UNIT/ML Solostar Pen Inject 50 Units into the skin at bedtime. (Patient taking differently: Inject 54 Units into the skin at bedtime. )  . Insulin Pen Needle (B-D ULTRAFINE III SHORT PEN) 31G X 8 MM MISC 1 each by Does not apply route as directed. (Patient not taking: Reported on 06/22/2018)  . JANUVIA 50 MG tablet TAKE ONE TABLET BY MOUTH ONCE DAILY.  [name] loratadine (CLARITIN) 10 MG tablet Take 10 mg by mouth daily.  . Melatonin 5 MG TABS Take 1 tablet by mouth at bedtime.  . metoCLOPramide (REGLAN) 10 MG tablet Take 1 tablet (10 mg total) by mouth every 6 (six) hours as needed for nausea or vomiting. (Patient not taking: Reported on 06/22/2018)  . moxifloxacin (VIGAMOX) 0.5 % ophthalmic solution Place 1 drop into both eyes See admin instructions. Uses when having eye injections (every 6 weeks)  . Multiple Vitamin (MULTIVITAMIN) tablet Take 2 tablets by mouth daily.   [name] omeprazole (PRILOSEC) 20 MG capsule Take 20 mg by mouth 2 (two) times daily as needed.   . ondansetron (ZOFRAN ODT) 4 MG disintegrating tablet Take 1 tablet (4 mg total) by mouth every 8 (eight) hours as needed for nausea or vomiting. (Patient not taking: Reported on 06/22/2018)  . phenylephrine (SUDAFED PE) 10 MG TABS tablet Take 10 mg by mouth every 4 (four) hours as needed.  . polyethylene glycol (MIRALAX / GLYCOLAX) packet Take 17 g by mouth daily.  . potassium chloride SA (K-DUR,KLOR-CON) 20 MEQ tablet Take 20 mEq by mouth 2 (two) times a week.   . [DISCONTINUED] metFORMIN (GLUCOPHAGE XR) 500 MG 24 hr tablet Take 1 tablet (500 mg total) by mouth daily with breakfast. (Patient not taking: Reported on 06/22/2018)   Facility-Administered Encounter Medications as of 09/17/2018  Medication  .  triamcinolone acetonide (TRIESENCE) 40 MG/ML subtenons injection 4 mg  . triamcinolone acetonide (TRIESENCE) 40 MG/ML subtenons injection 4 mg   ALLERGIES: Allergies  Allergen Reactions  . Cymbalta [Duloxetine Hcl] Shortness Of Breath  . Beta Adrenergic Blockers Palpitations  . Codeine Itching, Dermatitis and  Other (See Comments)    TYLENOL #3   Welts and itching  . Lexapro [Escitalopram Oxalate] Hives and Palpitations  . Other Palpitations    Allergic to all beta blocker eye drops!!  . Victoza [Liraglutide] Nausea And Vomiting  . Doxycycline Other (See Comments)    UNSPECIFIED Non-specific Heart problems  . Oxycodone Itching   VACCINATION STATUS:  There is no immunization history on file for this patient.  Diabetes  She presents for her follow-up diabetic visit. She has type 2 diabetes mellitus. Onset time: She was diagnosed at approximate age of 52 years. Her disease course has been worsening. There are no hypoglycemic associated symptoms. Pertinent negatives for hypoglycemia include no confusion, headaches, pallor or seizures. Pertinent negatives for diabetes include no blurred vision, no chest pain, no polydipsia, no polyphagia and no polyuria. There are no hypoglycemic complications. Symptoms are worsening. Diabetic complications include retinopathy. Risk factors for coronary artery disease include diabetes mellitus, dyslipidemia, hypertension, tobacco exposure and sedentary lifestyle. Current diabetic treatment includes insulin injections and oral agent (monotherapy). She is compliant with treatment most of the time. Her weight is fluctuating minimally. She is following a generally unhealthy diet. When asked about meal planning, she reported none. She never participates in exercise. There is no change in her home blood glucose trend. Her breakfast blood glucose range is generally 140-180 mg/dl. Her overall blood glucose range is 140-180 mg/dl. (She returns with 7-day average of 160,  14-day average of 168.) Eye exam is current.  Hyperlipidemia  This is a chronic problem. The current episode started more than 1 year ago. The problem is controlled. Exacerbating diseases include diabetes. Pertinent negatives include no chest pain, myalgias or shortness of breath. Risk factors for coronary artery disease include diabetes mellitus, dyslipidemia, hypertension and a sedentary lifestyle.  Hypertension  This is a chronic problem. The current episode started more than 1 year ago. The problem is controlled. Pertinent negatives include no blurred vision, chest pain, headaches, palpitations or shortness of breath. Risk factors for coronary artery disease include dyslipidemia, diabetes mellitus, obesity, sedentary lifestyle and smoking/tobacco exposure. Hypertensive end-organ damage includes retinopathy.   Review of systems: Limited as above.   Objective:    There were no vitals taken for this visit.  Wt Readings from Last 3 Encounters:  06/22/18 154 lb (69.9 kg)  05/07/18 158 lb (71.7 kg)  04/16/18 161 lb (73 kg)    Foot exam is normal on May 07, 2018.  Results for orders placed or performed in visit on 09/13/18  Comprehensive metabolic panel  Result Value Ref Range   Glucose 293 (H) 65 - 99 mg/dL   BUN 20 6 - 24 mg/dL   Creatinine, Ser 0.68 0.57 - 1.00 mg/dL   GFR calc non Af Amer 105 >59 mL/min/1.73   GFR calc Af Amer 121 >59 mL/min/1.73   BUN/Creatinine Ratio 29 (H) 9 - 23   Sodium 137 134 - 144 mmol/L   Potassium 5.0 3.5 - 5.2 mmol/L   Chloride 96 96 - 106 mmol/L   CO2 23 20 - 29 mmol/L   Calcium 9.7 8.7 - 10.2 mg/dL   Total Protein 7.1 6.0 - 8.5 g/dL   Albumin 4.5 3.8 - 4.8 g/dL   Globulin, Total 2.6 1.5 - 4.5 g/dL   Albumin/Globulin Ratio 1.7 1.2 - 2.2   Bilirubin Total 0.2 0.0 - 1.2 mg/dL   Alkaline Phosphatase 112 39 - 117 IU/L   AST 15 0 - 40 IU/L   ALT 23  0 - 32 IU/L  Hgb A1c w/o eAG  Result Value Ref Range   Hgb A1c MFr Bld 8.7 (H) 4.8 - 5.6 %    Diabetic Labs (most recent): Lab Results  Component Value Date   HGBA1C 8.7 (H) 09/13/2018   HGBA1C 7.8 04/14/2018   HGBA1C 8.1 (H) 12/28/2017   Lipid Panel     Component Value Date/Time   CHOL 147 04/20/2017 0943   TRIG 182 (H) 04/20/2017 0943   HDL 41 04/20/2017 0943   LDLCALC 70 04/20/2017 0943     Assessment & Plan:   1. 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 These are all discussed in detail with the patient.  -Her previsit labs show A1c of 8.7% increasing from 7.8%.  She reports glycemic profile between 140-180 mg per DL at fasting.    -She has no documented or reported hypoglycemia.    Recent labs reviewed, showing normal renal function.  - I have re-counseled the patient on diet management  by adopting a carbohydrate restricted / protein rich  Diet.  - Patient admits there is a room for improvement in her diet and drink choices. -  Suggestion is made for her to avoid simple carbohydrates  from her diet including Cakes, Sweet Desserts / Pastries, Ice Cream, Soda (diet and regular), Sweet Tea, Candies, Chips, Cookies, Store Bought Juices, Alcohol in Excess of  1-2 drinks a day, Artificial Sweeteners, and "Sugar-free" Products. This will help patient to have stable blood glucose profile and potentially avoid unintended weight gain.   - Patient is advised to stick to a routine mealtimes to eat 3 meals  a day and avoid unnecessary snacks ( to snack only to correct hypoglycemia).  - I have approached patient with the following individualized plan to manage diabetes and patient agrees.  -She continues to tolerate Januvia.  She is advised to continue  Januvia 50 mg p.o. every morning with breakfast.    -He even the extended release metformin.  She is advised to discontinue at this time.  She is advised to continue Lantus at the higher dose of 54 units nightly, associated with strict monitoring of blood glucose 2 times a day-daily before breakfast and at bedtime.  -She is encouraged to call clinic for fasting blood glucose readings above 200 mg per DL. -She did not tolerate Victoza on previous attempt.   2) BP/HTN: she is advised to home monitor blood pressure and report if > 140/90 on 2 separate readings.   3) Lipids/HPL: Her recent lipid panel showed controlled LDL at 70.  She is not on statins.  4) Chronic Care/Health Maintenance:  -Patient is  encouraged to continue to follow up with Ophthalmology, Podiatrist at least yearly or according to recommendations, and advised to quit smoking. I have recommended yearly flu vaccine and pneumonia vaccination at least every 5 years; moderate intensity exercise for up to 150 minutes weekly; and  sleep for at least 7 hours a day.  - I advised patient to maintain close follow up with [name], MD for primary care needs.   - Patient Care Time Today:  25 min, of which >50% was spent in reviewing her  current and  previous labs/studies, her blood  glucose readings, previous treatments, and medications doses and developing a plan for long-term care based on the latest recommendations for standards of care.  Stephens Shire participated in the discussions, expressed understanding, and voiced agreement with the above plans.  All questions were answered to her satisfaction. she is encouraged to contact clinic should she have any questions or concerns prior to her return visit.   Follow up plan: -Return in about 4 months (around 01/17/2019) for Follow up with Pre-visit Labs, Meter, and Logs.   Glade Lloyd, MD Phone: [phone]  Fax: [phone]  -  This note was partially dictated with voice  recognition software. Similar sounding words can be transcribed inadequately or may not  be corrected upon review.  09/17/2018, 12:11 PM

## 2018-09-20 ENCOUNTER — Other Ambulatory Visit: Payer: Self-pay

## 2018-09-20 ENCOUNTER — Encounter (INDEPENDENT_AMBULATORY_CARE_PROVIDER_SITE_OTHER): Payer: Medicaid Other | Admitting: Ophthalmology

## 2018-09-20 DIAGNOSIS — I10 Essential (primary) hypertension: Secondary | ICD-10-CM | POA: Diagnosis not present

## 2018-09-20 DIAGNOSIS — E103513 Type 1 diabetes mellitus with proliferative diabetic retinopathy with macular edema, bilateral: Secondary | ICD-10-CM

## 2018-09-20 DIAGNOSIS — H35033 Hypertensive retinopathy, bilateral: Secondary | ICD-10-CM

## 2018-09-20 DIAGNOSIS — H43813 Vitreous degeneration, bilateral: Secondary | ICD-10-CM

## 2018-09-20 DIAGNOSIS — E10311 Type 1 diabetes mellitus with unspecified diabetic retinopathy with macular edema: Secondary | ICD-10-CM | POA: Diagnosis not present

## 2018-09-27 ENCOUNTER — Other Ambulatory Visit: Payer: Self-pay | Admitting: "Endocrinology

## 2018-11-01 ENCOUNTER — Encounter (INDEPENDENT_AMBULATORY_CARE_PROVIDER_SITE_OTHER): Payer: Medicaid Other | Admitting: Ophthalmology

## 2018-11-29 ENCOUNTER — Other Ambulatory Visit: Payer: Self-pay | Admitting: "Endocrinology

## 2018-12-28 ENCOUNTER — Telehealth: Payer: Self-pay

## 2018-12-28 ENCOUNTER — Other Ambulatory Visit: Payer: Self-pay | Admitting: "Endocrinology

## 2018-12-28 DIAGNOSIS — I10 Essential (primary) hypertension: Secondary | ICD-10-CM

## 2018-12-28 DIAGNOSIS — E11311 Type 2 diabetes mellitus with unspecified diabetic retinopathy with macular edema: Secondary | ICD-10-CM

## 2018-12-28 DIAGNOSIS — E782 Mixed hyperlipidemia: Secondary | ICD-10-CM

## 2018-12-28 DIAGNOSIS — E1142 Type 2 diabetes mellitus with diabetic polyneuropathy: Secondary | ICD-10-CM

## 2018-12-28 MED ORDER — SITAGLIPTIN PHOSPHATE 50 MG PO TABS: 50.0000 mg | ORAL_TABLET | Freq: Every day | ORAL | 0 refills | Status: DC

## 2018-12-28 NOTE — Telephone Encounter (Signed)
Allison [name], CMA  

## 2019-01-18 ENCOUNTER — Ambulatory Visit: Payer: Medicaid Other | Admitting: "Endocrinology

## 2019-01-19 LAB — COMPREHENSIVE METABOLIC PANEL
ALT: 13 IU/L (ref 0–32)
AST: 9 IU/L (ref 0–40)
Albumin/Globulin Ratio: 1.8 (ref 1.2–2.2)
Albumin: 4.3 g/dL (ref 3.8–4.8)
Alkaline Phosphatase: 89 IU/L (ref 39–117)
BUN/Creatinine Ratio: 18 (ref 9–23)
BUN: 11 mg/dL (ref 6–24)
Bilirubin Total: <0.2 mg/dL (ref 0.0–1.2)
CO2: 25 mmol/L (ref 20–29)
Calcium: 9.4 mg/dL (ref 8.7–10.2)
Chloride: 102 mmol/L (ref 96–106)
Creatinine, Ser: 0.6 mg/dL (ref 0.57–1.00)
GFR calc Af Amer: 126 mL/min/{1.73_m2} (ref >59)
GFR calc non Af Amer: 110 mL/min/{1.73_m2} (ref >59)
Globulin, Total: 2.4 g/dL (ref 1.5–4.5)
Glucose: 144 mg/dL — ABNORMAL HIGH (ref 65–99)
Potassium: 4.3 mmol/L (ref 3.5–5.2)
Sodium: 139 mmol/L (ref 134–144)
Total Protein: 6.7 g/dL (ref 6.0–8.5)

## 2019-01-19 LAB — HEMOGLOBIN A1C
Est. average glucose Bld gHb Est-mCnc: 206 mg/dL
Hgb A1c MFr Bld: 8.8 % — ABNORMAL HIGH (ref 4.8–5.6)

## 2019-01-27 ENCOUNTER — Other Ambulatory Visit: Payer: Self-pay

## 2019-01-27 ENCOUNTER — Ambulatory Visit (INDEPENDENT_AMBULATORY_CARE_PROVIDER_SITE_OTHER): Payer: Medicaid Other | Admitting: "Endocrinology

## 2019-01-27 ENCOUNTER — Encounter: Payer: Self-pay | Admitting: "Endocrinology

## 2019-01-27 DIAGNOSIS — E782 Mixed hyperlipidemia: Secondary | ICD-10-CM

## 2019-01-27 DIAGNOSIS — Z794 Long term (current) use of insulin: Secondary | ICD-10-CM

## 2019-01-27 DIAGNOSIS — E11311 Type 2 diabetes mellitus with unspecified diabetic retinopathy with macular edema: Secondary | ICD-10-CM | POA: Diagnosis not present

## 2019-01-27 DIAGNOSIS — I10 Essential (primary) hypertension: Secondary | ICD-10-CM

## 2019-01-27 MED ORDER — SITAGLIPTIN PHOSPHATE 100 MG PO TABS: 50.0000 mg | ORAL_TABLET | Freq: Every day | ORAL | 3 refills | Status: DC

## 2019-01-27 MED ORDER — LANTUS SOLOSTAR 100 UNIT/ML ~~LOC~~ SOPN: PEN_INJECTOR | SUBCUTANEOUS | 3 refills | Status: DC

## 2019-01-27 NOTE — Progress Notes (Signed)
01/27/2019                                                      Endocrinology Telehealth Visit Follow up Note -During COVID -19 Pandemic  This visit type was conducted due to national recommendations for restrictions regarding the COVID-19 Pandemic  in an effort to limit this patient's exposure and mitigate transmission of the corona virus.  Due to her co-morbid illnesses, Allison [name] is at  moderate to high risk for complications without adequate follow up.  This format is felt to be most appropriate for her at this time.  I connected with this patient on 01/27/2019   by telephone and verified that I am speaking with the correct person using two identifiers. Allison [name], [id number]. she has verbally consented to this visit. All issues noted in this document were discussed and addressed. The format was not optimal for physical exam.    Subjective:    Patient ID: Allison [name], female    DOB: September 08, 1970, PCP [name], MD   Past Medical History:  Diagnosis Date  . Brain cyst    x2  . Cancer (HCC)    cervical   . Dyslipidemia   . Gastroparesis    possible  . Glaucoma   . Hypertension   . Insulin dependent diabetes mellitus ([place])   . Neuropathy   . Retinopathy   . 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 ABDOMINAL HYSTERECTOMY     partial   . EYE SURGERY     multiple  . RADIOLOGY WITH ANESTHESIA N/A 07/07/2017   Procedure: MRI WITH ANESTHESIA,BRAIN WITHOUT CONTRAST;  Surgeon: Radiologist, Medication, MD;  Location: [facility];  Service: Radiology;  Laterality: N/A;   Social History   Socioeconomic History  . Marital status: Divorced    Spouse name: Not on file  . Number of children: Not on file  . Years of education: Not on file  . Highest education level: Not on file  Occupational History  . Not on file  Social Needs  . Financial resource strain: Not on file  . Food insecurity    Worry: Not on file     Inability: Not on file  . Transportation needs    Medical: Not on file    Non-medical: Not on file  Tobacco Use  . Smoking status: Current Some Day Smoker    Packs/day: 0.25    Years: 26.00    Pack years: 6.50    Types: Cigarettes    Last attempt to quit: 05/05/2014    Years since quitting: 4.7  . Smokeless tobacco: Never Used  Substance and Sexual Activity  . Alcohol use: No  . Drug use: No  . Sexual activity: Yes    Birth control/protection: Surgical  Lifestyle  . Physical activity    Days per week: Not on file    Minutes per session: Not on file  . Stress: Not on file  Relationships  . Social Herbalist on phone: Not on file    Gets together: Not on file    Attends religious service: Not on file    Active member of club or organization: Not on file    Attends meetings of clubs or organizations: Not on file  Relationship status: Not on file  Other Topics Concern  . Not on file  Social History Narrative  . Not on file   Outpatient Encounter Medications as of 01/27/2019  Medication Sig  . ACCU-CHEK AVIVA PLUS test strip USE AS DIRECTED UP TO FOUR TIMES DAILY.  [name] albuterol (PROAIR HFA) 108 (90 BASE) MCG/ACT inhaler Inhale 2 puffs into the lungs every 4 (four) hours as needed for wheezing or shortness of breath.  . ALPHAGAN P 0.1 % SOLN Place 1 drop into both eyes 2 (two) times daily.  [name] ALPRAZolam (XANAX) 0.5 MG tablet Take 0.5 mg by mouth 4 (four) times daily as needed for anxiety.   [name] amphetamine-dextroamphetamine (ADDERALL) 10 MG tablet Take 20 mg by mouth 2 (two) times daily with a meal.   . aspirin EC 81 MG tablet Take 81 mg by mouth daily.  . blood glucose meter kit and supplies KIT Dispense based on patient and insurance preference. Use up to four times daily as directed. (FOR ICD 10 E11.65). (Patient not taking: Reported on 06/22/2018)  . conjugated estrogens (PREMARIN) vaginal cream Place 1.74 Applicatorfuls vaginally 3 (three) times a week. (Patient not  taking: Reported on 06/22/2018)  . furosemide (LASIX) 20 MG tablet Take 20 mg by mouth daily.   [name] HYDROcodone-acetaminophen (NORCO) 7.5-325 MG tablet Take 1 tablet by mouth every 4 (four) hours as needed for severe pain.  . Insulin Glargine (LANTUS SOLOSTAR) 100 UNIT/ML Solostar Pen 60 units qhs  . Insulin Pen Needle (B-D ULTRAFINE III SHORT PEN) 31G X 8 MM MISC 1 each by Does not apply route as directed. (Patient not taking: Reported on 06/22/2018)  . loratadine (CLARITIN) 10 MG tablet Take 10 mg by mouth daily.  . Melatonin 5 MG TABS Take 1 tablet by mouth at bedtime.  . metoCLOPramide (REGLAN) 10 MG tablet Take 1 tablet (10 mg total) by mouth every 6 (six) hours as needed for nausea or vomiting. (Patient not taking: Reported on 06/22/2018)  . moxifloxacin (VIGAMOX) 0.5 % ophthalmic solution Place 1 drop into both eyes See admin instructions. Uses when having eye injections (every 6 weeks)  . Multiple Vitamin (MULTIVITAMIN) tablet Take 2 tablets by mouth daily.   [name] omeprazole (PRILOSEC) 20 MG capsule Take 20 mg by mouth 2 (two) times daily as needed.   . ondansetron (ZOFRAN ODT) 4 MG disintegrating tablet Take 1 tablet (4 mg total) by mouth every 8 (eight) hours as needed for nausea or vomiting. (Patient not taking: Reported on 06/22/2018)  . phenylephrine (SUDAFED PE) 10 MG TABS tablet Take 10 mg by mouth every 4 (four) hours as needed.  . polyethylene glycol (MIRALAX / GLYCOLAX) packet Take 17 g by mouth daily.  . potassium chloride SA (K-DUR,KLOR-CON) 20 MEQ tablet Take 20 mEq by mouth 2 (two) times a week.   . sitaGLIPtin (JANUVIA) 100 MG tablet Take 0.5 tablets (50 mg total) by mouth daily.  . sitaGLIPtin (JANUVIA) 50 MG tablet Take 1 tablet (50 mg total) by mouth daily.  . [DISCONTINUED] Insulin Glargine (LANTUS SOLOSTAR) 100 UNIT/ML Solostar Pen 54 units qhs  . [DISCONTINUED] JANUVIA 50 MG tablet TAKE ONE TABLET BY MOUTH ONCE DAILY.   Facility-Administered Encounter Medications as of  01/27/2019  Medication  . triamcinolone acetonide (TRIESENCE) 40 MG/ML subtenons injection 4 mg  . triamcinolone acetonide (TRIESENCE) 40 MG/ML subtenons injection 4 mg   ALLERGIES: Allergies  Allergen Reactions  . Cymbalta [Duloxetine Hcl] Shortness Of Breath  . Beta Adrenergic Blockers Palpitations  .  Codeine Itching, Dermatitis and Other (See Comments)    TYLENOL #3   Welts and itching  . Lexapro [Escitalopram Oxalate] Hives and Palpitations  . Other Palpitations    Allergic to all beta blocker eye drops!!  . Victoza [Liraglutide] Nausea And Vomiting  . Doxycycline Other (See Comments)    UNSPECIFIED Non-specific Heart problems  . Oxycodone Itching   VACCINATION STATUS:  There is no immunization history on file for this patient.  Diabetes She presents for her follow-up diabetic visit. She has type 2 diabetes mellitus. Onset time: She was diagnosed at approximate age of 37 years. Her disease course has been worsening. There are no hypoglycemic associated symptoms. Pertinent negatives for hypoglycemia include no confusion, headaches, pallor or seizures. Pertinent negatives for diabetes include no blurred vision, no chest pain, no polydipsia, no polyphagia and no polyuria. There are no hypoglycemic complications. Symptoms are worsening. Diabetic complications include retinopathy. Risk factors for coronary artery disease include diabetes mellitus, dyslipidemia, hypertension, tobacco exposure and sedentary lifestyle. Current diabetic treatment includes insulin injections and oral agent (monotherapy). She is compliant with treatment most of the time. Her weight is fluctuating minimally. She is following a generally unhealthy diet. When asked about meal planning, she reported none. She never participates in exercise. There is no change in her home blood glucose trend. Her breakfast blood glucose range is generally 140-180 mg/dl. Her overall blood glucose range is 140-180 mg/dl. (She reports that  her fasting blood glucose profile range from 120-220 and a previsit labs show A1c of 8.8% increasing from 7.8%. ) Eye exam is current.  Hyperlipidemia This is a chronic problem. The current episode started more than 1 year ago. The problem is controlled. Exacerbating diseases include diabetes. Pertinent negatives include no chest pain, myalgias or shortness of breath. Risk factors for coronary artery disease include diabetes mellitus, dyslipidemia, hypertension and a sedentary lifestyle.  Hypertension This is a chronic problem. The current episode started more than 1 year ago. The problem is controlled. Pertinent negatives include no blurred vision, chest pain, headaches, palpitations or shortness of breath. Risk factors for coronary artery disease include dyslipidemia, diabetes mellitus, obesity, sedentary lifestyle and smoking/tobacco exposure. Hypertensive end-organ damage includes retinopathy.   Review of systems: Limited as above.   Objective:    There were no vitals taken for this visit.  Wt Readings from Last 3 Encounters:  06/22/18 154 lb (69.9 kg)  05/07/18 158 lb (71.7 kg)  04/16/18 161 lb (73 kg)    Foot exam is normal on May 07, 2018.  Results for orders placed or performed in visit on 12/28/18  Comprehensive metabolic panel  Result Value Ref Range   Glucose 144 (H) 65 - 99 mg/dL   BUN 11 6 - 24 mg/dL   Creatinine, Ser 0.60 0.57 - 1.00 mg/dL   GFR calc non Af Amer 110 >59 mL/min/1.73   GFR calc Af Amer 126 >59 mL/min/1.73   BUN/Creatinine Ratio 18 9 - 23   Sodium 139 134 - 144 mmol/L   Potassium 4.3 3.5 - 5.2 mmol/L   Chloride 102 96 - 106 mmol/L   CO2 25 20 - 29 mmol/L   Calcium 9.4 8.7 - 10.2 mg/dL   Total Protein 6.7 6.0 - 8.5 g/dL   Albumin 4.3 3.8 - 4.8 g/dL   Globulin, Total 2.4 1.5 - 4.5 g/dL   Albumin/Globulin Ratio 1.8 1.2 - 2.2   Bilirubin Total <0.2 0.0 - 1.2 mg/dL   Alkaline Phosphatase 89 39 - 117 IU/L  AST 9 0 - 40 IU/L   ALT 13 0 - 32 IU/L   Hemoglobin A1c  Result Value Ref Range   Hgb A1c MFr Bld 8.8 (H) 4.8 - 5.6 %   Est. average glucose Bld gHb Est-mCnc 206 mg/dL   Diabetic Labs (most recent): Lab Results  Component Value Date   HGBA1C 8.8 (H) 01/18/2019   HGBA1C 8.7 (H) 09/13/2018   HGBA1C 7.8 04/14/2018   Lipid Panel     Component Value Date/Time   CHOL 147 04/20/2017 0943   TRIG 182 (H) 04/20/2017 0943   HDL 41 04/20/2017 0943   LDLCALC 70 04/20/2017 0943     Assessment & Plan:   1. 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 These are all discussed in detail with the patient.  -Her previsit labs show A1c of 8.8% increasing from 7.8%, and reports uncontrolled fasting blood glucose profile.  She did have 3 steroid injections since her last visit.  -She has no documented or reported hypoglycemia.    Recent labs reviewed, showing normal renal function.  - I have re-counseled the patient on diet management  by adopting a carbohydrate restricted / protein rich  Diet.  - she  admits there is a room for improvement in her diet and drink choices. -  Suggestion is made for her to avoid simple carbohydrates  from her diet including Cakes, Sweet Desserts / Pastries, Ice Cream, Soda (diet and regular), Sweet Tea, Candies, Chips, Cookies, Sweet Pastries,  Store Bought Juices, Alcohol in Excess of  1-2 drinks a day, Artificial Sweeteners, Coffee Creamer, and "Sugar-free" Products. This will help patient to have stable blood glucose profile and potentially avoid unintended weight gain.   - Patient is advised to stick to a routine mealtimes to eat 3 meals  a day and avoid unnecessary snacks ( to snack only to  correct hypoglycemia).  - I have approached patient with the following individualized plan to manage diabetes and patient agrees.  -She continues to tolerate Januvia.  I discussed and increased her Januvia to 100 mg p.o. daily at breakfast.   -She did not tolerate metformin. -She is advised to increase her Lantus to 60 units nightly,   associated with strict monitoring of blood glucose 2 times a day-daily before breakfast and at bedtime.  -She is encouraged to call clinic for fasting blood glucose readings above 200 mg per DL. -She did not tolerate Victoza on previous attempt.   2) BP/HTN: she is advised to home monitor blood pressure and report if > 140/90 on 2 separate readings.   3) Lipids/HPL: Her recent lipid panel showed controlled LDL at 70.  She is not on statins.  4) Chronic Care/Health Maintenance:  -Patient is  encouraged to continue to follow up with Ophthalmology, Podiatrist at least yearly or according to recommendations, and advised to quit smoking. I have recommended yearly flu vaccine and pneumonia vaccination at least every 5 years; moderate intensity exercise for up to 150 minutes weekly; and  sleep for at least 7 hours a day.  - I advised patient to maintain close follow up with [name], MD for primary care needs.  - Patient Care Time Today:  25 min, of which >  50% was spent in  counseling and the rest reviewing her  current and  previous labs/studies, previous treatments, her blood glucose readings, and medications' doses and developing a plan for long-term care based on the latest recommendations for standards of care.   Allison [name] participated in the discussions, expressed understanding, and voiced agreement with the above plans.  All questions were answered to her satisfaction. she is encouraged to contact clinic should she have any questions or concerns prior to her return visit.  Follow up plan: -Return in about 3 months (around 04/28/2019) for Bring Meter  and Logs- A1c in Office, Include 6 log sheets.   Glade Lloyd, MD Phone: [phone]  Fax: [phone]  -  This note was partially dictated with voice recognition software. Similar sounding words can be transcribed inadequately or may not  be corrected upon review.  01/27/2019, 4:42 PM

## 2019-02-01 ENCOUNTER — Telehealth: Payer: Self-pay | Admitting: "Endocrinology

## 2019-02-01 ENCOUNTER — Other Ambulatory Visit: Payer: Self-pay | Admitting: "Endocrinology

## 2019-02-01 DIAGNOSIS — E11311 Type 2 diabetes mellitus with unspecified diabetic retinopathy with macular edema: Secondary | ICD-10-CM

## 2019-02-01 MED ORDER — SITAGLIPTIN PHOSPHATE 100 MG PO TABS: 100.0000 mg | ORAL_TABLET | Freq: Every day | ORAL | 1 refills | Status: DC

## 2019-02-01 NOTE — Telephone Encounter (Signed)
I want her to take 100 mg daily, I will change the order.

## 2019-02-01 NOTE — Telephone Encounter (Signed)
Patient believes the instructions for her sitaGLIPtin (JANUVIA) 100 MG tablet are incorrect. She thought you wanted you to take 100mg  of Januvia but it says to take half a tablet. Patient said she was already taking 50 mg before the change. Please Advise

## 2019-03-14 ENCOUNTER — Other Ambulatory Visit: Payer: Self-pay | Admitting: "Endocrinology

## 2019-04-08 ENCOUNTER — Telehealth: Payer: Self-pay | Admitting: "Endocrinology

## 2019-04-08 NOTE — Telephone Encounter (Signed)
Patient said she has trigger finger, he fingers are going numb. She spoke with her PMD and was advised to contact you. She would like you to call her.

## 2019-04-08 NOTE — Telephone Encounter (Signed)
Left vm

## 2019-04-08 NOTE — Telephone Encounter (Signed)
We do not have anything to offer for trigger finger , she probably needs a hand surgeon or ortho  to fix it. She has to be referred by her PMD.

## 2019-05-03 ENCOUNTER — Ambulatory Visit: Payer: Medicaid Other | Admitting: "Endocrinology

## 2019-05-04 ENCOUNTER — Encounter: Payer: Self-pay | Admitting: "Endocrinology

## 2019-05-04 ENCOUNTER — Ambulatory Visit (INDEPENDENT_AMBULATORY_CARE_PROVIDER_SITE_OTHER): Payer: Medicaid Other | Admitting: "Endocrinology

## 2019-05-04 ENCOUNTER — Other Ambulatory Visit: Payer: Self-pay

## 2019-05-04 VITALS — BP 144/85 | HR 86 | Ht 63.0 in | Wt 155.0 lb

## 2019-05-04 DIAGNOSIS — Z794 Long term (current) use of insulin: Secondary | ICD-10-CM

## 2019-05-04 DIAGNOSIS — I10 Essential (primary) hypertension: Secondary | ICD-10-CM

## 2019-05-04 DIAGNOSIS — E782 Mixed hyperlipidemia: Secondary | ICD-10-CM | POA: Diagnosis not present

## 2019-05-04 DIAGNOSIS — E11311 Type 2 diabetes mellitus with unspecified diabetic retinopathy with macular edema: Secondary | ICD-10-CM | POA: Diagnosis not present

## 2019-05-04 LAB — POCT GLYCOSYLATED HEMOGLOBIN (HGB A1C): Hemoglobin A1C: 8.4 % — AB (ref 4.0–5.6)

## 2019-05-04 MED ORDER — VALSARTAN 40 MG PO TABS: 40.0000 mg | ORAL_TABLET | Freq: Every day | ORAL | 3 refills | Status: DC

## 2019-05-04 NOTE — Patient Instructions (Signed)

## 2019-05-04 NOTE — Progress Notes (Signed)
05/04/2019  Endocrinology follow-up note   Subjective:    Patient ID: Allison [name], female    DOB: 14-Oct-1970, PCP [name], MD   Past Medical History:  Diagnosis Date  . Brain cyst    x2  . Cancer (HCC)    cervical   . Dyslipidemia   . Gastroparesis    possible  . Glaucoma   . Hypertension   . Insulin dependent diabetes mellitus   . Neuropathy   . Retinopathy   . 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 ABDOMINAL HYSTERECTOMY     partial   . EYE SURGERY     multiple  . RADIOLOGY WITH ANESTHESIA N/A 07/07/2017   Procedure: MRI WITH ANESTHESIA,BRAIN WITHOUT CONTRAST;  Surgeon: Radiologist, Medication, MD;  Location: [facility];  Service: Radiology;  Laterality: N/A;   Social History   Socioeconomic History  . Marital status: Divorced    Spouse name: Not on file  . Number of children: Not on file  . Years of education: Not on file  . Highest education level: Not on file  Occupational History  . Not on file  Social Needs  . Financial resource strain: Not on file  . Food insecurity    Worry: Not on file    Inability: Not on file  . Transportation needs    Medical: Not on file    Non-medical: Not on file  Tobacco Use  . Smoking status: Current Some Day Smoker    Packs/day: 0.25    Years: 26.00    Pack years: 6.50    Types: Cigarettes    Last attempt to quit: 05/05/2014    Years since quitting: 5.0  . Smokeless tobacco: Never Used  Substance and Sexual Activity  . Alcohol use: No  . Drug use: No  . Sexual activity: Yes    Birth control/protection: Surgical  Lifestyle  . Physical activity    Days per week: Not on file    Minutes per session: Not on file  . Stress: Not on file  Relationships  . Social Herbalist on phone: Not on file    Gets together: Not on file    Attends religious service: Not on file    Active member of club or organization: Not on file    Attends meetings of  clubs or organizations: Not on file    Relationship status: Not on file  Other Topics Concern  . Not on file  Social History Narrative  . Not on file   Outpatient Encounter Medications as of 05/04/2019  Medication Sig  . ACCU-CHEK AVIVA PLUS test strip USE AS DIRECTED UP TO FOUR TIMES DAILY.  [name] albuterol (PROAIR HFA) 108 (90 BASE) MCG/ACT inhaler Inhale 2 puffs into the lungs every 4 (four) hours as needed for wheezing or shortness of breath.  . ALPHAGAN P 0.1 % SOLN Place 1 drop into both eyes 2 (two) times daily.  [name] ALPRAZolam (XANAX) 0.5 MG tablet Take 0.5 mg by mouth 4 (four) times daily as needed for anxiety.   [name] amphetamine-dextroamphetamine (ADDERALL) 10 MG tablet Take 20 mg by mouth 2 (two) times daily with a meal.   . aspirin EC 81 MG tablet Take 81 mg by mouth daily.  . blood glucose meter kit and supplies KIT Dispense based on patient and insurance preference. Use up to four times daily as directed. (FOR ICD 10 E11.65). (Patient not  taking: Reported on 06/22/2018)  . furosemide (LASIX) 20 MG tablet Take 20 mg by mouth daily.   [name] HYDROcodone-acetaminophen (NORCO) 7.5-325 MG tablet Take 1 tablet by mouth every 4 (four) hours as needed for severe pain.  . Insulin Glargine (LANTUS SOLOSTAR) 100 UNIT/ML Solostar Pen 60 units qhs  . Insulin Pen Needle (B-D ULTRAFINE III SHORT PEN) 31G X 8 MM MISC 1 each by Does not apply route as directed. (Patient not taking: Reported on 06/22/2018)  . loratadine (CLARITIN) 10 MG tablet Take 10 mg by mouth daily.  . Melatonin 5 MG TABS Take 1 tablet by mouth at bedtime.  . moxifloxacin (VIGAMOX) 0.5 % ophthalmic solution Place 1 drop into both eyes See admin instructions. Uses when having eye injections (every 6 weeks)  . Multiple Vitamin (MULTIVITAMIN) tablet Take 2 tablets by mouth daily.   [name] omeprazole (PRILOSEC) 20 MG capsule Take 20 mg by mouth 2 (two) times daily as needed.   . phenylephrine (SUDAFED PE) 10 MG TABS tablet Take 10 mg by mouth every 4  (four) hours as needed.  . polyethylene glycol (MIRALAX / GLYCOLAX) packet Take 17 g by mouth daily.  . potassium chloride SA (K-DUR,KLOR-CON) 20 MEQ tablet Take 20 mEq by mouth 2 (two) times a week.   . sitaGLIPtin (JANUVIA) 100 MG tablet Take 1 tablet (100 mg total) by mouth daily.  . valsartan (DIOVAN) 40 MG tablet Take 1 tablet (40 mg total) by mouth daily.  . [DISCONTINUED] conjugated estrogens (PREMARIN) vaginal cream Place 8.56 Applicatorfuls vaginally 3 (three) times a week. (Patient not taking: Reported on 06/22/2018)  . [DISCONTINUED] metoCLOPramide (REGLAN) 10 MG tablet Take 1 tablet (10 mg total) by mouth every 6 (six) hours as needed for nausea or vomiting. (Patient not taking: Reported on 06/22/2018)  . [DISCONTINUED] ondansetron (ZOFRAN ODT) 4 MG disintegrating tablet Take 1 tablet (4 mg total) by mouth every 8 (eight) hours as needed for nausea or vomiting. (Patient not taking: Reported on 06/22/2018)   Facility-Administered Encounter Medications as of 05/04/2019  Medication  . triamcinolone acetonide (TRIESENCE) 40 MG/ML subtenons injection 4 mg  . triamcinolone acetonide (TRIESENCE) 40 MG/ML subtenons injection 4 mg   ALLERGIES: Allergies  Allergen Reactions  . Cymbalta [Duloxetine Hcl] Shortness Of Breath  . Beta Adrenergic Blockers Palpitations  . Codeine Itching, Dermatitis and Other (See Comments)    TYLENOL #3   Welts and itching  . Lexapro [Escitalopram Oxalate] Hives and Palpitations  . Other Palpitations    Allergic to all beta blocker eye drops!!  . Victoza [Liraglutide] Nausea And Vomiting  . Doxycycline Other (See Comments)    UNSPECIFIED Non-specific Heart problems  . Oxycodone Itching   VACCINATION STATUS:  There is no immunization history on file for this patient.  Diabetes She presents for her follow-up diabetic visit. She has type 2 diabetes mellitus. Onset time: She was diagnosed at approximate age of 71 years. Her disease course has been improving.  There are no hypoglycemic associated symptoms. Pertinent negatives for hypoglycemia include no confusion, headaches, pallor or seizures. Pertinent negatives for diabetes include no blurred vision, no chest pain, no polydipsia, no polyphagia and no polyuria. There are no hypoglycemic complications. Symptoms are improving. Diabetic complications include retinopathy. Risk factors for coronary artery disease include diabetes mellitus, dyslipidemia, hypertension, tobacco exposure and sedentary lifestyle. Current diabetic treatment includes insulin injections and oral agent (monotherapy). She is compliant with treatment most of the time. Her weight is fluctuating minimally. She is following a generally unhealthy  diet. When asked about meal planning, she reported none. She never participates in exercise. There is no change in her home blood glucose trend. Her breakfast blood glucose range is generally 180-200 mg/dl. Her overall blood glucose range is 180-200 mg/dl. (She returns with a meter showing average blood glucose of 215 over the last 14 days and her point-of-care A1c is 8.4% slightly improving from 8.8%.) Eye exam is current.  Hyperlipidemia This is a chronic problem. The current episode started more than 1 year ago. The problem is controlled. Exacerbating diseases include diabetes. Pertinent negatives include no chest pain, myalgias or shortness of breath. Risk factors for coronary artery disease include diabetes mellitus, dyslipidemia, hypertension and a sedentary lifestyle.  Hypertension This is a chronic problem. The current episode started more than 1 year ago. The problem is uncontrolled. Pertinent negatives include no blurred vision, chest pain, headaches, palpitations or shortness of breath. Risk factors for coronary artery disease include dyslipidemia, diabetes mellitus, obesity, sedentary lifestyle and smoking/tobacco exposure. Past treatments include nothing. Hypertensive end-organ damage includes  retinopathy.   Review of systems: Limited as above.   Objective:    BP (!) 144/85   Pulse 86   Ht 5' 3"  (1.6 m)   Wt 155 lb (70.3 kg)   BMI 27.46 kg/m   Wt Readings from Last 3 Encounters:  05/04/19 155 lb (70.3 kg)  06/22/18 154 lb (69.9 kg)  05/07/18 158 lb (71.7 kg)    Foot exam is normal on May 07, 2018.  Results for orders placed or performed in visit on 05/04/19  HgB A1c  Result Value Ref Range   Hemoglobin A1C 8.4 (A) 4.0 - 5.6 %   HbA1c POC (<> result, manual entry)     HbA1c, POC (prediabetic range)     HbA1c, POC (controlled diabetic range)     Diabetic Labs (most recent): Lab Results  Component Value Date   HGBA1C 8.4 (A) 05/04/2019   HGBA1C 8.8 (H) 01/18/2019   HGBA1C 8.7 (H) 09/13/2018   Lipid Panel     Component Value Date/Time   CHOL 147 04/20/2017 0943   TRIG 182 (H) 04/20/2017 0943   HDL 41 04/20/2017 0943   LDLCALC 70 04/20/2017 0943     Assessment & Plan:   1. 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 These are all discussed in detail with the patient.  -She presents with a meter showing average blood glucose of 215 for the last 14 days, 190 for the last 30 days.  Her point-of-care A1c is 8.4% slightly improving from 8.8%.  She has no hypoglycemic episodes.    Recent labs reviewed, showing normal renal function.  - I have re-counseled the patient on diet management  by adopting a carbohydrate restricted / protein rich  Diet.  - she  admits there is a room for improvement in her diet and drink choices. -  Suggestion is made for her to avoid simple carbohydrates  from her diet including Cakes, Sweet Desserts / Pastries, Ice  Cream, Soda (diet and regular), Sweet Tea, Candies, Chips, Cookies, Sweet Pastries,  Store Bought Juices, Alcohol in Excess of  1-2 drinks a day, Artificial Sweeteners, Coffee Creamer, and "Sugar-free" Products. This will help patient  to have stable blood glucose profile and potentially avoid unintended weight gain.   - Patient is advised to stick to a routine mealtimes to eat 3 meals  a day and avoid unnecessary snacks ( to snack only to correct hypoglycemia).  - I have approached patient with the following individualized plan to manage diabetes and patient agrees.  -She continues to tolerate Januvia, advised to continue Januvia 100 mg p.o. daily at breakfast.   -She did not tolerate metformin. -She is advised to increase her Lantus to 70 units nightly,   associated with strict monitoring of blood glucose 2 times a day-daily before breakfast and at bedtime.  -She is encouraged to call clinic for fasting blood glucose readings above 200 mg per DL.   2) BP/HTN: Her blood pressure is not controlled.  She did have previous history of hypertension which reportedly has improved with weight loss.  She is approached for treatment.  Agrees to start Diovan 40 mg p.o. daily at breakfast.   3) Lipids/HPL: Her recent lipid panel showed controlled LDL at 70.  She is not on statins.  She will have fasting lipid panel before next visit.  4) Chronic Care/Health Maintenance:  -Patient is  encouraged to continue to follow up with Ophthalmology, Podiatrist at least yearly or according to recommendations, and advised to quit smoking. I have recommended yearly flu vaccine and pneumonia vaccination at least every 5 years; moderate intensity exercise for up to 150 minutes weekly; and  sleep for at least 7 hours a day.  - I advised patient to maintain close follow up with [name], MD for primary care needs.  - Time spent with the patient: 25 min, of which >50% was spent in reviewing her blood glucose logs ,  discussing her hypoglycemia and hyperglycemia episodes, reviewing her current and  previous labs / studies and medications  doses and developing a plan to avoid hypoglycemia and hyperglycemia. Please refer to Patient Instructions for Blood Glucose Monitoring and Insulin/Medications Dosing Guide"  in media tab for additional information. Please  also refer to " Patient Self Inventory" in the Media  tab for reviewed elements of pertinent patient history.  Stephens Shire participated in the discussions, expressed understanding, and voiced agreement with the above plans.  All questions were answered to her satisfaction. she is encouraged to contact clinic should she have any questions or concerns prior to her return visit.   Follow up plan: -Return in about 3 months (around 08/02/2019) for Follow up with Pre-visit Labs, Meter, and Logs.   Glade Lloyd, MD Phone: [phone]  Fax: [phone]  -  This note was partially dictated with voice recognition software. Similar sounding words can be transcribed inadequately or may not  be corrected upon review.  05/04/2019, 9:28 AM

## 2019-05-09 ENCOUNTER — Other Ambulatory Visit: Payer: Self-pay | Admitting: "Endocrinology

## 2019-05-10 ENCOUNTER — Telehealth: Payer: Self-pay | Admitting: "Endocrinology

## 2019-05-10 NOTE — Telephone Encounter (Signed)
Pt said she is having crazy side effects from the Diovan. She said when she took it in the past she was able to handle it. She said she would prefer not to take this

## 2019-05-10 NOTE — Telephone Encounter (Signed)
Patient verbalized understanding  

## 2019-05-10 NOTE — Telephone Encounter (Signed)
She can stay off of it, we will discuss options next visit.

## 2019-05-10 NOTE — Telephone Encounter (Signed)
Pt is requesting refill for Lasix. I do not see where we have prescribed that here.

## 2019-06-07 ENCOUNTER — Telehealth: Payer: Self-pay | Admitting: "Endocrinology

## 2019-06-07 NOTE — Telephone Encounter (Signed)
Patient called and said she stomped her toe and now it is infected. I advised her to call her PMD but she said since you were her diabetic doctor she wanted your advise. Please advise.

## 2019-06-07 NOTE — Telephone Encounter (Signed)
Yes,  You are correct. She has to see her PMD. This is not related to her diabetes.

## 2019-06-07 NOTE — Telephone Encounter (Signed)
Patient notified

## 2019-06-14 ENCOUNTER — Telehealth: Payer: Self-pay

## 2019-06-14 NOTE — Telephone Encounter (Signed)
Pt is calling regarding her Pre Auth for Januvia

## 2019-06-14 NOTE — Telephone Encounter (Signed)
PA approved and sent to pharmacy

## 2019-07-12 ENCOUNTER — Other Ambulatory Visit: Payer: Self-pay | Admitting: "Endocrinology

## 2019-07-12 DIAGNOSIS — E11311 Type 2 diabetes mellitus with unspecified diabetic retinopathy with macular edema: Secondary | ICD-10-CM

## 2019-07-12 DIAGNOSIS — Z794 Long term (current) use of insulin: Secondary | ICD-10-CM

## 2019-07-28 ENCOUNTER — Other Ambulatory Visit: Payer: Self-pay | Admitting: "Endocrinology

## 2019-07-28 LAB — COMPREHENSIVE METABOLIC PANEL
GFR calc Af Amer: 103
GFR calc non Af Amer: 89

## 2019-07-28 LAB — BASIC METABOLIC PANEL
BUN: 12 (ref 4–21)
Creatinine: 0.8 (ref <=1.1)
Glucose: 134
Sodium: 137 (ref 137–147)

## 2019-07-31 LAB — COMPREHENSIVE METABOLIC PANEL
ALT: 15 IU/L (ref 0–32)
AST: 14 IU/L (ref 0–40)
Albumin/Globulin Ratio: 1.6 (ref 1.2–2.2)
Albumin: 4.5 g/dL (ref 3.8–4.8)
Alkaline Phosphatase: 82 IU/L (ref 39–117)
BUN/Creatinine Ratio: 15 (ref 9–23)
BUN: 12 mg/dL (ref 6–24)
Bilirubin Total: 0.4 mg/dL (ref 0.0–1.2)
CO2: 23 mmol/L (ref 20–29)
Calcium: 10.1 mg/dL (ref 8.7–10.2)
Chloride: 100 mmol/L (ref 96–106)
Creatinine, Ser: 0.79 mg/dL (ref 0.57–1.00)
GFR calc Af Amer: 103 mL/min/{1.73_m2} (ref >59)
GFR calc non Af Amer: 89 mL/min/{1.73_m2} (ref >59)
Globulin, Total: 2.8 g/dL (ref 1.5–4.5)
Glucose: 134 mg/dL — ABNORMAL HIGH (ref 65–99)
Potassium: 4.5 mmol/L (ref 3.5–5.2)
Sodium: 137 mmol/L (ref 134–144)
Total Protein: 7.3 g/dL (ref 6.0–8.5)

## 2019-07-31 LAB — CALCITRIOL (1,25 DI-OH VIT D): Vit D, 1,25-Dihydroxy: 55 pg/mL (ref 19.9–79.3)

## 2019-07-31 LAB — SPECIMEN STATUS REPORT

## 2019-07-31 LAB — LIPID PANEL W/O CHOL/HDL RATIO
Cholesterol, Total: 184 mg/dL (ref 100–199)
HDL: 52 mg/dL (ref >39)
LDL Chol Calc (NIH): 117 mg/dL — ABNORMAL HIGH (ref 0–99)
Triglycerides: 81 mg/dL (ref 0–149)
VLDL Cholesterol Cal: 15 mg/dL (ref 5–40)

## 2019-08-03 ENCOUNTER — Other Ambulatory Visit: Payer: Self-pay

## 2019-08-03 ENCOUNTER — Encounter: Payer: Self-pay | Admitting: "Endocrinology

## 2019-08-03 ENCOUNTER — Ambulatory Visit: Payer: Medicaid Other | Admitting: "Endocrinology

## 2019-08-03 ENCOUNTER — Ambulatory Visit (INDEPENDENT_AMBULATORY_CARE_PROVIDER_SITE_OTHER): Payer: Medicaid Other | Admitting: "Endocrinology

## 2019-08-03 VITALS — BP 134/84 | HR 90 | Ht 63.0 in | Wt 148.0 lb

## 2019-08-03 DIAGNOSIS — E782 Mixed hyperlipidemia: Secondary | ICD-10-CM

## 2019-08-03 DIAGNOSIS — E11311 Type 2 diabetes mellitus with unspecified diabetic retinopathy with macular edema: Secondary | ICD-10-CM | POA: Diagnosis not present

## 2019-08-03 DIAGNOSIS — Z794 Long term (current) use of insulin: Secondary | ICD-10-CM

## 2019-08-03 DIAGNOSIS — I10 Essential (primary) hypertension: Secondary | ICD-10-CM | POA: Diagnosis not present

## 2019-08-03 LAB — POCT GLYCOSYLATED HEMOGLOBIN (HGB A1C): Hemoglobin A1C: 9.2 % — AB (ref 4.0–5.6)

## 2019-08-03 MED ORDER — ROSUVASTATIN CALCIUM 5 MG PO TABS: 5.0000 mg | ORAL_TABLET | Freq: Every day | ORAL | 1 refills | Status: DC

## 2019-08-03 MED ORDER — LANTUS SOLOSTAR 100 UNIT/ML ~~LOC~~ SOPN: PEN_INJECTOR | SUBCUTANEOUS | 3 refills | Status: DC

## 2019-08-03 MED ORDER — ACCU-CHEK AVIVA PLUS VI STRP: ORAL_STRIP | 5 refills | Status: DC

## 2019-08-03 MED ORDER — GLIPIZIDE ER 5 MG PO TB24: 5.0000 mg | ORAL_TABLET | Freq: Every day | ORAL | 1 refills | Status: DC

## 2019-08-03 NOTE — Progress Notes (Signed)
08/03/2019  Endocrinology follow-up note   Subjective:    Patient ID: Allison [name], female    DOB: 12-27-1970, PCP [name], MD   Past Medical History:  Diagnosis Date  . Brain cyst    x2  . Cancer (HCC)    cervical   . Dyslipidemia   . Gastroparesis    possible  . Glaucoma   . Hypertension   . Insulin dependent diabetes mellitus   . Neuropathy   . Retinopathy   . 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 ABDOMINAL HYSTERECTOMY     partial   . EYE SURGERY     multiple  . RADIOLOGY WITH ANESTHESIA N/A 07/07/2017   Procedure: MRI WITH ANESTHESIA,BRAIN WITHOUT CONTRAST;  Surgeon: Radiologist, Medication, MD;  Location: [facility];  Service: Radiology;  Laterality: N/A;   Social History   Socioeconomic History  . Marital status: Divorced    Spouse name: Not on file  . Number of children: Not on file  . Years of education: Not on file  . Highest education level: Not on file  Occupational History  . Not on file  Tobacco Use  . Smoking status: Current Some Day Smoker    Packs/day: 0.25    Years: 26.00    Pack years: 6.50    Types: Cigarettes    Last attempt to quit: 05/05/2014    Years since quitting: 5.2  . Smokeless tobacco: Never Used  Substance and Sexual Activity  . Alcohol use: No  . Drug use: No  . Sexual activity: Yes    Birth control/protection: Surgical  Other Topics Concern  . Not on file  Social History Narrative  . Not on file   Social Determinants of Health   Financial Resource Strain:   . Difficulty of Paying Living Expenses: Not on file  Food Insecurity:   . Worried About Charity fundraiser in the Last Year: Not on file  . Ran Out of Food in the Last Year: Not on file  Transportation Needs:   . Lack of Transportation (Medical): Not on file  . Lack of Transportation (Non-Medical): Not on file  Physical Activity:   . Days of Exercise per Week: Not on file  . Minutes of Exercise  per Session: Not on file  Stress:   . Feeling of Stress : Not on file  Social Connections:   . Frequency of Communication with Friends and Family: Not on file  . Frequency of Social Gatherings with Friends and Family: Not on file  . Attends Religious Services: Not on file  . Active Member of Clubs or Organizations: Not on file  . Attends Archivist Meetings: Not on file  . Marital Status: Not on file   Outpatient Encounter Medications as of 08/03/2019  Medication Sig  . albuterol (PROAIR HFA) 108 (90 BASE) MCG/ACT inhaler Inhale 2 puffs into the lungs every 4 (four) hours as needed for wheezing or shortness of breath.  . ALPHAGAN P 0.1 % SOLN Place 1 drop into both eyes 2 (two) times daily.  [name] ALPRAZolam (XANAX) 0.5 MG tablet Take 0.5 mg by mouth 4 (four) times daily as needed for anxiety.   [name] amphetamine-dextroamphetamine (ADDERALL) 10 MG tablet Take 20 mg by mouth 2 (two) times daily with a meal.   . aspirin EC 81 MG tablet Take 81 mg by mouth daily.  . blood glucose meter kit and supplies KIT  Dispense based on patient and insurance preference. Use up to four times daily as directed. (FOR ICD 10 E11.65). (Patient not taking: Reported on 06/22/2018)  . furosemide (LASIX) 40 MG tablet ORAL TAKE 1 TABLET IF NEEDED FOR FLUID.  [name] glipiZIDE (GLUCOTROL XL) 5 MG 24 hr tablet Take 1 tablet (5 mg total) by mouth daily with breakfast.  . glucose blood (ACCU-CHEK AVIVA PLUS) test strip USE AS DIRECTED UP TO FOUR TIMES DAILY.  [name] HYDROcodone-acetaminophen (NORCO) 7.5-325 MG tablet Take 1 tablet by mouth every 4 (four) hours as needed for severe pain.  [name] insulin glargine (LANTUS SOLOSTAR) 100 UNIT/ML Solostar Pen 80 units qhs  . Insulin Pen Needle (B-D ULTRAFINE III SHORT PEN) 31G X 8 MM MISC 1 each by Does not apply route as directed. (Patient not taking: Reported on 06/22/2018)  . JANUVIA 100 MG tablet TAKE ONE TABLET BY MOUTH ONCE DAILY.  [name] loratadine (CLARITIN) 10 MG tablet Take 10 mg by  mouth daily.  . Melatonin 5 MG TABS Take 1 tablet by mouth at bedtime.  . moxifloxacin (VIGAMOX) 0.5 % ophthalmic solution Place 1 drop into both eyes See admin instructions. Uses when having eye injections (every 6 weeks)  . Multiple Vitamin (MULTIVITAMIN) tablet Take 2 tablets by mouth daily.   [name] omeprazole (PRILOSEC) 20 MG capsule Take 20 mg by mouth 2 (two) times daily as needed.   . phenylephrine (SUDAFED PE) 10 MG TABS tablet Take 10 mg by mouth every 4 (four) hours as needed.  . polyethylene glycol (MIRALAX / GLYCOLAX) packet Take 17 g by mouth daily.  . potassium chloride SA (K-DUR,KLOR-CON) 20 MEQ tablet Take 20 mEq by mouth 2 (two) times a week.   . rosuvastatin (CRESTOR) 5 MG tablet Take 1 tablet (5 mg total) by mouth daily.  . [DISCONTINUED] ACCU-CHEK AVIVA PLUS test strip USE AS DIRECTED UP TO FOUR TIMES DAILY.  . [DISCONTINUED] Insulin Glargine (LANTUS SOLOSTAR) 100 UNIT/ML Solostar Pen 60 units qhs  . [DISCONTINUED] valsartan (DIOVAN) 40 MG tablet Take 1 tablet (40 mg total) by mouth daily.   Facility-Administered Encounter Medications as of 08/03/2019  Medication  . triamcinolone acetonide (TRIESENCE) 40 MG/ML subtenons injection 4 mg  . triamcinolone acetonide (TRIESENCE) 40 MG/ML subtenons injection 4 mg   ALLERGIES: Allergies  Allergen Reactions  . Cymbalta [Duloxetine Hcl] Shortness Of Breath  . Beta Adrenergic Blockers Palpitations  . Codeine Itching, Dermatitis and Other (See Comments)    TYLENOL #3   Welts and itching  . Lexapro [Escitalopram Oxalate] Hives and Palpitations  . Other Palpitations    Allergic to all beta blocker eye drops!!  . Victoza [Liraglutide] Nausea And Vomiting  . Doxycycline Other (See Comments)    UNSPECIFIED Non-specific Heart problems  . Lyrica [Pregabalin] Palpitations  . Oxycodone Itching   VACCINATION STATUS:  There is no immunization history on file for this patient.  Diabetes She presents for her follow-up diabetic visit.  She has type 2 diabetes mellitus. Onset time: She was diagnosed at approximate age of 19 years. Her disease course has been worsening. There are no hypoglycemic associated symptoms. Pertinent negatives for hypoglycemia include no confusion, headaches, pallor or seizures. Pertinent negatives for diabetes include no blurred vision, no chest pain, no polydipsia, no polyphagia and no polyuria. There are no hypoglycemic complications. Symptoms are worsening. Diabetic complications include retinopathy. Risk factors for coronary artery disease include diabetes mellitus, dyslipidemia, hypertension, tobacco exposure and sedentary lifestyle. Current diabetic treatment includes insulin injections and oral agent (monotherapy).  She is compliant with treatment most of the time. Her weight is decreasing steadily. She is following a generally unhealthy diet. When asked about meal planning, she reported none. She never participates in exercise. There is no change in her home blood glucose trend. Her breakfast blood glucose range is generally 180-200 mg/dl. Her bedtime blood glucose range is generally >200 mg/dl. Her overall blood glucose range is 180-200 mg/dl. (She returns with a meter showing average blood glucose of   190, and point-of-care A1c is 9.2% increasing from 8.4%.  She has no hypoglycemia.   ) Eye exam is current.  Hyperlipidemia This is a chronic problem. The current episode started more than 1 year ago. The problem is controlled. Exacerbating diseases include diabetes. Pertinent negatives include no chest pain, myalgias or shortness of breath. Risk factors for coronary artery disease include diabetes mellitus, dyslipidemia, hypertension and a sedentary lifestyle.  Hypertension This is a chronic problem. The current episode started more than 1 year ago. The problem is uncontrolled. Pertinent negatives include no blurred vision, chest pain, headaches, palpitations or shortness of breath. Risk factors for coronary  artery disease include dyslipidemia, diabetes mellitus, obesity, sedentary lifestyle and smoking/tobacco exposure. Past treatments include nothing. Hypertensive end-organ damage includes retinopathy.   Review of systems: Limited as above.   Objective:    BP 134/84   Pulse 90   Ht 5' 3"  (1.6 m)   Wt 148 lb (67.1 kg)   BMI 26.22 kg/m   Wt Readings from Last 3 Encounters:  08/03/19 148 lb (67.1 kg)  05/04/19 155 lb (70.3 kg)  06/22/18 154 lb (69.9 kg)    Foot exam is normal on May 07, 2018.  Results for orders placed or performed in visit on 08/03/19  HgB A1c  Result Value Ref Range   Hemoglobin A1C 9.2 (A) 4.0 - 5.6 %   HbA1c POC (<> result, manual entry)     HbA1c, POC (prediabetic range)     HbA1c, POC (controlled diabetic range)     Diabetic Labs (most recent): Lab Results  Component Value Date   HGBA1C 9.2 (A) 08/03/2019   HGBA1C 8.4 (A) 05/04/2019   HGBA1C 8.8 (H) 01/18/2019   Lipid Panel     Component Value Date/Time   CHOL 184 07/28/2019 1018   TRIG 81 07/28/2019 1018   HDL 52 07/28/2019 1018   LDLCALC 117 (H) 07/28/2019 1018     Assessment & Plan:   1. 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 These are all discussed in detail with the patient.  She returns with a meter showing average blood glucose of   190, and point-of-care A1c is 9.2% increasing from 8.4%.  She has no hypoglycemia.     Recent labs reviewed, showing normal renal function.  - I have re-counseled the patient on diet management  by adopting a carbohydrate restricted / protein rich  Diet.   -  Suggestion is made for her to avoid simple carbohydrates  from her diet  including Cakes, Sweet Desserts / Pastries, Ice Cream, Soda (diet and regular), Sweet Tea, Candies, Chips, Cookies, Sweet Pastries,  Store Bought Juices, Alcohol in Excess of  1-2 drinks a day, Artificial Sweeteners, Coffee Creamer, and "Sugar-free"  Products. This will help patient to have stable blood glucose profile and potentially avoid unintended weight gain.   - Patient is advised to stick to a routine mealtimes to eat 3 meals  a day and avoid unnecessary snacks ( to snack only to correct hypoglycemia).  - I have approached patient with the following individualized plan to manage diabetes and patient agrees.  -She continues to tolerate Januvia, advised to continue Januvia 100 mg p.o. daily at breakfast.   -She did not tolerate metformin. -She is advised to increase her Lantus to 80 units nightly,   associated with strict monitoring of blood glucose 4 times a day-daily before meals and at bedtime and will have a phone visit in 15 days to see if she is cleared for carpal tunnel syndrome surgery.    -She is encouraged to call clinic for fasting blood glucose readings above 200 mg per DL. -She may benefit from low-dose glipizide.  I discussed and initiated glipizide 5 mg XL p.o. daily at breakfast.   2) BP/HTN: Her blood pressure is controlled to target. She did have previous history of hypertension which reportedly has improved with weight loss.  She is approached for treatment.  Agrees to start Diovan 40 mg p.o. daily at breakfast.   3) Lipids/HPL: Her recent lipid panel showed total LDL at 117, increasing from 70.   I discussed and initiated Crestor 5 mg p.o. nightly.  Side effects and precautions discussed with her.     4) Chronic Care/Health Maintenance:  -Patient is  encouraged to continue to follow up with Ophthalmology, Podiatrist at least yearly or according to recommendations, and advised to quit smoking. I have recommended yearly flu vaccine and pneumonia vaccination at least every  5 years; moderate intensity exercise for up to 150 minutes weekly; and  sleep for at least 7 hours a day.  - I advised patient to maintain close follow up with [name], MD for her primary care needs.  - Time spent on this patient care encounter:  35 min, of which > 50% was spent in  counseling and the rest reviewing her blood glucose logs , discussing her hypoglycemia and hyperglycemia episodes, reviewing her current and  previous labs / studies  ( including abstraction from other facilities) and medications  doses and developing a  long term treatment plan and documenting her care.   Please refer to Patient Instructions for Blood Glucose Monitoring and Insulin/Medications Dosing Guide"  in media tab for additional information. Please  also refer to " Patient Self Inventory" in the Media  tab for reviewed elements of pertinent patient history.  Stephens Shire participated in the discussions, expressed understanding, and voiced agreement with the above plans.  All questions were answered to her satisfaction. she is encouraged to contact clinic should she have any questions or concerns prior to her return visit.  Follow up plan: -Return in about 10 days (around 08/13/2019), or phone, for Follow up with Meter and Logs Only - no Labs.   Glade Lloyd, MD Phone: [phone]  Fax: [phone]  -  This note was partially dictated with voice recognition software. Similar sounding words can be transcribed inadequately or may not  be corrected upon review.  08/03/2019, 5:53 PM

## 2019-08-03 NOTE — Patient Instructions (Signed)

## 2019-08-05 ENCOUNTER — Telehealth: Payer: Self-pay | Admitting: "Endocrinology

## 2019-08-05 NOTE — Telephone Encounter (Signed)
Left VM

## 2019-08-05 NOTE — Telephone Encounter (Signed)
Pt left a VM that she was to call if her sugar was over 150. She had a reading last night of 283 and this morning of 154.

## 2019-08-05 NOTE — Telephone Encounter (Signed)
Call back if morning numbers ( not evening )  are above 150 , 3 in a week.

## 2019-08-16 ENCOUNTER — Ambulatory Visit (INDEPENDENT_AMBULATORY_CARE_PROVIDER_SITE_OTHER): Payer: Medicaid Other | Admitting: "Endocrinology

## 2019-08-16 ENCOUNTER — Encounter: Payer: Self-pay | Admitting: "Endocrinology

## 2019-08-16 DIAGNOSIS — E782 Mixed hyperlipidemia: Secondary | ICD-10-CM | POA: Diagnosis not present

## 2019-08-16 DIAGNOSIS — Z794 Long term (current) use of insulin: Secondary | ICD-10-CM

## 2019-08-16 DIAGNOSIS — E11311 Type 2 diabetes mellitus with unspecified diabetic retinopathy with macular edema: Secondary | ICD-10-CM

## 2019-08-16 DIAGNOSIS — I10 Essential (primary) hypertension: Secondary | ICD-10-CM

## 2019-08-16 NOTE — Patient Instructions (Signed)

## 2019-08-16 NOTE — Progress Notes (Signed)
08/16/2019                                                     Endocrinology Telehealth Visit Follow up Note -During COVID -19 Pandemic  This visit type was conducted due to national recommendations for restrictions regarding the COVID-19 Pandemic  in an effort to limit this patient's exposure and mitigate transmission of the corona virus.  Due to her co-morbid illnesses, Allison [name] is at  moderate to high risk for complications without adequate follow up.  This format is felt to be most appropriate for her at this time.  I connected with this patient on 08/16/2019   by telephone and verified that I am speaking with the correct person using two identifiers. Stephens Shire, 08-19-1970. she has verbally consented to this visit. All issues noted in this document were discussed and addressed. The format was not optimal for physical exam.     Subjective:    Patient ID: Allison [name], female    DOB: 1970/11/11, PCP [name], MD   Past Medical History:  Diagnosis Date  . Brain cyst    x2  . Cancer (HCC)    cervical   . Dyslipidemia   . Gastroparesis    possible  . Glaucoma   . Hypertension   . Insulin dependent diabetes mellitus   . Neuropathy   . Retinopathy   . 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 ABDOMINAL HYSTERECTOMY     partial   . EYE SURGERY     multiple  . RADIOLOGY WITH ANESTHESIA N/A 07/07/2017   Procedure: MRI WITH ANESTHESIA,BRAIN WITHOUT CONTRAST;  Surgeon: Radiologist, Medication, MD;  Location: [facility];  Service: Radiology;  Laterality: N/A;   Social History   Socioeconomic History  . Marital status: Divorced    Spouse name: Not on file  . Number of children: Not on file  . Years of education: Not on file  . Highest education level: Not on file  Occupational History  . Not on file  Tobacco Use  . Smoking status: Current Some Day Smoker    Packs/day: 0.25    Years: 26.00    Pack years: 6.50     Types: Cigarettes    Last attempt to quit: 05/05/2014    Years since quitting: 5.2  . Smokeless tobacco: Never Used  Substance and Sexual Activity  . Alcohol use: No  . Drug use: No  . Sexual activity: Yes    Birth control/protection: Surgical  Other Topics Concern  . Not on file  Social History Narrative  . Not on file   Social Determinants of Health   Financial Resource Strain:   . Difficulty of Paying Living Expenses:   Food Insecurity:   . Worried About Charity fundraiser in the Last Year:   . Arboriculturist in the Last Year:   Transportation Needs:   . Film/video editor (Medical):   [name] Lack of Transportation (Non-Medical):   Physical Activity:   . Days of Exercise per Week:   . Minutes of Exercise per Session:   Stress:   . Feeling of Stress :   Social Connections:   . Frequency of Communication with Friends and Family:   . Frequency of Social Gatherings  with Friends and Family:   . Attends Religious Services:   . Active Member of Clubs or Organizations:   . Attends Archivist Meetings:   [name] Marital Status:    Outpatient Encounter Medications as of 08/16/2019  Medication Sig  . albuterol (PROAIR HFA) 108 (90 BASE) MCG/ACT inhaler Inhale 2 puffs into the lungs every 4 (four) hours as needed for wheezing or shortness of breath.  . ALPHAGAN P 0.1 % SOLN Place 1 drop into both eyes 2 (two) times daily.  [name] ALPRAZolam (XANAX) 0.5 MG tablet Take 0.5 mg by mouth 4 (four) times daily as needed for anxiety.   [name] amphetamine-dextroamphetamine (ADDERALL) 10 MG tablet Take 20 mg by mouth 2 (two) times daily with a meal.   . aspirin EC 81 MG tablet Take 81 mg by mouth daily.  . blood glucose meter kit and supplies KIT Dispense based on patient and insurance preference. Use up to four times daily as directed. (FOR ICD 10 E11.65). (Patient not taking: Reported on 06/22/2018)  . furosemide (LASIX) 40 MG tablet ORAL TAKE 1 TABLET IF NEEDED FOR FLUID.  [name] glipiZIDE  (GLUCOTROL XL) 5 MG 24 hr tablet Take 1 tablet (5 mg total) by mouth daily with breakfast.  . glucose blood (ACCU-CHEK AVIVA PLUS) test strip USE AS DIRECTED UP TO FOUR TIMES DAILY.  [name] HYDROcodone-acetaminophen (NORCO) 7.5-325 MG tablet Take 1 tablet by mouth every 4 (four) hours as needed for severe pain.  [name] insulin glargine (LANTUS SOLOSTAR) 100 UNIT/ML Solostar Pen 80 units qhs  . Insulin Pen Needle (B-D ULTRAFINE III SHORT PEN) 31G X 8 MM MISC 1 each by Does not apply route as directed. (Patient not taking: Reported on 06/22/2018)  . JANUVIA 100 MG tablet TAKE ONE TABLET BY MOUTH ONCE DAILY.  [name] loratadine (CLARITIN) 10 MG tablet Take 10 mg by mouth daily.  . Melatonin 5 MG TABS Take 1 tablet by mouth at bedtime.  . moxifloxacin (VIGAMOX) 0.5 % ophthalmic solution Place 1 drop into both eyes See admin instructions. Uses when having eye injections (every 6 weeks)  . Multiple Vitamin (MULTIVITAMIN) tablet Take 2 tablets by mouth daily.   [name] omeprazole (PRILOSEC) 20 MG capsule Take 20 mg by mouth 2 (two) times daily as needed.   . phenylephrine (SUDAFED PE) 10 MG TABS tablet Take 10 mg by mouth every 4 (four) hours as needed.  . polyethylene glycol (MIRALAX / GLYCOLAX) packet Take 17 g by mouth daily.  . potassium chloride SA (K-DUR,KLOR-CON) 20 MEQ tablet Take 20 mEq by mouth 2 (two) times a week.   . rosuvastatin (CRESTOR) 5 MG tablet Take 1 tablet (5 mg total) by mouth daily.   Facility-Administered Encounter Medications as of 08/16/2019  Medication  . triamcinolone acetonide (TRIESENCE) 40 MG/ML subtenons injection 4 mg  . triamcinolone acetonide (TRIESENCE) 40 MG/ML subtenons injection 4 mg   ALLERGIES: Allergies  Allergen Reactions  . Cymbalta [Duloxetine Hcl] Shortness Of Breath  . Beta Adrenergic Blockers Palpitations  . Codeine Itching, Dermatitis and Other (See Comments)    TYLENOL #3   Welts and itching  . Lexapro [Escitalopram Oxalate] Hives and Palpitations  . Other  Palpitations    Allergic to all beta blocker eye drops!!  . Victoza [Liraglutide] Nausea And Vomiting  . Doxycycline Other (See Comments)    UNSPECIFIED Non-specific Heart problems  . Lyrica [Pregabalin] Palpitations  . Oxycodone Itching   VACCINATION STATUS:  There is no immunization history on file for this patient.  Diabetes She presents for her follow-up diabetic visit. She has type 2 diabetes mellitus. Onset time: She was diagnosed at approximate age of 52 years. Her disease course has been improving. There are no hypoglycemic associated symptoms. Pertinent negatives for hypoglycemia include no confusion, headaches, pallor or seizures. Pertinent negatives for diabetes include no blurred vision, no chest pain, no polydipsia, no polyphagia and no polyuria. There are no hypoglycemic complications. Symptoms are improving. Diabetic complications include retinopathy. Risk factors for coronary artery disease include diabetes mellitus, dyslipidemia, hypertension, tobacco exposure and sedentary lifestyle. Current diabetic treatment includes insulin injections and oral agent (monotherapy). She is compliant with treatment most of the time. Her weight is decreasing steadily. She is following a generally unhealthy diet. When asked about meal planning, she reported none. She never participates in exercise. Her home blood glucose trend is decreasing steadily. Her breakfast blood glucose range is generally 130-140 mg/dl. Her lunch blood glucose range is generally 140-180 mg/dl. Her dinner blood glucose range is generally 140-180 mg/dl. Her bedtime blood glucose range is generally 140-180 mg/dl. Her overall blood glucose range is 140-180 mg/dl. (She reports significant improvement in her glycemic profile over the last 7 days: Fasting 87, 141, 182, 148, 97, 194, 139, 128 Prelunch 102, 119, 129, 203, 191, 242, 111, 142 Pre-supper 119, 182, 146, 73, 139, 113, 156, 165 Bedtime 203, 191, 129, 182, 141, 125, 138,  159, 140 She denies any major hypoglycemia.  Her recent point-of-care A1c was 9.2% increasing from 8.4%.  ) Eye exam is current.  Hyperlipidemia This is a chronic problem. The current episode started more than 1 year ago. The problem is controlled. Exacerbating diseases include diabetes. Pertinent negatives include no chest pain, myalgias or shortness of breath. Risk factors for coronary artery disease include diabetes mellitus, dyslipidemia, hypertension and a sedentary lifestyle.  Hypertension This is a chronic problem. The current episode started more than 1 year ago. The problem is uncontrolled. Pertinent negatives include no blurred vision, chest pain, headaches, palpitations or shortness of breath. Risk factors for coronary artery disease include dyslipidemia, diabetes mellitus, obesity, sedentary lifestyle and smoking/tobacco exposure. Past treatments include nothing. Hypertensive end-organ damage includes retinopathy.   Review of systems: Limited as above.   Objective:    There were no vitals taken for this visit.  Wt Readings from Last 3 Encounters:  08/03/19 148 lb (67.1 kg)  05/04/19 155 lb (70.3 kg)  06/22/18 154 lb (69.9 kg)    Foot exam is normal on May 07, 2018.  Results for orders placed or performed in visit on 08/03/19  HgB A1c  Result Value Ref Range   Hemoglobin A1C 9.2 (A) 4.0 - 5.6 %   HbA1c POC (<> result, manual entry)     HbA1c, POC (prediabetic range)     HbA1c, POC (controlled diabetic range)     Diabetic Labs (most recent): Lab Results  Component Value Date   HGBA1C 9.2 (A) 08/03/2019   HGBA1C 8.4 (A) 05/04/2019   HGBA1C 8.8 (H) 01/18/2019   Lipid Panel     Component Value Date/Time   CHOL 184 07/28/2019 1018   TRIG 81 07/28/2019 1018   HDL 52 07/28/2019 1018   LDLCALC 117 (H) 07/28/2019 1018     Assessment & Plan:   1. 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 These are all discussed in detail with the patient.   She reports significant improvement in her glycemic profile over the last 7 days: Fasting 87, 141, 182, 148, 97, 194, 139, 128 Prelunch 102, 119, 129, 203, 191, 242, 111, 142 Pre-supper 119, 182, 146, 73, 139, 113, 156, 165 Bedtime 203, 191, 129, 182, 141, 125, 138, 159, 140 She denies any major hypoglycemia,  her recent point-of-care A1c was 9.2% increasing from 8.4%.    Recent labs reviewed, showing normal renal function.  - I have re-counseled the patient on diet management  by adopting a carbohydrate restricted / protein rich  Diet. -  Suggestion is made for her to avoid simple carbohydrates  from her diet including Cakes, Sweet Desserts / Pastries, Ice Cream, Soda (diet and regular), Sweet Tea, Candies, Chips, Cookies, Sweet Pastries,  Store Bought Juices, Alcohol in Excess of  1-2 drinks a day, Artificial Sweeteners, Coffee Creamer, and "Sugar-free" Products. This will help patient to have stable blood glucose profile and potentially avoid unintended weight gain.  - Patient is advised to stick to a routine mealtimes to eat 3 meals  a day and avoid unnecessary snacks ( to snack only to correct hypoglycemia).  - I have approached patient with the following individualized plan to manage diabetes and patient agrees.  -She continues to tolerate Januvia, advised to continue Januvia 100 mg p.o. daily at breakfast.    -She wants to achieve control of diabetes before her carpal tunnel surgery.   -She is advised to continue Lantus at 80  units nightly,   associated with strict monitoring of blood glucose 2-4 times a day-daily . - -She is encouraged to call clinic for fasting blood glucose readings  below 70 or above 200 mg per DL. -She is benefiting from low-dose glipizide.  She is advised to continue glipizide 5 mg XL p.o. daily at breakfast.    -She did not tolerate metformin. 2) BP/HTN: She has well-controlled blood pressure.. She did have previous history of hypertension which reportedly has improved with weight loss.  She is approached for treatment.  Agrees to start Diovan 40 mg p.o. daily at breakfast.   3) Lipids/HPL: Her recent lipid panel showed total LDL at 117, increasing from 70.  She was recently initiated on Crestor 5 mg p.o. nightly. Side effects and precautions discussed with her.     4) Chronic Care/Health Maintenance:  -Patient is  encouraged to continue to follow up with Ophthalmology, Podiatrist at least yearly or according to recommendations, and advised to quit smoking. I have recommended yearly flu vaccine and pneumonia vaccination at least every 5 years; moderate intensity exercise for up to 150 minutes weekly; and  sleep for at least 7 hours a day.  - I advised patient to maintain close follow up with [name], MD for her primary care needs. - Time spent on this patient care encounter:  35 min, of which >50% was spent in  counseling and the rest reviewing her  current and  previous labs/studies ( including abstraction from other facilities),  previous treatments, her blood glucose readings, and medications' doses and developing a plan for long-term care based on the latest recommendations for standards of care; and documenting her care.  Stephens Shire participated in the discussions, expressed understanding, and voiced agreement with the above plans.  All questions were answered to her satisfaction. she is encouraged to contact clinic  should she have any questions or concerns prior to her return visit.   Follow up plan: -Return in about 9 weeks (around 10/18/2019) for Bring Meter and Logs- A1c in Office.   Glade Lloyd, MD Phone: [phone]  Fax:  [phone]  -  This note was partially dictated with voice recognition software. Similar sounding words can be transcribed inadequately or may not  be corrected upon review.  08/16/2019, 8:51 AM

## 2019-10-11 ENCOUNTER — Other Ambulatory Visit: Payer: Self-pay | Admitting: "Endocrinology

## 2019-10-11 DIAGNOSIS — Z794 Long term (current) use of insulin: Secondary | ICD-10-CM

## 2019-10-19 ENCOUNTER — Ambulatory Visit: Payer: Medicaid Other | Admitting: "Endocrinology

## 2019-11-01 ENCOUNTER — Other Ambulatory Visit: Payer: Self-pay | Admitting: "Endocrinology

## 2019-11-14 ENCOUNTER — Ambulatory Visit: Payer: Medicaid Other | Admitting: "Endocrinology

## 2019-12-13 ENCOUNTER — Telehealth: Payer: Self-pay | Admitting: "Endocrinology

## 2019-12-13 ENCOUNTER — Other Ambulatory Visit: Payer: Self-pay | Admitting: "Endocrinology

## 2019-12-13 DIAGNOSIS — E11311 Type 2 diabetes mellitus with unspecified diabetic retinopathy with macular edema: Secondary | ICD-10-CM

## 2019-12-13 NOTE — Telephone Encounter (Signed)
Sent in

## 2019-12-13 NOTE — Telephone Encounter (Signed)
Pt called and states she is needing a refill on the following medication. She has been out for 2 days.  JANUVIA 100 MG tablet     [place] [place] [facility] [place] Phone:  [phone]  Fax:  [phone]

## 2019-12-22 ENCOUNTER — Ambulatory Visit (INDEPENDENT_AMBULATORY_CARE_PROVIDER_SITE_OTHER): Payer: Medicaid Other | Admitting: Nurse Practitioner

## 2019-12-22 ENCOUNTER — Encounter: Payer: Self-pay | Admitting: Nurse Practitioner

## 2019-12-22 ENCOUNTER — Other Ambulatory Visit: Payer: Self-pay

## 2019-12-22 VITALS — BP 155/94 | HR 92 | Ht 63.0 in | Wt 148.6 lb

## 2019-12-22 DIAGNOSIS — E782 Mixed hyperlipidemia: Secondary | ICD-10-CM

## 2019-12-22 DIAGNOSIS — I10 Essential (primary) hypertension: Secondary | ICD-10-CM | POA: Diagnosis not present

## 2019-12-22 DIAGNOSIS — E11311 Type 2 diabetes mellitus with unspecified diabetic retinopathy with macular edema: Secondary | ICD-10-CM

## 2019-12-22 DIAGNOSIS — Z794 Long term (current) use of insulin: Secondary | ICD-10-CM

## 2019-12-22 LAB — POCT GLYCOSYLATED HEMOGLOBIN (HGB A1C): Hemoglobin A1C: 7.6 % — AB (ref 4.0–5.6)

## 2019-12-22 LAB — POCT UA - MICROALBUMIN
Albumin/Creatinine Ratio, Urine, POC: <30
Creatinine, POC: 100 mg/dL
Microalbumin Ur, POC: 10 mg/L

## 2019-12-22 NOTE — Patient Instructions (Addendum)

## 2019-12-22 NOTE — Progress Notes (Signed)
12/22/2019   Endocrinology Follow Up Visit   Subjective:    Patient ID: Allison [name], female    DOB: 23-May-1971, PCP [name], MD  Patient presents today for follow up and management of type 2 diabetes, hyperlipidemia, and hypertension. Her PCP is De. [name].  Past Medical History:  Diagnosis Date  . Brain cyst    x2  . Cancer (HCC)    cervical   . Dyslipidemia   . Gastroparesis    possible  . Glaucoma   . Hypertension   . Insulin dependent diabetes mellitus   . Neuropathy   . Retinopathy   . 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 ABDOMINAL HYSTERECTOMY     partial   . EYE SURGERY     multiple  . RADIOLOGY WITH ANESTHESIA N/A 07/07/2017   Procedure: MRI WITH ANESTHESIA,BRAIN WITHOUT CONTRAST;  Surgeon: Radiologist, Medication, MD;  Location: [facility];  Service: Radiology;  Laterality: N/A;   Social History   Socioeconomic History  . Marital status: Divorced    Spouse name: Not on file  . Number of children: Not on file  . Years of education: Not on file  . Highest education level: Not on file  Occupational History  . Not on file  Tobacco Use  . Smoking status: Current Some Day Smoker    Packs/day: 0.25    Years: 26.00    Pack years: 6.50    Types: Cigarettes    Last attempt to quit: 05/05/2014    Years since quitting: 5.6  . Smokeless tobacco: Never Used  Vaping Use  . Vaping Use: Never used  Substance and Sexual Activity  . Alcohol use: No  . Drug use: No  . Sexual activity: Yes    Birth control/protection: Surgical  Other Topics Concern  . Not on file  Social History Narrative  . Not on file   Social Determinants of Health   Financial Resource Strain:   . Difficulty of Paying Living Expenses:   Food Insecurity:   . Worried About Charity fundraiser in the Last Year:   . Arboriculturist in the Last Year:   Transportation Needs:   . Film/video editor (Medical):   [name]  Lack of Transportation (Non-Medical):   Physical Activity:   . Days of Exercise per Week:   . Minutes of Exercise per Session:   Stress:   . Feeling of Stress :   Social Connections:   . Frequency of Communication with Friends and Family:   . Frequency of Social Gatherings with Friends and Family:   . Attends Religious Services:   . Active Member of Clubs or Organizations:   . Attends Archivist Meetings:   [name] Marital Status:    Outpatient Encounter Medications as of 12/22/2019  Medication Sig  . albuterol (PROAIR HFA) 108 (90 BASE) MCG/ACT inhaler Inhale 2 puffs into the lungs every 4 (four) hours as needed for wheezing or shortness of breath.  . ALPHAGAN P 0.1 % SOLN Place 1 drop into both eyes 2 (two) times daily.  [name] ALPRAZolam (XANAX) 0.5 MG tablet Take 0.5 mg by mouth 4 (four) times daily as needed for anxiety.   [name] amphetamine-dextroamphetamine (ADDERALL) 10 MG tablet Take 20 mg by mouth 2 (two) times daily with a meal.   . aspirin EC 81 MG tablet Take 81 mg by mouth daily.  . blood glucose meter  kit and supplies KIT Dispense based on patient and insurance preference. Use up to four times daily as directed. (FOR ICD 10 E11.65). (Patient not taking: Reported on 06/22/2018)  . furosemide (LASIX) 40 MG tablet ORAL TAKE 1 TABLET IF NEEDED FOR FLUID.  [name] GLIPIZIDE XL 5 MG 24 hr tablet TAKE (1) TABLET BY MOUTH EACH MORNING WITH BREAKFAST.  [name] glucose blood (ACCU-CHEK AVIVA PLUS) test strip USE AS DIRECTED UP TO FOUR TIMES DAILY.  [name] HYDROcodone-acetaminophen (NORCO) 7.5-325 MG tablet Take 1 tablet by mouth every 4 (four) hours as needed for severe pain.  [name] insulin glargine (LANTUS SOLOSTAR) 100 UNIT/ML Solostar Pen 80 units qhs  . Insulin Pen Needle (B-D ULTRAFINE III SHORT PEN) 31G X 8 MM MISC 1 each by Does not apply route as directed. (Patient not taking: Reported on 06/22/2018)  . JANUVIA 100 MG tablet TAKE ONE TABLET BY MOUTH ONCE DAILY.  [name] loratadine (CLARITIN) 10 MG tablet Take  10 mg by mouth daily.  . Melatonin 5 MG TABS Take 1 tablet by mouth at bedtime.  . moxifloxacin (VIGAMOX) 0.5 % ophthalmic solution Place 1 drop into both eyes See admin instructions. Uses when having eye injections (every 6 weeks)  . Multiple Vitamin (MULTIVITAMIN) tablet Take 2 tablets by mouth daily.   [name] omeprazole (PRILOSEC) 20 MG capsule Take 20 mg by mouth 2 (two) times daily as needed.   . phenylephrine (SUDAFED PE) 10 MG TABS tablet Take 10 mg by mouth every 4 (four) hours as needed.  . polyethylene glycol (MIRALAX / GLYCOLAX) packet Take 17 g by mouth daily.  . potassium chloride SA (K-DUR,KLOR-CON) 20 MEQ tablet Take 20 mEq by mouth 2 (two) times a week.   . rosuvastatin (CRESTOR) 5 MG tablet TAKE ONE TABLET BY MOUTH ONCE DAILY.   Facility-Administered Encounter Medications as of 12/22/2019  Medication  . triamcinolone acetonide (TRIESENCE) 40 MG/ML subtenons injection 4 mg  . triamcinolone acetonide (TRIESENCE) 40 MG/ML subtenons injection 4 mg   ALLERGIES: Allergies  Allergen Reactions  . Cymbalta [Duloxetine Hcl] Shortness Of Breath  . Beta Adrenergic Blockers Palpitations  . Codeine Itching, Dermatitis and Other (See Comments)    TYLENOL #3   Welts and itching  . Lexapro [Escitalopram Oxalate] Hives and Palpitations  . Other Palpitations    Allergic to all beta blocker eye drops!!  . Victoza [Liraglutide] Nausea And Vomiting  . Doxycycline Other (See Comments)    UNSPECIFIED Non-specific Heart problems  . Lyrica [Pregabalin] Palpitations  . Oxycodone Itching   VACCINATION STATUS:  There is no immunization history on file for this patient.  Diabetes She presents for her follow-up diabetic visit. She has type 2 diabetes mellitus. Onset time: She was diagnosed at approximate age of 87 years. Her disease course has been improving. There are no hypoglycemic associated symptoms. Pertinent negatives for hypoglycemia include no confusion, headaches, pallor or tremors.  Pertinent negatives for diabetes include no chest pain, no polydipsia, no polyphagia and no polyuria. There are no hypoglycemic complications. Symptoms are improving. Diabetic complications include peripheral neuropathy and retinopathy. Risk factors for coronary artery disease include diabetes mellitus, dyslipidemia, hypertension, tobacco exposure and sedentary lifestyle. Current diabetic treatment includes insulin injections and oral agent (dual therapy). She is compliant with treatment most of the time. Her weight is fluctuating minimally. She is following a generally unhealthy diet. When asked about meal planning, she reported none. She never participates in exercise. Her home blood glucose trend is decreasing steadily. Her breakfast blood glucose range  is generally 140-180 mg/dl. Her overall blood glucose range is 140-180 mg/dl. (She presents today with her meter, showing slightly above target fasting and postprandial glycemic profile.  Her point-of-care A1c today was 7.6%, improving from last visit at 9.2%.  There are no documented or reported hypoglycemic episodes.   ) An ACE inhibitor/angiotensin II receptor blocker is not being taken. She does not see a podiatrist.Eye exam is current.  Hyperlipidemia This is a chronic problem. The current episode started more than 1 year ago. The problem is controlled. Recent lipid tests were reviewed and are high. Exacerbating diseases include diabetes. Factors aggravating her hyperlipidemia include fatty foods and smoking. Pertinent negatives include no chest pain, myalgias or shortness of breath. Current antihyperlipidemic treatment includes statins. Risk factors for coronary artery disease include diabetes mellitus, dyslipidemia, hypertension and a sedentary lifestyle.  Hypertension This is a chronic problem. The current episode started more than 1 year ago. The problem is uncontrolled. Pertinent negatives include no chest pain, headaches, palpitations or  shortness of breath. Risk factors for coronary artery disease include dyslipidemia, diabetes mellitus, obesity, sedentary lifestyle, smoking/tobacco exposure and stress. Past treatments include nothing. Hypertensive end-organ damage includes retinopathy.   Review of systems: Limited as above.  Review of Systems  Respiratory: Negative for shortness of breath.   Cardiovascular: Negative for chest pain, palpitations and leg swelling.  Musculoskeletal: Negative for myalgias.       Reports she has carpal tunnel in bilateral wrists, and trigger finger bilaterally.  Skin: Negative for pallor.  Neurological: Negative for tremors and headaches.  Endo/Heme/Allergies: Negative for polydipsia and polyphagia.  Psychiatric/Behavioral: Negative for confusion. The patient has insomnia.      Objective:    BP (!) 155/94 (BP Location: Right Arm, Patient Position: Sitting)   Pulse 92   Ht _0  (1.6 m)   Wt 148 lb 9.6 oz (67.4 kg)   BMI 26.32 kg/m   Wt Readings from Last 3 Encounters:  12/22/19 148 lb 9.6 oz (67.4 kg)  08/03/19 148 lb (67.1 kg)  05/04/19 155 lb (70.3 kg)    Vitals with BMI 12/22/2019 08/03/2019 05/04/2019  Height _1  _2  _3   Weight 148 lbs 10 oz 148 lbs 155 lbs  BMI 26.33 36.12 24.49  Systolic 753 005 110  Diastolic 94 84 85  Pulse 92 90 86    Physical Exam- Limited  Constitutional:  Body mass index is 26.32 kg/m. , not in acute distress, normal state of mind Eyes:  EOMI, no exophthalmos Neck: Supple Thyroid: No gross goiter Cardiovascular: RRR, no murmers, rubs, or gallops, no edema Respiratory: Adequate breathing efforts, no crackles, rales, rhonchi, or wheezing Musculoskeletal: no gross deformities, strength intact in all four extremities, no gross restriction of joint movements Skin:  no rashes, no hyperemia Neurological: no tremor with outstretched hands  Foot exam:   No rashes, ulcers, cuts, calluses, onychodystrophy.   Good pulses bilat.  Good  sensation to 10 g monofilament bilat.    Results for orders placed or performed in visit on 08/03/19  HgB A1c  Result Value Ref Range   Hemoglobin A1C 9.2 (A) 4.0 - 5.6 %   HbA1c POC (<> result, manual entry)     HbA1c, POC (prediabetic range)     HbA1c, POC (controlled diabetic range)     Diabetic Labs (most recent): Lab Results  Component Value Date   HGBA1C 9.2 (A) 08/03/2019   HGBA1C 8.4 (A) 05/04/2019   HGBA1C 8.8 (H) 01/18/2019   Lipid  Panel     Component Value Date/Time   CHOL 184 07/28/2019 1018   TRIG 81 07/28/2019 1018   HDL 52 07/28/2019 1018   LDLCALC 117 (H) 07/28/2019 1018     Assessment & Plan:   1. 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  Her point-of-care A1c today was 7.6%, improving from last visit at 9.2%.  There are no documented or reported hypoglycemic episodes.  -Her diabetes is  complicated by advanced retinopathy and patient remains at a high risk for more acute and chronic complications of diabetes which include CAD, CVA, CKD, retinopathy, and neuropathy. These are all discussed in detail with the patient.    Recent labs from 07/2019 reviewed, showing normal renal function.  Urine microalbumin performed today in office with adequate results.  - The patient admits there is a room for improvement in their diet and drink choices. -  Suggestion is made for the patient to avoid simple carbohydrates from their diet including Cakes, Sweet Desserts / Pastries, Ice Cream, Soda (diet and regular), Sweet Tea, Candies, Chips, Cookies, Sweet Pastries,  Store Bought Juices, Alcohol in Excess of  1-2 drinks a day, Artificial Sweeteners, Coffee Creamer, and "Sugar-free" Products. This will help patient to have stable blood glucose profile  and potentially avoid unintended weight gain.   - I encouraged the patient to switch to  unprocessed or minimally processed complex starch and increased protein intake (animal or plant source), fruits, and vegetables.   - Patient is advised to stick to a routine mealtimes to eat 3 meals  a day and avoid unnecessary snacks ( to snack only to correct hypoglycemia).  - I have approached patient with the following individualized plan to manage diabetes and patient agrees.  -Continues to tolerate and benefit from [place].  She is advised to continue Januvia 100 mg p.o. daily at breakfast.  She is also advised to continue glipizide 5 mg XL daily with breakfast and Lantus 80 units SQ nightly.    -She has achieved control of her diabetes well enough for her to continue with carpal tunnel surgery.  - She is encouraged to call clinic for fasting blood glucose readings below 70 or above 200 mg per DL.  -She did not tolerate metformin previously.   2) BP/HTN:  Her blood pressure is not controlled to target.  She does not take any maintenance antihypertensive medication.  She does have Lasix 40 mg p.o. daily as needed for fluid.  She has tried multiple different antihypertensives in the past and wishes to stay off of them for now.  She does not check her blood pressure at home.  Prescription written for digital blood pressure monitor, so patient can start monitoring blood pressure at home.  She is advised to call clinic if blood pressures are consistently above 140/90.  Will consider adding low-dose lisinopril if blood pressure continues to be elevated on subsequent visits.  Patient in agreement.  3) Lipids/HPL:  Her recent lipid panel from 07/2019, shows LDL at 117, not controlled to target.  She was initiated on Crestor 5 mg p.o. nightly at last visit.  She denies any side effects from this medication. Will recheck lipid profile when appropriate and adjust dose if necessary.  4) Chronic Care/Health  Maintenance:  -Patient is  encouraged to continue to follow  up with Ophthalmology, Podiatrist at least yearly or according to recommendations, and advised to quit smoking. I have recommended yearly flu vaccine and pneumonia vaccination at least every 5 years; moderate intensity exercise for up to 150 minutes weekly; and  sleep for at least 7 hours a day.  - I advised patient to maintain close follow up with [name], MD for her primary care needs. - Time spent on this patient care encounter:  35 min, of which > 50% was spent in  counseling and the rest reviewing her blood glucose logs , discussing her hypoglycemia and hyperglycemia episodes, reviewing her current and  previous labs / studies  ( including abstraction from other facilities) and medications  doses and developing a  long term treatment plan and documenting her care.   Please refer to Patient Instructions for Blood Glucose Monitoring and Insulin/Medications Dosing Guide"  in media tab for additional information. Please  also refer to " Patient Self Inventory" in the Media  tab for reviewed elements of pertinent patient history.  Stephens Shire participated in the discussions, expressed understanding, and voiced agreement with the above plans.  All questions were answered to her satisfaction. she is encouraged to contact clinic should she have any questions or concerns prior to her return visit.   Follow up plan: -Return for Diabetes follow up with A1c in office.   Rayetta Pigg, FNP-BC [facility] Phone: [phone] Fax: [phone]  12/22/2019, 9:36 AM

## 2019-12-28 ENCOUNTER — Other Ambulatory Visit (HOSPITAL_COMMUNITY): Payer: Self-pay | Admitting: Nurse Practitioner

## 2019-12-28 DIAGNOSIS — Z1231 Encounter for screening mammogram for malignant neoplasm of breast: Secondary | ICD-10-CM

## 2020-01-10 ENCOUNTER — Ambulatory Visit (HOSPITAL_COMMUNITY): Payer: Medicaid Other

## 2020-01-11 ENCOUNTER — Other Ambulatory Visit: Payer: Self-pay | Admitting: "Endocrinology

## 2020-01-11 DIAGNOSIS — E11311 Type 2 diabetes mellitus with unspecified diabetic retinopathy with macular edema: Secondary | ICD-10-CM

## 2020-01-11 DIAGNOSIS — Z794 Long term (current) use of insulin: Secondary | ICD-10-CM

## 2020-01-19 ENCOUNTER — Other Ambulatory Visit: Payer: Self-pay | Admitting: Nurse Practitioner

## 2020-04-10 ENCOUNTER — Other Ambulatory Visit: Payer: Self-pay | Admitting: "Endocrinology

## 2020-04-10 DIAGNOSIS — E11311 Type 2 diabetes mellitus with unspecified diabetic retinopathy with macular edema: Secondary | ICD-10-CM

## 2020-04-26 ENCOUNTER — Ambulatory Visit: Payer: Medicaid Other | Admitting: Nurse Practitioner

## 2020-05-02 ENCOUNTER — Encounter: Payer: Self-pay | Admitting: Nurse Practitioner

## 2020-05-02 ENCOUNTER — Other Ambulatory Visit: Payer: Self-pay

## 2020-05-02 ENCOUNTER — Ambulatory Visit (INDEPENDENT_AMBULATORY_CARE_PROVIDER_SITE_OTHER): Payer: Medicaid Other | Admitting: Nurse Practitioner

## 2020-05-02 VITALS — BP 168/93 | HR 80 | Ht 63.0 in | Wt 154.0 lb

## 2020-05-02 DIAGNOSIS — I10 Essential (primary) hypertension: Secondary | ICD-10-CM | POA: Diagnosis not present

## 2020-05-02 DIAGNOSIS — E11311 Type 2 diabetes mellitus with unspecified diabetic retinopathy with macular edema: Secondary | ICD-10-CM

## 2020-05-02 DIAGNOSIS — Z794 Long term (current) use of insulin: Secondary | ICD-10-CM

## 2020-05-02 DIAGNOSIS — E782 Mixed hyperlipidemia: Secondary | ICD-10-CM

## 2020-05-02 LAB — POCT UA - MICROALBUMIN: Microalbumin Ur, POC: 10 mg/L

## 2020-05-02 LAB — POCT GLYCOSYLATED HEMOGLOBIN (HGB A1C): HbA1c, POC (controlled diabetic range): 8.3 % — AB (ref 0.0–7.0)

## 2020-05-02 MED ORDER — LISINOPRIL 5 MG PO TABS: 5.0000 mg | ORAL_TABLET | Freq: Every day | ORAL | 3 refills | Status: DC

## 2020-05-02 NOTE — Patient Instructions (Signed)

## 2020-05-02 NOTE — Progress Notes (Addendum)
05/02/2020   Endocrinology Follow Up Visit   Subjective:    Patient ID: Allison [name], female    DOB: May 13, 1971, PCP [name], MD  Patient presents today for follow up and management of type 2 diabetes, hyperlipidemia, and hypertension. Her PCP is De. [name].  Past Medical History:  Diagnosis Date  . Brain cyst    x2  . Cancer (HCC)    cervical   . Dyslipidemia   . Gastroparesis    possible  . Glaucoma   . Hypertension   . Insulin dependent diabetes mellitus   . Neuropathy   . Retinopathy   . 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 ABDOMINAL HYSTERECTOMY     partial   . EYE SURGERY     multiple  . RADIOLOGY WITH ANESTHESIA N/A 07/07/2017   Procedure: MRI WITH ANESTHESIA,BRAIN WITHOUT CONTRAST;  Surgeon: Radiologist, Medication, MD;  Location: [facility];  Service: Radiology;  Laterality: N/A;   Social History   Socioeconomic History  . Marital status: Divorced    Spouse name: Not on file  . Number of children: Not on file  . Years of education: Not on file  . Highest education level: Not on file  Occupational History  . Not on file  Tobacco Use  . Smoking status: Current Some Day Smoker    Packs/day: 0.25    Years: 26.00    Pack years: 6.50    Types: Cigarettes    Last attempt to quit: 05/05/2014    Years since quitting: 5.9  . Smokeless tobacco: Never Used  Vaping Use  . Vaping Use: Never used  Substance and Sexual Activity  . Alcohol use: No  . Drug use: No  . Sexual activity: Yes    Birth control/protection: Surgical  Other Topics Concern  . Not on file  Social History Narrative  . Not on file   Social Determinants of Health   Financial Resource Strain:   . Difficulty of Paying Living Expenses: Not on file  Food Insecurity:   . Worried About Charity fundraiser in the Last Year: Not on file  . Ran Out of Food in the Last Year: Not on file  Transportation Needs:   . Lack of  Transportation (Medical): Not on file  . Lack of Transportation (Non-Medical): Not on file  Physical Activity:   . Days of Exercise per Week: Not on file  . Minutes of Exercise per Session: Not on file  Stress:   . Feeling of Stress : Not on file  Social Connections:   . Frequency of Communication with Friends and Family: Not on file  . Frequency of Social Gatherings with Friends and Family: Not on file  . Attends Religious Services: Not on file  . Active Member of Clubs or Organizations: Not on file  . Attends Archivist Meetings: Not on file  . Marital Status: Not on file   Outpatient Encounter Medications as of 05/02/2020  Medication Sig  . albuterol (PROAIR HFA) 108 (90 BASE) MCG/ACT inhaler Inhale 2 puffs into the lungs every 4 (four) hours as needed for wheezing or shortness of breath.  . ALPHAGAN P 0.1 % SOLN Place 1 drop into both eyes every 8 (eight) hours.   . ALPRAZolam (XANAX) 1 MG tablet Take 1 mg by mouth 3 (three) times daily as needed.  [name] amphetamine-dextroamphetamine (ADDERALL) 20 MG tablet Take 20 mg by mouth  3 (three) times daily.  [name] aspirin EC 81 MG tablet Take 81 mg by mouth daily.  [name] azelastine (ASTELIN) 0.1 % nasal spray Place 2 sprays into both nostrils 2 (two) times daily.  . blood glucose meter kit and supplies KIT Dispense based on patient and insurance preference. Use up to four times daily as directed. (FOR ICD 10 E11.65).  [name] conjugated estrogens (PREMARIN) vaginal cream Premarin 0.625 mg/gram vaginal cream  PLACE A FOURTH AN APPLICATORFUL APPLICATORFUL VAGINALLY 3 TIMES A WEEK.  . dorzolamide (TRUSOPT) 2 % ophthalmic solution dorzolamide 2 % eye drops  INSTILL 1 DROP INTO BOTH EYES 3 TIMES A DAY  . furosemide (LASIX) 40 MG tablet ORAL TAKE 1 TABLET IF NEEDED FOR FLUID.  [name] GLIPIZIDE XL 5 MG 24 hr tablet TAKE (1) TABLET BY MOUTH EACH MORNING WITH BREAKFAST.  [name] glucose blood (ACCU-CHEK AVIVA PLUS) test strip USE AS DIRECTED UP TO FOUR TIMES DAILY.  [name]  HYDROcodone-acetaminophen (NORCO) 7.5-325 MG tablet Take 1 tablet by mouth every 4 (four) hours as needed for severe pain.  [name] ibuprofen (ADVIL) 800 MG tablet ibuprofen 800 mg tablet  TAKE 1 TABLET BY MOUTH EVERY 8 HOURS AS NEEDED  . insulin glargine (LANTUS SOLOSTAR) 100 UNIT/ML Solostar Pen 80 units qhs (Patient taking differently: 54 Units. 80 units qhs)  . Insulin Pen Needle (B-D ULTRAFINE III SHORT PEN) 31G X 8 MM MISC 1 each by Does not apply route as directed.  [name] JANUVIA 100 MG tablet TAKE ONE TABLET BY MOUTH ONCE DAILY.  [name] loratadine (CLARITIN) 10 MG tablet Take 10 mg by mouth daily.  . Melatonin 5 MG TABS Take 1 tablet by mouth at bedtime.  . Multiple Vitamin (MULTIVITAMIN) tablet Take 2 tablets by mouth daily.   [name] ofloxacin (OCUFLOX) 0.3 % ophthalmic solution ofloxacin 0.3 % eye drops  INSTILL 1 DROP INTO LEFT EYE 3 TIMES A DAY  . omeprazole (PRILOSEC) 20 MG capsule Take 20 mg by mouth 2 (two) times daily as needed.   . phenylephrine (SUDAFED PE) 10 MG TABS tablet Take 10 mg by mouth every 4 (four) hours as needed.  . polyethylene glycol (MIRALAX / GLYCOLAX) packet Take 17 g by mouth daily.  . potassium chloride SA (K-DUR,KLOR-CON) 20 MEQ tablet Take 20 mEq by mouth 2 (two) times a week.   . rosuvastatin (CRESTOR) 5 MG tablet TAKE ONE TABLET BY MOUTH ONCE DAILY.  . [DISCONTINUED] ALPRAZolam (XANAX) 0.5 MG tablet Take 0.5 mg by mouth 4 (four) times daily as needed for anxiety.   . [DISCONTINUED] amphetamine-dextroamphetamine (ADDERALL) 10 MG tablet Take 20 mg by mouth 3 (three) times daily.   . [DISCONTINUED] moxifloxacin (VIGAMOX) 0.5 % ophthalmic solution Place 1 drop into both eyes See admin instructions. Uses when having eye injections (every 6 weeks)  . lisinopril (ZESTRIL) 5 MG tablet Take 1 tablet (5 mg total) by mouth daily.   Facility-Administered Encounter Medications as of 05/02/2020  Medication  . triamcinolone acetonide (TRIESENCE) 40 MG/ML subtenons injection 4 mg  .  triamcinolone acetonide (TRIESENCE) 40 MG/ML subtenons injection 4 mg   ALLERGIES: Allergies  Allergen Reactions  . Cymbalta [Duloxetine Hcl] Shortness Of Breath  . Beta Adrenergic Blockers Palpitations  . Codeine Itching, Dermatitis and Other (See Comments)    TYLENOL #3   Welts and itching  . Lexapro [Escitalopram Oxalate] Hives and Palpitations  . Other Palpitations    Allergic to all beta blocker eye drops!!  . Victoza [Liraglutide] Nausea And Vomiting  . Doxycycline  Other (See Comments)    UNSPECIFIED Non-specific Heart problems  . Lyrica [Pregabalin] Palpitations  . Oxycodone Itching   VACCINATION STATUS:  There is no immunization history on file for this patient.  Diabetes She presents for her follow-up diabetic visit. She has type 2 diabetes mellitus. Onset time: She was diagnosed at approximate age of 26 years. Her disease course has been worsening. Hypoglycemia symptoms include nervousness/anxiousness, sweats and tremors. Pertinent negatives for hypoglycemia include no confusion, headaches or pallor. Pertinent negatives for diabetes include no chest pain, no polydipsia, no polyphagia and no polyuria. There are no hypoglycemic complications. Symptoms are stable. Diabetic complications include peripheral neuropathy and retinopathy. Risk factors for coronary artery disease include diabetes mellitus, dyslipidemia, hypertension, tobacco exposure and sedentary lifestyle. Current diabetic treatment includes insulin injections and oral agent (dual therapy). She is compliant with treatment most of the time. Her weight is increasing steadily. She is following a generally unhealthy diet. When asked about meal planning, she reported none. She has not had a previous visit with a dietitian. She never participates in exercise. Her home blood glucose trend is fluctuating dramatically. (She presents today with her meter, no logs, showing widely fluctuating glycemic profile with some mild random  hypoglycemia.  Her POCT A1c today is 8.3%, increasing from last visit of 7.6%.  She reports she has only been taking 20-80 units of Lantus at night depending on what her bedtime glucose reading is.  She has had carpal tunnel surgery on left hand, waiting on right hand.) An ACE inhibitor/angiotensin II receptor blocker is not being taken. She does not see a podiatrist.Eye exam is current.  Hyperlipidemia This is a chronic problem. The current episode started more than 1 year ago. The problem is controlled. Recent lipid tests were reviewed and are high. Exacerbating diseases include chronic renal disease and diabetes. Factors aggravating her hyperlipidemia include fatty foods and smoking. Pertinent negatives include no chest pain, myalgias or shortness of breath. Current antihyperlipidemic treatment includes statins. The current treatment provides mild improvement of lipids. There are no compliance problems.  Risk factors for coronary artery disease include diabetes mellitus, dyslipidemia, hypertension and a sedentary lifestyle.  Hypertension This is a chronic problem. The current episode started more than 1 year ago. The problem has been waxing and waning since onset. The problem is uncontrolled. Associated symptoms include sweats. Pertinent negatives include no chest pain, headaches, palpitations or shortness of breath. There are no associated agents to hypertension. Risk factors for coronary artery disease include dyslipidemia, diabetes mellitus, obesity, sedentary lifestyle, smoking/tobacco exposure and stress. Past treatments include diuretics (Lasix as needed for swelling). The current treatment provides no improvement. Compliance problems include diet and exercise.  Hypertensive end-organ damage includes kidney disease and retinopathy. Identifiable causes of hypertension include chronic renal disease.    Review of systems  Constitutional: + Minimally fluctuating body weight,  current Body mass index is  27.28 kg/m. , no fatigue, no subjective hyperthermia, no subjective hypothermia Eyes: no blurry vision, no xerophthalmia ENT: no sore throat, no nodules palpated in throat, no dysphagia/odynophagia, no hoarseness Cardiovascular: no chest pain, no shortness of breath, no palpitations, no leg swelling (reports mild swelling to hands) Respiratory: no cough, no shortness of breath Gastrointestinal: no nausea/vomiting/diarrhea Musculoskeletal: no muscle/joint aches except for trigger finger in bilateral hands. Skin: no rashes, no hyperemia Neurological: no tremors, + numbness, + tingling in bilateral hands and feet, no dizziness Psychiatric: no depression, no anxiety   Objective:    BP (!) 168/93  Pulse 80   Ht _0  (1.6 m)   Wt 154 lb (69.9 kg)   BMI 27.28 kg/m   Wt Readings from Last 3 Encounters:  05/02/20 154 lb (69.9 kg)  12/22/19 148 lb 9.6 oz (67.4 kg)  08/03/19 148 lb (67.1 kg)    BP Readings from Last 3 Encounters:  05/02/20 (!) 168/93  12/22/19 (!) 155/94  08/03/19 134/84     Physical Exam- Limited  Constitutional:  Body mass index is 27.28 kg/m. , not in acute distress, normal state of mind Eyes:  EOMI, no exophthalmos Neck: Supple Thyroid: No gross goiter Cardiovascular: RRR, no murmers, rubs, or gallops, + nonpitting edema to BUE Respiratory: Adequate breathing efforts, no crackles, rales, rhonchi, or wheezing Musculoskeletal: no gross deformities, strength intact in all four extremities, no gross restriction of joint movements Skin:  no rashes, no hyperemia Neurological: no tremor with outstretched hands   POCT ABI Results 05/02/20   Right ABI:  1.14      Left ABI:  1.15  Right leg systolic / diastolic: 643/329 mmHg Left leg systolic / diastolic: 518/841 mmHg  Arm systolic / diastolic: 660/63 mmHG  Detailed report will be scanned into patient chart.    Results for orders placed or performed in visit on 05/02/20  HgB A1c  Result Value Ref Range    Hemoglobin A1C     HbA1c POC (<> result, manual entry)     HbA1c, POC (prediabetic range)     HbA1c, POC (controlled diabetic range) 8.3 (A) 0.0 - 7.0 %  POCT UA - Microalbumin  Result Value Ref Range   Microalbumin Ur, POC 10 mg/L   Creatinine, POC     Albumin/Creatinine Ratio, Urine, POC     Diabetic Labs (most recent): Lab Results  Component Value Date   HGBA1C 8.3 (A) 05/02/2020   HGBA1C 7.6 (A) 12/22/2019   HGBA1C 9.2 (A) 08/03/2019   Lipid Panel     Component Value Date/Time   CHOL 184 07/28/2019 1018   TRIG 81 07/28/2019 1018   HDL 52 07/28/2019 1018   LDLCALC 117 (H) 07/28/2019 1018     Assessment & Plan:   1) 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  Her POCT A1c today is 8.3%, increasing from last visit of 7.6%.  She reports she has only been taking 20-80 units of Lantus at night depending on what her bedtime glucose reading is.  She has had carpal tunnel surgery on left hand, waiting on right hand.    -Her diabetes is complicated by advanced retinopathy and patient remains at a high risk for more acute and chronic complications of diabetes which include CAD, CVA, CKD, retinopathy, and neuropathy. These are all discussed in detail with the patient.  - Nutritional counseling repeated at each appointment due to patients tendency to fall back in to old habits.  - The patient admits there is a room for improvement in their diet and drink choices. -  Suggestion is made for the patient to avoid simple carbohydrates from their diet including Cakes, Sweet Desserts / Pastries, Ice Cream, Soda (diet and regular), Sweet Tea, Candies, Chips, Cookies, Sweet Pastries,  Store Bought Juices, Alcohol in Excess of  1-2 drinks a day, Artificial  Sweeteners, Coffee Creamer, and "Sugar-free" Products. This will help  patient to have stable blood glucose profile and potentially avoid unintended weight gain.   - I encouraged the patient to switch to  unprocessed or minimally processed complex starch and increased protein intake (animal or plant source), fruits, and vegetables.   - Patient is advised to stick to a routine mealtimes to eat 3 meals  a day and avoid unnecessary snacks ( to snack only to correct hypoglycemia).  - I have approached patient with the following individualized plan to manage diabetes and patient agrees.  -Continues to tolerate and benefit from [place].  She is advised to continue Januvia 100 mg p.o. daily at breakfast.  She is also advised to continue Glipizide 5 mg XL daily with breakfast and decrease Lantus to 20 units SQ nightly (to be taken daily regardless of nighttime glucose reading).    -She is encouraged to continue monitoring blood glucose twice daily, before breakfast and before bed, and to call the clinic if she has readings less than 70 or greater than 200 for 3 tests in a row.  -She did not tolerate metformin previously.   2) BP/HTN:  Her blood pressure is controlled to target.  She is advised to continue Lasix 40 mg po daily as needed for swelling.  I discussed and initiated low dose Lisinopril 5 mg po daily.  3) Lipids/HPL:  Her recent lipid panel from 07/2019, shows uncontrolled LDL at 117.  She was initiated on Crestor 5 mg p.o. nightly at last visit.  She denies any side effects from this medication. Will recheck lipid profile prior to next visit.  4) Chronic Care/Health Maintenance: -Patient is encouraged to continue to follow up with Ophthalmology, Podiatrist at least yearly or according to recommendations, and advised to stay away from smoking (quit approximately 1 year ago). I have recommended yearly flu vaccine and pneumonia vaccination at least every 5 years; moderate intensity exercise for up  to 150 minutes weekly; and  sleep for at least 7 hours a day.  - Time spent on this patient care encounter:  35 min, of which > 50% was spent in  counseling and the rest reviewing her blood glucose logs , discussing her hypoglycemia and hyperglycemia episodes, reviewing her current and  previous labs / studies  ( including abstraction from other facilities) and medications  doses and developing a  long term treatment plan and documenting her care.   Please refer to Patient Instructions for Blood Glucose Monitoring and Insulin/Medications Dosing Guide"  in media tab for additional information. Please  also refer to " Patient Self Inventory" in the Media  tab for reviewed elements of pertinent patient history.  Stephens Shire participated in the discussions, expressed understanding, and voiced agreement with the above plans.  All questions were answered to her satisfaction. she is encouraged to contact clinic should she have any questions or concerns prior to her return visit.   Follow up plan: -Return in about 3 months (around 07/31/2020) for Diabetes follow up with A1c in office, Previsit labs, Bring glucometer and logs.   Rayetta Pigg, [facility] [address] [place],  [address] Phone: [phone] Fax: [phone]  05/02/2020, 10:02 AM

## 2020-05-08 ENCOUNTER — Other Ambulatory Visit: Payer: Self-pay | Admitting: "Endocrinology

## 2020-07-19 ENCOUNTER — Other Ambulatory Visit: Payer: Self-pay | Admitting: Nurse Practitioner

## 2020-07-19 ENCOUNTER — Other Ambulatory Visit: Payer: Self-pay | Admitting: "Endocrinology

## 2020-07-19 DIAGNOSIS — E11311 Type 2 diabetes mellitus with unspecified diabetic retinopathy with macular edema: Secondary | ICD-10-CM

## 2020-07-19 DIAGNOSIS — Z794 Long term (current) use of insulin: Secondary | ICD-10-CM

## 2020-07-25 ENCOUNTER — Telehealth: Payer: Self-pay | Admitting: "Endocrinology

## 2020-07-25 ENCOUNTER — Other Ambulatory Visit: Payer: Self-pay

## 2020-07-25 DIAGNOSIS — I10 Essential (primary) hypertension: Secondary | ICD-10-CM

## 2020-07-25 MED ORDER — FUROSEMIDE 40 MG PO TABS: ORAL_TABLET | ORAL | 2 refills | Status: AC

## 2020-07-25 NOTE — Telephone Encounter (Signed)
I am ok with refilling it.  Dr. Dorris Fetch originally prescribed it, so I am ok with continuing it.

## 2020-07-25 NOTE — Telephone Encounter (Signed)
Pt is calling and requesting a refill   furosemide (LASIX) 40 MG tablet    [id number] - [place], [place] - [address] [facility] Phone:  [phone]  Fax:  [phone]

## 2020-07-25 NOTE — Telephone Encounter (Signed)
Rx refill sent.

## 2020-07-25 NOTE — Telephone Encounter (Signed)
Please advise 

## 2020-07-31 ENCOUNTER — Ambulatory Visit: Payer: Medicaid Other | Admitting: Nurse Practitioner

## 2020-07-31 NOTE — Patient Instructions (Incomplete)

## 2020-09-18 LAB — LIPID PANEL
Chol/HDL Ratio: 2.4 ratio (ref 0.0–4.4)
Cholesterol, Total: 115 mg/dL (ref 100–199)
HDL: 48 mg/dL (ref >39)
LDL Chol Calc (NIH): 49 mg/dL (ref 0–99)
Triglycerides: 98 mg/dL (ref 0–149)
VLDL Cholesterol Cal: 18 mg/dL (ref 5–40)

## 2020-09-18 LAB — COMPREHENSIVE METABOLIC PANEL
ALT: 19 IU/L (ref 0–32)
AST: 14 IU/L (ref 0–40)
Albumin/Globulin Ratio: 1.9 (ref 1.2–2.2)
Albumin: 4.4 g/dL (ref 3.8–4.8)
Alkaline Phosphatase: 72 IU/L (ref 44–121)
BUN/Creatinine Ratio: 23 (ref 9–23)
BUN: 17 mg/dL (ref 6–24)
Bilirubin Total: <0.2 mg/dL (ref 0.0–1.2)
CO2: 18 mmol/L — ABNORMAL LOW (ref 20–29)
Calcium: 9.4 mg/dL (ref 8.7–10.2)
Chloride: 106 mmol/L (ref 96–106)
Creatinine, Ser: 0.74 mg/dL (ref 0.57–1.00)
Globulin, Total: 2.3 g/dL (ref 1.5–4.5)
Glucose: 239 mg/dL — ABNORMAL HIGH (ref 65–99)
Potassium: 4.4 mmol/L (ref 3.5–5.2)
Sodium: 137 mmol/L (ref 134–144)
Total Protein: 6.7 g/dL (ref 6.0–8.5)
eGFR: 100 mL/min/{1.73_m2} (ref >59)

## 2020-09-18 LAB — TSH: TSH: 0.786 u[IU]/mL (ref 0.450–4.500)

## 2020-09-18 LAB — VITAMIN D 25 HYDROXY (VIT D DEFICIENCY, FRACTURES): Vit D, 25-Hydroxy: 37.8 ng/mL (ref 30.0–100.0)

## 2020-09-18 LAB — T4, FREE: Free T4: 0.98 ng/dL (ref 0.82–1.77)

## 2020-09-24 ENCOUNTER — Other Ambulatory Visit: Payer: Self-pay | Admitting: Nurse Practitioner

## 2020-09-24 DIAGNOSIS — Z794 Long term (current) use of insulin: Secondary | ICD-10-CM

## 2020-09-24 DIAGNOSIS — E11311 Type 2 diabetes mellitus with unspecified diabetic retinopathy with macular edema: Secondary | ICD-10-CM

## 2020-10-19 ENCOUNTER — Other Ambulatory Visit: Payer: Self-pay | Admitting: "Endocrinology

## 2020-10-19 ENCOUNTER — Other Ambulatory Visit: Payer: Self-pay | Admitting: Nurse Practitioner

## 2020-12-03 ENCOUNTER — Telehealth: Payer: Self-pay | Admitting: Nurse Practitioner

## 2020-12-03 DIAGNOSIS — E11311 Type 2 diabetes mellitus with unspecified diabetic retinopathy with macular edema: Secondary | ICD-10-CM

## 2020-12-03 MED ORDER — SITAGLIPTIN PHOSPHATE 100 MG PO TABS: 100.0000 mg | ORAL_TABLET | Freq: Every day | ORAL | 0 refills | Status: DC

## 2020-12-03 NOTE — Telephone Encounter (Signed)
Those labs are fine.  Ill send in refill of Januvia just enough till she gets back in here.  I accidentally sent it to both pharmacies she has on file.

## 2020-12-03 NOTE — Addendum Note (Signed)
Addended by: Brita Romp on: 12/03/2020 01:58 PM   Modules accepted: Orders

## 2020-12-03 NOTE — Telephone Encounter (Signed)
Pt is calling and requesting a refill on Januvia to [place]. Informed her she needed to make a follow up appt. Pt did.  Pt has not been seen since December. She did her blood work in April but did not call back to set up a follow up appt. She is scheduled for Aug 1. Does she need more blood work for this appt?

## 2020-12-21 NOTE — Patient Instructions (Signed)
Diabetes Mellitus and Nutrition, Adult When you have diabetes, or diabetes mellitus, it is very important to have healthy eating habits because your blood sugar (glucose) levels are greatly affected by what you eat and drink. Eating healthy foods in the right amounts, at about the same times every day, can help you:  Control your blood glucose.  Lower your risk of heart disease.  Improve your blood pressure.  Reach or maintain a healthy weight. What can affect my meal plan? Every person with diabetes is different, and each person has different needs for a meal plan. Your health care provider may recommend that you work with a dietitian to make a meal plan that is best for you. Your meal plan may vary depending on factors such as:  The calories you need.  The medicines you take.  Your weight.  Your blood glucose, blood pressure, and cholesterol levels.  Your activity level.  Other health conditions you have, such as heart or kidney disease. How do carbohydrates affect me? Carbohydrates, also called carbs, affect your blood glucose level more than any other type of food. Eating carbs naturally raises the amount of glucose in your blood. Carb counting is a method for keeping track of how many carbs you eat. Counting carbs is important to keep your blood glucose at a healthy level, especially if you use insulin or take certain oral diabetes medicines. It is important to know how many carbs you can safely have in each meal. This is different for every person. Your dietitian can help you calculate how many carbs you should have at each meal and for each snack. How does alcohol affect me? Alcohol can cause a sudden decrease in blood glucose (hypoglycemia), especially if you use insulin or take certain oral diabetes medicines. Hypoglycemia can be a life-threatening condition. Symptoms of hypoglycemia, such as sleepiness, dizziness, and confusion, are similar to symptoms of having too much  alcohol.  Do not drink alcohol if: ? Your health care provider tells you not to drink. ? You are pregnant, may be pregnant, or are planning to become pregnant.  If you drink alcohol: ? Do not drink on an empty stomach. ? Limit how much you use to:  0-1 drink a day for women.  0-2 drinks a day for men. ? Be aware of how much alcohol is in your drink. In the [place] one drink equals one 12 oz bottle of beer (355 mL), one 5 oz glass of wine (148 mL), or one 1 oz glass of hard liquor (44 mL). ? Keep yourself hydrated with water, diet soda, or unsweetened iced tea.  Keep in mind that regular soda, juice, and other mixers may contain a lot of sugar and must be counted as carbs. What are tips for following this plan? Reading food labels  Start by checking the serving size on the "Nutrition Facts" label of packaged foods and drinks. The amount of calories, carbs, fats, and other nutrients listed on the label is based on one serving of the item. Many items contain more than one serving per package.  Check the total grams (g) of carbs in one serving. You can calculate the number of servings of carbs in one serving by dividing the total carbs by 15. For example, if a food has 30 g of total carbs per serving, it would be equal to 2 servings of carbs.  Check the number of grams (g) of saturated fats and trans fats in one serving. Choose foods that have   a low amount or none of these fats.  Check the number of milligrams (mg) of salt (sodium) in one serving. Most people should limit total sodium intake to less than 2,300 mg per day.  Always check the nutrition information of foods labeled as "low-fat" or "nonfat." These foods may be higher in added sugar or refined carbs and should be avoided.  Talk to your dietitian to identify your daily goals for nutrients listed on the label. Shopping  Avoid buying canned, pre-made, or processed foods. These foods tend to be high in fat, sodium, and added  sugar.  Shop around the outside edge of the grocery store. This is where you will most often find fresh fruits and vegetables, bulk grains, fresh meats, and fresh dairy. Cooking  Use low-heat cooking methods, such as baking, instead of high-heat cooking methods like deep frying.  Cook using healthy oils, such as olive, canola, or sunflower oil.  Avoid cooking with butter, cream, or high-fat meats. Meal planning  Eat meals and snacks regularly, preferably at the same times every day. Avoid going long periods of time without eating.  Eat foods that are high in fiber, such as fresh fruits, vegetables, beans, and whole grains. Talk with your dietitian about how many servings of carbs you can eat at each meal.  Eat 4-6 oz (112-168 g) of lean protein each day, such as lean meat, chicken, fish, eggs, or tofu. One ounce (oz) of lean protein is equal to: ? 1 oz (28 g) of meat, chicken, or fish. ? 1 egg. ?  cup (62 g) of tofu.  Eat some foods each day that contain healthy fats, such as avocado, nuts, seeds, and fish.   What foods should I eat? Fruits Berries. Apples. Oranges. Peaches. Apricots. Plums. Grapes. Mango. Papaya. Pomegranate. Kiwi. Cherries. Vegetables Lettuce. Spinach. Leafy greens, including kale, chard, collard greens, and mustard greens. Beets. Cauliflower. Cabbage. Broccoli. Carrots. Green beans. Tomatoes. Peppers. Onions. Cucumbers. Brussels sprouts. Grains Whole grains, such as whole-wheat or whole-grain bread, crackers, tortillas, cereal, and pasta. Unsweetened oatmeal. Quinoa. Brown or wild rice. Meats and other proteins Seafood. Poultry without skin. Lean cuts of poultry and beef. Tofu. Nuts. Seeds. Dairy Low-fat or fat-free dairy products such as milk, yogurt, and cheese. The items listed above may not be a complete list of foods and beverages you can eat. Contact a dietitian for more information. What foods should I avoid? Fruits Fruits canned with  syrup. Vegetables Canned vegetables. Frozen vegetables with butter or cream sauce. Grains Refined white flour and flour products such as bread, pasta, snack foods, and cereals. Avoid all processed foods. Meats and other proteins Fatty cuts of meat. Poultry with skin. Breaded or fried meats. Processed meat. Avoid saturated fats. Dairy Full-fat yogurt, cheese, or milk. Beverages Sweetened drinks, such as soda or iced tea. The items listed above may not be a complete list of foods and beverages you should avoid. Contact a dietitian for more information. Questions to ask a health care provider  Do I need to meet with a diabetes educator?  Do I need to meet with a dietitian?  What number can I call if I have questions?  When are the best times to check my blood glucose? Where to find more information:  American Diabetes Association: diabetes.org  Academy of Nutrition and Dietetics: www.eatright.org  National Institute of Diabetes and Digestive and Kidney Diseases: www.niddk.nih.gov  Association of Diabetes Care and Education Specialists: www.diabeteseducator.org Summary  It is important to have healthy eating   habits because your blood sugar (glucose) levels are greatly affected by what you eat and drink.  A healthy meal plan will help you control your blood glucose and maintain a healthy lifestyle.  Your health care provider may recommend that you work with a dietitian to make a meal plan that is best for you.  Keep in mind that carbohydrates (carbs) and alcohol have immediate effects on your blood glucose levels. It is important to count carbs and to use alcohol carefully. This information is not intended to replace advice given to you by your health care provider. Make sure you discuss any questions you have with your health care provider. Document Revised: 04/19/2019 Document Reviewed: 04/19/2019 Elsevier Patient Education  2021 Elsevier Inc.  

## 2020-12-24 ENCOUNTER — Ambulatory Visit (INDEPENDENT_AMBULATORY_CARE_PROVIDER_SITE_OTHER): Payer: Medicaid Other | Admitting: Nurse Practitioner

## 2020-12-24 ENCOUNTER — Encounter: Payer: Self-pay | Admitting: Nurse Practitioner

## 2020-12-24 ENCOUNTER — Other Ambulatory Visit: Payer: Self-pay

## 2020-12-24 VITALS — BP 119/80 | HR 90 | Ht 63.0 in | Wt 143.4 lb

## 2020-12-24 DIAGNOSIS — E11311 Type 2 diabetes mellitus with unspecified diabetic retinopathy with macular edema: Secondary | ICD-10-CM

## 2020-12-24 DIAGNOSIS — I10 Essential (primary) hypertension: Secondary | ICD-10-CM | POA: Diagnosis not present

## 2020-12-24 DIAGNOSIS — Z794 Long term (current) use of insulin: Secondary | ICD-10-CM

## 2020-12-24 DIAGNOSIS — E782 Mixed hyperlipidemia: Secondary | ICD-10-CM | POA: Diagnosis not present

## 2020-12-24 LAB — POCT GLYCOSYLATED HEMOGLOBIN (HGB A1C): HbA1c, POC (controlled diabetic range): 7.4 % — AB (ref 0.0–7.0)

## 2020-12-24 MED ORDER — SITAGLIPTIN PHOSPHATE 100 MG PO TABS: 100.0000 mg | ORAL_TABLET | Freq: Every day | ORAL | 3 refills | Status: DC

## 2020-12-24 MED ORDER — GLIPIZIDE ER 5 MG PO TB24: ORAL_TABLET | ORAL | 3 refills | Status: DC

## 2020-12-24 MED ORDER — LANTUS SOLOSTAR 100 UNIT/ML ~~LOC~~ SOPN: 12.0000 [IU] | PEN_INJECTOR | Freq: Every day | SUBCUTANEOUS | 2 refills | Status: DC

## 2020-12-24 NOTE — Progress Notes (Signed)
12/24/2020   Endocrinology Follow Up Visit   Subjective:    Patient ID: Allison [name], female    DOB: [birth year], PCP [name], MD  Patient presents today for follow up and management of type 2 diabetes, hyperlipidemia, and hypertension. Her PCP is Dr. [name].  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   Past Surgical History:  Procedure Laterality Date   ABDOMINAL HYSTERECTOMY     partial    EYE SURGERY     multiple   RADIOLOGY WITH ANESTHESIA N/A 07/07/2017   Procedure: MRI WITH ANESTHESIA,BRAIN WITHOUT CONTRAST;  Surgeon: Radiologist, Medication, MD;  Location: [facility];  Service: Radiology;  Laterality: N/A;   Social History   Socioeconomic History   Marital status: Divorced    Spouse name: Not on file   Number of children: Not on file   Years of education: Not on file   Highest education level: Not on file  Occupational History   Not on file  Tobacco Use   Smoking status: Former    Packs/day: 0.25    Years: 26.00    Pack years: 6.50    Types: Cigarettes    Quit date: 05/05/2014    Years since quitting: 6.6   Smokeless tobacco: Never  Vaping Use   Vaping Use: Never used  Substance and Sexual Activity   Alcohol use: No   Drug use: No   Sexual activity: Yes    Birth control/protection: Surgical  Other Topics Concern   Not on file  Social History Narrative   Not on file   Social Determinants of Health   Financial Resource Strain: Not on file  Food Insecurity: Not on file  Transportation Needs: Not on file  Physical Activity: Not on file  Stress: Not on file  Social Connections: Not on file   Outpatient Encounter Medications as of 12/24/2020  Medication Sig   ACCU-CHEK AVIVA PLUS test strip USE AS DIRECTED  UP TO [facility].   albuterol (PROAIR HFA) 108 (90 BASE) MCG/ACT inhaler Inhale 2 puffs into the lungs every 4 (four) hours as needed for wheezing or shortness of breath.   ALPHAGAN P 0.1 % SOLN Place 1 drop into both eyes every 8 (eight) hours.    ALPRAZolam (XANAX) 1 MG tablet Take 1 mg by mouth 3 (three) times daily as needed.   amphetamine-dextroamphetamine (ADDERALL) 20 MG tablet Take 20 mg by mouth 3 (three) times daily.   aspirin EC 81 MG tablet Take 81 mg by mouth daily.   azelastine (ASTELIN) 0.1 % nasal spray Place 2 sprays into both nostrils 2 (two) times daily.   blood glucose meter kit and supplies KIT Dispense based on patient and insurance preference. Use up to four times daily as directed. (FOR ICD 10 E11.65).   conjugated estrogens (PREMARIN) vaginal cream Premarin 0.625 mg/gram vaginal cream  PLACE A FOURTH AN APPLICATORFUL APPLICATORFUL VAGINALLY 3 TIMES A WEEK.   dorzolamide (TRUSOPT) 2 % ophthalmic solution dorzolamide 2 % eye drops  INSTILL 1 DROP INTO BOTH EYES 3 TIMES A DAY   furosemide (LASIX) 40 MG tablet ORAL TAKE 1 TABLET IF NEEDED FOR  FLUID.   glipiZIDE (GLUCOTROL XL) 5 MG 24 hr tablet TAKE (1) TABLET BY MOUTH EACH MORNING WITH BREAKFAST.   HYDROcodone-acetaminophen (NORCO) 7.5-325 MG tablet Take 1 tablet by mouth every 4 (four) hours as needed for severe pain.   ibuprofen (ADVIL) 800 MG tablet ibuprofen 800 mg tablet  TAKE 1 TABLET BY MOUTH EVERY 8 HOURS AS NEEDED (Patient not taking: Reported on 12/24/2020)   insulin glargine (LANTUS SOLOSTAR) 100 UNIT/ML Solostar Pen Inject 12 Units into the skin at bedtime.   Insulin Pen Needle (B-D ULTRAFINE III SHORT PEN) 31G X 8 MM MISC 1 each by Does not apply route as directed.   LINZESS 145 MCG CAPS capsule Take 145 mcg by mouth every morning.   lisinopril (ZESTRIL) 5 MG tablet Take 1 tablet (5 mg total) by mouth daily.   loratadine (CLARITIN) 10 MG tablet Take 10 mg by mouth daily.   Melatonin 5 MG TABS Take 1 tablet  by mouth at bedtime.   Multiple Vitamin (MULTIVITAMIN) tablet Take 2 tablets by mouth daily.    ofloxacin (OCUFLOX) 0.3 % ophthalmic solution ofloxacin 0.3 % eye drops  INSTILL 1 DROP INTO LEFT EYE 3 TIMES A DAY   omeprazole (PRILOSEC) 20 MG capsule Take 20 mg by mouth 2 (two) times daily as needed.    phenylephrine (SUDAFED PE) 10 MG TABS tablet Take 10 mg by mouth every 4 (four) hours as needed.   polyethylene glycol (MIRALAX / GLYCOLAX) packet Take 17 g by mouth daily. (Patient not taking: Reported on 12/24/2020)   potassium chloride SA (K-DUR,KLOR-CON) 20 MEQ tablet Take 20 mEq by mouth 2 (two) times a week.    rosuvastatin (CRESTOR) 5 MG tablet TAKE ONE TABLET BY MOUTH ONCE DAILY.   sitaGLIPtin (JANUVIA) 100 MG tablet Take 1 tablet (100 mg total) by mouth daily.   [DISCONTINUED] glipiZIDE (GLUCOTROL XL) 5 MG 24 hr tablet TAKE (1) TABLET BY MOUTH EACH MORNING WITH BREAKFAST.   [DISCONTINUED] LANTUS SOLOSTAR 100 UNIT/ML Solostar Pen INJECT 20 UNITS SUBCUTANEOUSLY AT BEDTIME   [DISCONTINUED] sitaGLIPtin (JANUVIA) 100 MG tablet Take 1 tablet (100 mg total) by mouth daily.   Facility-Administered Encounter Medications as of 12/24/2020  Medication   triamcinolone acetonide (TRIESENCE) 40 MG/ML subtenons injection 4 mg   triamcinolone acetonide (TRIESENCE) 40 MG/ML subtenons injection 4 mg   ALLERGIES: Allergies  Allergen Reactions   Cymbalta [Duloxetine Hcl] Shortness Of Breath   Beta Adrenergic Blockers Palpitations   Codeine Itching, Dermatitis and Other (See Comments)    TYLENOL #3   [place] and itching   Lexapro [Escitalopram Oxalate] Hives and Palpitations   Other Palpitations    Allergic to all beta blocker eye drops!!   Victoza [Liraglutide] Nausea And Vomiting   Doxycycline Other (See Comments)    UNSPECIFIED Non-specific Heart problems   Lyrica [Pregabalin] Palpitations   Oxycodone Itching   VACCINATION STATUS:  There is no immunization history on file for this  patient.  Diabetes She presents for her follow-up diabetic visit. She has type 2 diabetes mellitus. Onset time: She was diagnosed at approximate age of 57 years. Her disease course has been improving. Hypoglycemia symptoms include nervousness/anxiousness and tremors. Pertinent negatives for hypoglycemia include no confusion, headaches, pallor or sweats. Associated symptoms include weight loss. Pertinent negatives for diabetes include no chest pain, no polydipsia, no polyphagia and no polyuria. Hypoglycemia complications include nocturnal hypoglycemia. Symptoms are stable. Diabetic complications include peripheral neuropathy and retinopathy. Risk factors for coronary artery disease include diabetes mellitus, dyslipidemia, hypertension,  tobacco exposure and sedentary lifestyle. Current diabetic treatment includes insulin injections and oral agent (dual therapy). She is compliant with treatment most of the time. Her weight is increasing steadily. She is following a generally healthy diet. When asked about meal planning, she reported none. She has not had a previous visit with a dietitian. She never participates in exercise. Her home blood glucose trend is decreasing steadily. Her overall blood glucose range is 110-130 mg/dl. (She presents today with her meter, no logs, showing improved glycemic profile overall.  Her POCT A1c today is 7.4% improving from last visit of 8.3%.  She does have some nocturnal hypoglycemia documented, likely related to carb quantity at supper.  She also reports she has been working out more with her mother.) An ACE inhibitor/angiotensin II receptor blocker is being taken. She does not see a podiatrist.Eye exam is current.  Hyperlipidemia This is a chronic problem. The current episode started more than 1 year ago. The problem is controlled. Recent lipid tests were reviewed and are normal. Exacerbating diseases include chronic renal disease and diabetes. Factors aggravating her  hyperlipidemia include fatty foods and smoking. Pertinent negatives include no chest pain, myalgias or shortness of breath. Current antihyperlipidemic treatment includes statins. The current treatment provides mild improvement of lipids. There are no compliance problems.  Risk factors for coronary artery disease include diabetes mellitus, dyslipidemia, hypertension and a sedentary lifestyle.  Hypertension This is a chronic problem. The current episode started more than 1 year ago. The problem has been rapidly improving since onset. The problem is controlled. Pertinent negatives include no chest pain, headaches, palpitations, shortness of breath or sweats. There are no associated agents to hypertension. Risk factors for coronary artery disease include dyslipidemia, diabetes mellitus, obesity, sedentary lifestyle, smoking/tobacco exposure and stress. Past treatments include diuretics and ACE inhibitors (Lasix as needed for swelling). The current treatment provides significant improvement. Compliance problems include diet and exercise.  Hypertensive end-organ damage includes kidney disease and retinopathy. Identifiable causes of hypertension include chronic renal disease.    Review of systems  Constitutional: + Minimally fluctuating body weight,  current Body mass index is 25.4 kg/m. , no fatigue, no subjective hyperthermia, no subjective hypothermia Eyes: no blurry vision, no xerophthalmia ENT: no sore throat, no nodules palpated in throat, no dysphagia/odynophagia, no hoarseness Cardiovascular: no chest pain, no shortness of breath, no palpitations, no leg swelling Respiratory: no cough, no shortness of breath Gastrointestinal: no nausea/vomiting/diarrhea Musculoskeletal: no muscle/joint aches Skin: no rashes, no hyperemia Neurological: no tremors, +numbness/tingling to BLE, no dizziness Psychiatric: no depression, no anxiety   Objective:    BP 119/80   Pulse 90   Ht _0  (1.6 m)   Wt 143 lb  6.4 oz (65 kg)   BMI 25.40 kg/m   Wt Readings from Last 3 Encounters:  12/24/20 143 lb 6.4 oz (65 kg)  05/02/20 154 lb (69.9 kg)  12/22/19 148 lb 9.6 oz (67.4 kg)    BP Readings from Last 3 Encounters:  12/24/20 119/80  05/02/20 (!) 168/93  12/22/19 (!) 155/94      Physical Exam- Limited  Constitutional:  Body mass index is 25.4 kg/m. , not in acute distress, normal state of mind Eyes:  EOMI, no exophthalmos Neck: Supple Cardiovascular: RRR, no murmurs, rubs, or gallops, no edema Respiratory: Adequate breathing efforts, no crackles, rales, rhonchi, or wheezing Musculoskeletal: no gross deformities, strength intact in all four extremities, no gross restriction of joint movements Skin:  no rashes, no hyperemia Neurological: no tremor  with outstretched hands   Foot exam:  No rashes, ulcers, cuts, calluses, onychodystrophy.  Good pulses bilat. Decreased sensation to 10 g monofilament bilat.   Results for orders placed or performed in visit on 12/24/20  HgB A1c  Result Value Ref Range   Hemoglobin A1C     HbA1c POC (<> result, manual entry)     HbA1c, POC (prediabetic range)     HbA1c, POC (controlled diabetic range) 7.4 (A) 0.0 - 7.0 %   Diabetic Labs (most recent): Lab Results  Component Value Date   HGBA1C 7.4 (A) 12/24/2020   HGBA1C 8.3 (A) 05/02/2020   HGBA1C 7.6 (A) 12/22/2019   Lipid Panel     Component Value Date/Time   CHOL 115 09/17/2020 1106   TRIG 98 09/17/2020 1106   HDL 48 09/17/2020 1106   CHOLHDL 2.4 09/17/2020 1106   LDLCALC 49 09/17/2020 1106     Assessment & Plan:   1) 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  Her POCT A1c today is 7.4% improving from last visit of 8.3%.  She does have some nocturnal hypoglycemia documented, likely  related to carb quantity at supper.  She also reports she has been working out more with her mother.  -Her diabetes is complicated by advanced retinopathy and patient remains at a high risk for more acute and chronic complications of diabetes which include CAD, CVA, CKD, retinopathy, and neuropathy. These are all discussed in detail with the patient.  - Nutritional counseling repeated at each appointment due to patients tendency to fall back in to old habits.  - The patient admits there is a room for improvement in their diet and drink choices. -  Suggestion is made for the patient to avoid simple carbohydrates from their diet including Cakes, Sweet Desserts / Pastries, Ice Cream, Soda (diet and regular), Sweet Tea, Candies, Chips, Cookies, Sweet Pastries, Store Bought Juices, Alcohol in Excess of 1-2 drinks a day, Artificial Sweeteners, Coffee Creamer, and "Sugar-free" Products. This will help patient to have stable blood glucose profile and potentially avoid unintended weight gain.   - I encouraged the patient to switch to unprocessed or minimally processed complex starch and increased protein intake (animal or plant source), fruits, and vegetables.   - Patient is advised to stick to a routine mealtimes to eat 3 meals a day and avoid unnecessary snacks (to snack only to correct hypoglycemia).  - I have approached patient with the following individualized plan to manage diabetes and patient agrees.  -Based on her nocturnal hypoglycemia, she is advised to lower her dose of Lantus to 12 units SQ nightly.  She can continue her Januvia 100 mg po daily and her Glipizide 5 mg XL daily with breakfast for now.  -She is encouraged to continue monitoring blood glucose twice daily, before breakfast and before bed, and to call the clinic if she has readings less than 70 or greater than 200 for 3 tests in a row.  -She did not tolerate Metformin previously.   2) BP/HTN:  Her blood pressure is controlled to  target.  She is advised to continue Lasix 40 mg po daily as needed for swelling and Lisinopril 5 mg daily.  3) Lipids/HPL:  Her recent lipid panel from 09/17/20, shows controlled LDL at 49.  She is advised to continue Crestor 5  mg p.o. nightly.   Side effects and precautions discussed with her.   4) Chronic Care/Health Maintenance: -Patient is encouraged to continue to follow up with Ophthalmology, Podiatrist at least yearly or according to recommendations, and advised to stay away from smoking (quit approximately 1 year ago). I have recommended yearly flu vaccine and pneumonia vaccination at least every 5 years; moderate intensity exercise for up to 150 minutes weekly; and  sleep for at least 7 hours a day.    I spent 32 minutes in the care of the patient today including review of labs from [facility], Lipids, Thyroid Function, Hematology (current and previous including abstractions from other facilities); face-to-face time discussing  her blood glucose readings/logs, discussing hypoglycemia and hyperglycemia episodes and symptoms, medications doses, her options of short and long term treatment based on the latest standards of care / guidelines;  discussion about incorporating lifestyle medicine;  and documenting the encounter.    Please refer to Patient Instructions for Blood Glucose Monitoring and Insulin/Medications Dosing Guide"  in media tab for additional information. Please  also refer to " Patient Self Inventory" in the Media  tab for reviewed elements of pertinent patient history.  Allison [name] participated in the discussions, expressed understanding, and voiced agreement with the above plans.  All questions were answered to her satisfaction. she is encouraged to contact clinic should she have any questions or concerns prior to her return visit.   Follow up plan: -Return in about 4 months (around 04/25/2021) for Diabetes F/U- A1c and UM in office, No previsit labs, Bring meter and  logs.   Allison [name], [facility] [address] [place], [place] [address] Phone: [phone] Fax: [phone]  12/24/2020, 10:02 AM

## 2020-12-25 ENCOUNTER — Other Ambulatory Visit: Payer: Self-pay | Admitting: Nurse Practitioner

## 2020-12-25 DIAGNOSIS — E11311 Type 2 diabetes mellitus with unspecified diabetic retinopathy with macular edema: Secondary | ICD-10-CM

## 2021-02-05 ENCOUNTER — Other Ambulatory Visit: Payer: Self-pay | Admitting: Nurse Practitioner

## 2021-04-08 ENCOUNTER — Other Ambulatory Visit: Payer: Self-pay | Admitting: Nurse Practitioner

## 2021-04-08 DIAGNOSIS — Z794 Long term (current) use of insulin: Secondary | ICD-10-CM

## 2021-04-08 DIAGNOSIS — E11311 Type 2 diabetes mellitus with unspecified diabetic retinopathy with macular edema: Secondary | ICD-10-CM

## 2021-04-24 NOTE — Patient Instructions (Incomplete)

## 2021-04-25 ENCOUNTER — Ambulatory Visit: Payer: Medicaid Other | Admitting: Nurse Practitioner

## 2021-04-25 DIAGNOSIS — I10 Essential (primary) hypertension: Secondary | ICD-10-CM

## 2021-04-25 DIAGNOSIS — E782 Mixed hyperlipidemia: Secondary | ICD-10-CM

## 2021-04-25 DIAGNOSIS — E11311 Type 2 diabetes mellitus with unspecified diabetic retinopathy with macular edema: Secondary | ICD-10-CM

## 2021-05-20 ENCOUNTER — Other Ambulatory Visit: Payer: Self-pay | Admitting: Nurse Practitioner

## 2021-07-05 ENCOUNTER — Other Ambulatory Visit: Payer: Self-pay | Admitting: Nurse Practitioner

## 2021-07-05 ENCOUNTER — Encounter (INDEPENDENT_AMBULATORY_CARE_PROVIDER_SITE_OTHER): Payer: Self-pay | Admitting: *Deleted

## 2021-08-08 ENCOUNTER — Other Ambulatory Visit: Payer: Self-pay | Admitting: Nurse Practitioner

## 2021-08-16 ENCOUNTER — Other Ambulatory Visit: Payer: Self-pay | Admitting: "Endocrinology

## 2021-09-30 ENCOUNTER — Other Ambulatory Visit: Payer: Self-pay | Admitting: Nurse Practitioner

## 2021-10-01 ENCOUNTER — Telehealth: Payer: Self-pay | Admitting: Nurse Practitioner

## 2021-10-01 DIAGNOSIS — E11311 Type 2 diabetes mellitus with unspecified diabetic retinopathy with macular edema: Secondary | ICD-10-CM

## 2021-10-01 MED ORDER — SITAGLIPTIN PHOSPHATE 100 MG PO TABS: 100.0000 mg | ORAL_TABLET | Freq: Every day | ORAL | 3 refills | Status: AC

## 2021-10-01 MED ORDER — ROSUVASTATIN CALCIUM 5 MG PO TABS: 5.0000 mg | ORAL_TABLET | Freq: Every day | ORAL | 3 refills | Status: AC

## 2021-10-01 MED ORDER — GLIPIZIDE ER 5 MG PO TB24: ORAL_TABLET | ORAL | 3 refills | Status: DC

## 2021-10-01 MED ORDER — LISINOPRIL 5 MG PO TABS: 5.0000 mg | ORAL_TABLET | Freq: Every day | ORAL | 3 refills | Status: AC

## 2021-10-01 MED ORDER — LANTUS SOLOSTAR 100 UNIT/ML ~~LOC~~ SOPN: 12.0000 [IU] | PEN_INJECTOR | Freq: Every day | SUBCUTANEOUS | 2 refills | Status: AC

## 2021-10-01 NOTE — Telephone Encounter (Signed)
Patient requesting a refill on her Lisinopril, Crestor, Lantus, Januvia & Lisinopril. Patient uses [facility]. She made an appt for next week. She missed her last appt in december ?

## 2021-10-01 NOTE — Telephone Encounter (Signed)
Done, sent in refill for all of those.

## 2021-10-09 ENCOUNTER — Ambulatory Visit: Payer: Medicaid Other | Admitting: Nurse Practitioner

## 2021-10-09 NOTE — Patient Instructions (Incomplete)
Diabetes Mellitus and Foot Care Foot care is an important part of your health, especially when you have diabetes. Diabetes may cause you to have problems because of poor blood flow (circulation) to your feet and legs, which can cause your skin to: Become thinner and drier. Break more easily. Heal more slowly. Peel and crack. You may also have nerve damage (neuropathy) in your legs and feet, causing decreased feeling in them. This means that you may not notice minor injuries to your feet that could lead to more serious problems. Noticing and addressing any potential problems early is the best way to prevent future foot problems. How to care for your feet Foot hygiene  Wash your feet daily with warm water and mild soap. Do not use hot water. Then, pat your feet and the areas between your toes until they are completely dry. Do not soak your feet as this can dry your skin. Trim your toenails straight across. Do not dig under them or around the cuticle. File the edges of your nails with an emery board or nail file. Apply a moisturizing lotion or petroleum jelly to the skin on your feet and to dry, brittle toenails. Use lotion that does not contain alcohol and is unscented. Do not apply lotion between your toes. Shoes and socks Wear clean socks or stockings every day. Make sure they are not too tight. Do not wear knee-high stockings since they may decrease blood flow to your legs. Wear shoes that fit properly and have enough cushioning. Always look in your shoes before you put them on to be sure there are no objects inside. To break in new shoes, wear them for just a few hours a day. This prevents injuries on your feet. Wounds, scrapes, corns, and calluses  Check your feet daily for blisters, cuts, bruises, sores, and redness. If you cannot see the bottom of your feet, use a mirror or ask someone for help. Do not cut corns or calluses or try to remove them with medicine. If you find a minor scrape,  cut, or break in the skin on your feet, keep it and the skin around it clean and dry. You may clean these areas with mild soap and water. Do not clean the area with peroxide, alcohol, or iodine. If you have a wound, scrape, corn, or callus on your foot, look at it several times a day to make sure it is healing and not infected. Check for: Redness, swelling, or pain. Fluid or blood. Warmth. Pus or a bad smell. General tips Do not cross your legs. This may decrease blood flow to your feet. Do not use heating pads or hot water bottles on your feet. They may burn your skin. If you have lost feeling in your feet or legs, you may not know this is happening until it is too late. Protect your feet from hot and cold by wearing shoes, such as at the beach or on hot pavement. Schedule a complete foot exam at least once a year (annually) or more often if you have foot problems. Report any cuts, sores, or bruises to your health care provider immediately. Where to find more information [employer] Diabetes Association: www.diabetes.org [employer]: www.diabeteseducator.org Contact a health care provider if: You have a medical condition that increases your risk of infection and you have any cuts, sores, or bruises on your feet. You have an injury that is not healing. You have redness on your legs or feet. You   feel burning or tingling in your legs or feet. You have pain or cramps in your legs and feet. Your legs or feet are numb. Your feet always feel cold. You have pain around any toenails. Get help right away if: You have a wound, scrape, corn, or callus on your foot and: You have pain, swelling, or redness that gets worse. You have fluid or blood coming from the wound, scrape, corn, or callus. Your wound, scrape, corn, or callus feels warm to the touch. You have pus or a bad smell coming from the wound, scrape, corn, or callus. You have a fever. You have a red  line going up your leg. Summary Check your feet every day for blisters, cuts, bruises, sores, and redness. Apply a moisturizing lotion or petroleum jelly to the skin on your feet and to dry, brittle toenails. Wear shoes that fit properly and have enough cushioning. If you have foot problems, report any cuts, sores, or bruises to your health care provider immediately. Schedule a complete foot exam at least once a year (annually) or more often if you have foot problems. This information is not intended to replace advice given to you by your health care provider. Make sure you discuss any questions you have with your health care provider. Document Revised: 12/01/2019 Document Reviewed: 12/01/2019 [employer] Patient Education  2023 [employer]  

## 2021-10-31 ENCOUNTER — Ambulatory Visit: Payer: Medicaid Other | Admitting: Nurse Practitioner

## 2021-10-31 NOTE — Patient Instructions (Incomplete)
Diabetes Mellitus and Foot Care Foot care is an important part of your health, especially when you have diabetes. Diabetes may cause you to have problems because of poor blood flow (circulation) to your feet and legs, which can cause your skin to: Become thinner and drier. Break more easily. Heal more slowly. Peel and crack. You may also have nerve damage (neuropathy) in your legs and feet, causing decreased feeling in them. This means that you may not notice minor injuries to your feet that could lead to more serious problems. Noticing and addressing any potential problems early is the best way to prevent future foot problems. How to care for your feet Foot hygiene  Wash your feet daily with warm water and mild soap. Do not use hot water. Then, pat your feet and the areas between your toes until they are completely dry. Do not soak your feet as this can dry your skin. Trim your toenails straight across. Do not dig under them or around the cuticle. File the edges of your nails with an emery board or nail file. Apply a moisturizing lotion or petroleum jelly to the skin on your feet and to dry, brittle toenails. Use lotion that does not contain alcohol and is unscented. Do not apply lotion between your toes. Shoes and socks Wear clean socks or stockings every day. Make sure they are not too tight. Do not wear knee-high stockings since they may decrease blood flow to your legs. Wear shoes that fit properly and have enough cushioning. Always look in your shoes before you put them on to be sure there are no objects inside. To break in new shoes, wear them for just a few hours a day. This prevents injuries on your feet. Wounds, scrapes, corns, and calluses  Check your feet daily for blisters, cuts, bruises, sores, and redness. If you cannot see the bottom of your feet, use a mirror or ask someone for help. Do not cut corns or calluses or try to remove them with medicine. If you find a minor scrape,  cut, or break in the skin on your feet, keep it and the skin around it clean and dry. You may clean these areas with mild soap and water. Do not clean the area with peroxide, alcohol, or iodine. If you have a wound, scrape, corn, or callus on your foot, look at it several times a day to make sure it is healing and not infected. Check for: Redness, swelling, or pain. Fluid or blood. Warmth. Pus or a bad smell. General tips Do not cross your legs. This may decrease blood flow to your feet. Do not use heating pads or hot water bottles on your feet. They may burn your skin. If you have lost feeling in your feet or legs, you may not know this is happening until it is too late. Protect your feet from hot and cold by wearing shoes, such as at the beach or on hot pavement. Schedule a complete foot exam at least once a year (annually) or more often if you have foot problems. Report any cuts, sores, or bruises to your health care provider immediately. Where to find more information [employer] Diabetes Association: www.diabetes.org [employer]: www.diabeteseducator.org Contact a health care provider if: You have a medical condition that increases your risk of infection and you have any cuts, sores, or bruises on your feet. You have an injury that is not healing. You have redness on your legs or feet. You   feel burning or tingling in your legs or feet. You have pain or cramps in your legs and feet. Your legs or feet are numb. Your feet always feel cold. You have pain around any toenails. Get help right away if: You have a wound, scrape, corn, or callus on your foot and: You have pain, swelling, or redness that gets worse. You have fluid or blood coming from the wound, scrape, corn, or callus. Your wound, scrape, corn, or callus feels warm to the touch. You have pus or a bad smell coming from the wound, scrape, corn, or callus. You have a fever. You have a red  line going up your leg. Summary Check your feet every day for blisters, cuts, bruises, sores, and redness. Apply a moisturizing lotion or petroleum jelly to the skin on your feet and to dry, brittle toenails. Wear shoes that fit properly and have enough cushioning. If you have foot problems, report any cuts, sores, or bruises to your health care provider immediately. Schedule a complete foot exam at least once a year (annually) or more often if you have foot problems. This information is not intended to replace advice given to you by your health care provider. Make sure you discuss any questions you have with your health care provider. Document Revised: 12/01/2019 Document Reviewed: 12/01/2019 [employer] Patient Education  2023 [employer]  

## 2021-12-25 ENCOUNTER — Encounter (INDEPENDENT_AMBULATORY_CARE_PROVIDER_SITE_OTHER): Payer: Self-pay | Admitting: *Deleted

## 2022-04-27 ENCOUNTER — Emergency Department (HOSPITAL_COMMUNITY)
Admission: EM | Admit: 2022-04-27 | Discharge: 2022-04-27 | Discharge status: Against Medical Advice | Payer: Medicaid Other | Source: Home / Self Care

## 2022-05-20 ENCOUNTER — Other Ambulatory Visit (HOSPITAL_COMMUNITY): Payer: Self-pay | Admitting: Nurse Practitioner

## 2022-05-20 DIAGNOSIS — Z1231 Encounter for screening mammogram for malignant neoplasm of breast: Secondary | ICD-10-CM

## 2022-05-27 ENCOUNTER — Other Ambulatory Visit: Payer: Self-pay | Admitting: "Endocrinology

## 2022-09-11 ENCOUNTER — Other Ambulatory Visit: Payer: Self-pay | Admitting: Nurse Practitioner

## 2022-12-29 ENCOUNTER — Other Ambulatory Visit: Payer: Self-pay | Admitting: Nurse Practitioner

## 2023-02-16 ENCOUNTER — Other Ambulatory Visit: Payer: Self-pay | Admitting: Nurse Practitioner

## 2023-02-18 ENCOUNTER — Other Ambulatory Visit: Payer: Self-pay | Admitting: Nurse Practitioner

## 2023-05-13 ENCOUNTER — Ambulatory Visit: Payer: Self-pay | Admitting: Internal Medicine

## 2023-05-21 ENCOUNTER — Ambulatory Visit: Payer: Medicaid Other | Admitting: Nurse Practitioner

## 2023-05-21 DIAGNOSIS — Z7984 Long term (current) use of oral hypoglycemic drugs: Secondary | ICD-10-CM

## 2023-05-21 DIAGNOSIS — Z794 Long term (current) use of insulin: Secondary | ICD-10-CM

## 2023-05-22 ENCOUNTER — Telehealth: Payer: Self-pay | Admitting: Nurse Practitioner

## 2023-05-22 NOTE — Telephone Encounter (Signed)
Pt missed appt on 05/21/23, called and lvm for pt to call office to get rescheduled.

## 2023-05-26 ENCOUNTER — Encounter: Payer: Self-pay | Admitting: Internal Medicine

## 2024-04-25 ENCOUNTER — Other Ambulatory Visit: Payer: Self-pay | Admitting: Nurse Practitioner
